# Patient Record
Sex: Female | Born: 1995
Health system: Southern US, Community
[De-identification: ages and names within clinical notes are randomized; demographics above are authoritative.]

## PROBLEM LIST (undated history)

## (undated) ENCOUNTER — Inpatient Hospital Stay (HOSPITAL_COMMUNITY): Payer: Self-pay

## (undated) DIAGNOSIS — F329 Major depressive disorder, single episode, unspecified: Secondary | ICD-10-CM

## (undated) DIAGNOSIS — IMO0002 Reserved for concepts with insufficient information to code with codable children: Secondary | ICD-10-CM

## (undated) DIAGNOSIS — F32A Depression, unspecified: Secondary | ICD-10-CM

## (undated) DIAGNOSIS — N730 Acute parametritis and pelvic cellulitis: Secondary | ICD-10-CM

## (undated) HISTORY — DX: Major depressive disorder, single episode, unspecified: F32.9

## (undated) HISTORY — DX: Acute parametritis and pelvic cellulitis: N73.0

## (undated) HISTORY — PX: NO PAST SURGERIES: SHX2092

## (undated) HISTORY — DX: Depression, unspecified: F32.A

## (undated) HISTORY — DX: Reserved for concepts with insufficient information to code with codable children: IMO0002

---
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
## 2014-02-09 ENCOUNTER — Inpatient Hospital Stay (HOSPITAL_COMMUNITY)
Admission: AD | Admit: 2014-02-09 | Discharge: 2014-02-10 | Disposition: A | Discharge status: Home / Self Care | Payer: Medicaid Other | Source: Ambulatory Visit | Attending: Obstetrics & Gynecology | Admitting: Obstetrics & Gynecology

## 2014-02-09 DIAGNOSIS — O479 False labor, unspecified: Secondary | ICD-10-CM | POA: Insufficient documentation

## 2014-02-10 ENCOUNTER — Encounter (HOSPITAL_COMMUNITY): Payer: Self-pay

## 2014-02-10 NOTE — MAU Note (Signed)
Prenatal care in [place], records sent to [facility] so she can delivery here. Contractions. Positive fetal movement. Denies vaginal bleeding or LOF. States GBS negative in office.

## 2014-02-10 NOTE — Discharge Instructions (Signed)
Braxton Hicks Contractions Pregnancy is commonly associated with contractions of the uterus throughout the pregnancy. Towards the end of pregnancy (32 to 34 weeks), these contractions (Braxton Hicks) can develop more often and may become more forceful. This is not true labor because these contractions do not result in opening (dilatation) and thinning of the cervix. They are sometimes difficult to tell apart from true labor because these contractions can be forceful and people have different pain tolerances. You should not feel embarrassed if you go to the hospital with false labor. Sometimes, the only way to tell if you are in true labor is for your caregiver to follow the changes in the cervix. How to tell the difference between true and false labor:  False labor.  The contractions of false labor are usually shorter, irregular and not as hard as those of true labor.  They are often felt in the front of the lower abdomen and in the groin.  They may leave with walking around or changing positions while lying down.  They get weaker and are shorter lasting as time goes on.  These contractions are usually irregular.  They do not usually become progressively stronger, regular and closer together as with true labor.  True labor.  Contractions in true labor last 30 to 70 seconds, become very regular, usually become more intense, and increase in frequency.  They do not go away with walking.  The discomfort is usually felt in the top of the uterus and spreads to the lower abdomen and low back.  True labor can be determined by your caregiver with an exam. This will show that the cervix is dilating and getting thinner. If there are no prenatal problems or other health problems associated with the pregnancy, it is completely safe to be sent home with false labor and await the onset of true labor. HOME CARE INSTRUCTIONS   Keep up with your usual exercises and instructions.  Take medications as  directed.  Keep your regular prenatal appointment.  Eat and drink lightly if you think you are going into labor.  If BH contractions are making you uncomfortable:  Change your activity position from lying down or resting to walking/walking to resting.  Sit and rest in a tub of warm water.  Drink 2 to 3 glasses of water. Dehydration may cause B-H contractions.  Do slow and deep breathing several times an hour. SEEK IMMEDIATE MEDICAL CARE IF:   Your contractions continue to become stronger, more regular, and closer together.  You have a gushing, burst or leaking of fluid from the vagina.  An oral temperature above 102 F (38.9 C) develops.  You have passage of blood-tinged mucus.  You develop vaginal bleeding.  You develop continuous belly (abdominal) pain.  You have low back pain that you never had before.  You feel the baby's head pushing down causing pelvic pressure.  The baby is not moving as much as it used to. Document Released: 09/11/2005 Document Revised: 12/04/2011 Document Reviewed: 06/23/2013 ExitCare Patient Information 2014 [employer].  Fetal Movement Counts Patient Name: __________________________________________________ Patient Due Date: ____________________ Performing a fetal movement count is highly recommended in high-risk pregnancies, but it is good for every pregnant woman to do. Your caregiver may ask you to start counting fetal movements at 28 weeks of the pregnancy. Fetal movements often increase:  After eating a full meal.  After physical activity.  After eating or drinking something sweet or cold.  At rest. Pay attention to when you feel   the baby is most active. This will help you notice a pattern of your baby's sleep and wake cycles and what factors contribute to an increase in fetal movement. It is important to perform a fetal movement count at the same time each day when your baby is normally most active.  HOW TO COUNT FETAL  MOVEMENTS 1. Find a quiet and comfortable area to sit or lie down on your left side. Lying on your left side provides the best blood and oxygen circulation to your baby. 2. Write down the day and time on a sheet of paper or in a journal. 3. Start counting kicks, flutters, swishes, rolls, or jabs in a 2 hour period. You should feel at least 10 movements within 2 hours. 4. If you do not feel 10 movements in 2 hours, wait 2 3 hours and count again. Look for a change in the pattern or not enough counts in 2 hours. SEEK MEDICAL CARE IF:  You feel less than 10 counts in 2 hours, tried twice.  There is no movement in over an hour.  The pattern is changing or taking longer each day to reach 10 counts in 2 hours.  You feel the baby is not moving as he or she usually does. Date: ____________ Movements: ____________ Start time: ____________ Finish time: ____________  Date: ____________ Movements: ____________ Start time: ____________ Finish time: ____________ Date: ____________ Movements: ____________ Start time: ____________ Finish time: ____________ Date: ____________ Movements: ____________ Start time: ____________ Finish time: ____________ Date: ____________ Movements: ____________ Start time: ____________ Finish time: ____________ Date: ____________ Movements: ____________ Start time: ____________ Finish time: ____________ Date: ____________ Movements: ____________ Start time: ____________ Finish time: ____________ Date: ____________ Movements: ____________ Start time: ____________ Finish time: ____________  Date: ____________ Movements: ____________ Start time: ____________ Finish time: ____________ Date: ____________ Movements: ____________ Start time: ____________ Finish time: ____________ Date: ____________ Movements: ____________ Start time: ____________ Finish time: ____________ Date: ____________ Movements: ____________ Start time: ____________ Finish time: ____________ Date: ____________  Movements: ____________ Start time: ____________ Finish time: ____________ Date: ____________ Movements: ____________ Start time: ____________ Finish time: ____________ Date: ____________ Movements: ____________ Start time: ____________ Finish time: ____________  Date: ____________ Movements: ____________ Start time: ____________ Finish time: ____________ Date: ____________ Movements: ____________ Start time: ____________ Finish time: ____________ Date: ____________ Movements: ____________ Start time: ____________ Finish time: ____________ Date: ____________ Movements: ____________ Start time: ____________ Finish time: ____________ Date: ____________ Movements: ____________ Start time: ____________ Finish time: ____________ Date: ____________ Movements: ____________ Start time: ____________ Finish time: ____________ Date: ____________ Movements: ____________ Start time: ____________ Finish time: ____________  Date: ____________ Movements: ____________ Start time: ____________ Finish time: ____________ Date: ____________ Movements: ____________ Start time: ____________ Finish time: ____________ Date: ____________ Movements: ____________ Start time: ____________ Finish time: ____________ Date: ____________ Movements: ____________ Start time: ____________ Finish time: ____________ Date: ____________ Movements: ____________ Start time: ____________ Finish time: ____________ Date: ____________ Movements: ____________ Start time: ____________ Finish time: ____________ Date: ____________ Movements: ____________ Start time: ____________ Finish time: ____________  Date: ____________ Movements: ____________ Start time: ____________ Finish time: ____________ Date: ____________ Movements: ____________ Start time: ____________ Finish time: ____________ Date: ____________ Movements: ____________ Start time: ____________ Finish time: ____________ Date: ____________ Movements: ____________ Start time:  ____________ Finish time: ____________ Date: ____________ Movements: ____________ Start time: ____________ Finish time: ____________ Date: ____________ Movements: ____________ Start time: ____________ Finish time: ____________ Date: ____________ Movements: ____________ Start time: ____________ Finish time: ____________  Date: ____________ Movements: ____________ Start time: ____________ Finish time: ____________ Date: ____________ Movements: ____________ Start   time: ____________ Finish time: ____________ Date: ____________ Movements: ____________ Start time: ____________ Finish time: ____________ Date: ____________ Movements: ____________ Start time: ____________ Finish time: ____________ Date: ____________ Movements: ____________ Start time: ____________ Finish time: ____________ Date: ____________ Movements: ____________ Start time: ____________ Finish time: ____________ Date: ____________ Movements: ____________ Start time: ____________ Finish time: ____________  Date: ____________ Movements: ____________ Start time: ____________ Finish time: ____________ Date: ____________ Movements: ____________ Start time: ____________ Finish time: ____________ Date: ____________ Movements: ____________ Start time: ____________ Finish time: ____________ Date: ____________ Movements: ____________ Start time: ____________ Finish time: ____________ Date: ____________ Movements: ____________ Start time: ____________ Finish time: ____________ Date: ____________ Movements: ____________ Start time: ____________ Finish time: ____________ Date: ____________ Movements: ____________ Start time: ____________ Finish time: ____________  Date: ____________ Movements: ____________ Start time: ____________ Finish time: ____________ Date: ____________ Movements: ____________ Start time: ____________ Finish time: ____________ Date: ____________ Movements: ____________ Start time: ____________ Finish time: ____________ Date:  ____________ Movements: ____________ Start time: ____________ Finish time: ____________ Date: ____________ Movements: ____________ Start time: ____________ Finish time: ____________ Date: ____________ Movements: ____________ Start time: ____________ Finish time: ____________ Document Released: 10/11/2006 Document Revised: 08/28/2012 Document Reviewed: 07/08/2012 Exit[employer] Patient Information 2014 [employer].  

## 2014-02-14 ENCOUNTER — Encounter (HOSPITAL_COMMUNITY): Payer: Self-pay | Admitting: *Deleted

## 2014-02-14 ENCOUNTER — Inpatient Hospital Stay (HOSPITAL_COMMUNITY)
Admission: AD | Admit: 2014-02-14 | Discharge: 2014-02-14 | Disposition: A | Discharge status: Home / Self Care | Payer: Medicaid Other | Source: Ambulatory Visit | Attending: Obstetrics & Gynecology | Admitting: Obstetrics & Gynecology

## 2014-02-14 DIAGNOSIS — O479 False labor, unspecified: Secondary | ICD-10-CM | POA: Insufficient documentation

## 2014-02-14 NOTE — MAU Note (Signed)
Thressa Sheller CNM notified of pt's repeat SVE with no change. Pt stable for d/c home.

## 2014-02-14 NOTE — Discharge Instructions (Signed)
Natural Childbirth °Natural childbirth is going through labor and delivery without any drugs to relieve pain. You also do not use fetal monitors, have a cesarean delivery, or get a sugical cut to enlarge the vaginal opening (episiotomy). With the help of a birthing professional (midwife), you will direct your own labor and delivery as you choose. °Many women chose natural childbirth because they feel more in control and in touch with their labor and delivery. They are also concerned about the medications affecting themselves and the baby. °Pregnant women with a high risk pregnancy should not attempt natural childbirth. It is better to deliver the infant in a hospital if an emergency situation arises. Sometimes, the caregiver has to intervene for the health and safety of the mother and infant. °TWO TECHNIQUES FOR NATURAL CHILDBIRTH:  °· The Lamaze method. This method teaches women that having a baby is normal, healthy, and natural. It also teaches the mother to take a neutral position regarding pain medication and anesthesia and to make an informed decision if and when it is right for them. °· The Bradley method (also called husband coached birth). This method teaches the father to be the birth coach and stresses a natural approach. It also encourages exercise and a balanced diet with good nutrition. The exercises teach relaxation and deep breathing techniques. However, there are also classes to prepare the parents for an emergency situation that may occur. °METHODS OF DEALING WITH LABOR PAIN AND DELIVERY: °· Meditation. °· Yoga. °· Hypnosis. °· Acupuncture. °· Massage. °· Changing positions (walking, rocking, showering, leaning on birth balls). °· Lying in warm water or a jacuzzi. °· Find an activity that keeps your mind off of the labor pain. °· Listen to soft music. °· Visual imagery (focus on a particular object). °BEFORE GOING INTO LABOR °· Be sure you and your spouse/partner are in agreement to have natural  childbirth. °· Decide if your caregiver or a midwife will deliver your baby. °· Decide if you will have your baby in the hospital, birthing center, or at home. °· If you have children, make plans to have someone to take care of them when you go to the hospital. °· Know the distance and the time it takes to go to the delivery center. Make a dry run to be sure. °· Have a bag packed with a night gown, bathrobe, and toiletries ready to take when you go into labor. °· Keep phone numbers of your family and friends handy if you need to call someone when you go into labor. °· Your spouse or partner should go to all the teaching classes. °· Talk with your caregiver about the possibility of a medical emergency and what will happen if that occurs. °ADVANTAGES OF NATURAL CHILDBIRTH °· You are in control of your labor and delivery. °· It is safe. °· There are no medications or anesthetics that may affect you and the fetus. °· There are no invasive procedures such as an episiotomy. °· You and your partner will work together, which can increase your bond. °· Meditation, yoga, massage, and breathing exercises can be learned while pregnant and help you when you are in labor and at delivery. °· In most delivery centers, the family and friends can be involved in the labor and delivery process. °DISADVANTAGES OF NATURAL CHILDBIRTH °· You will experience pain during your labor and delivery. °· The methods of helping relieve your labor pains may not work for you. °· You may feel embarrassed, disappointed, and like a failure   if you decide to change your mind during labor and not have natural childbirth. °AFTER THE DELIVERY °· You will be very tired. °· You will be uncomfortable because of your uterus contracting. You will feel soreness around the vagina. °· You may feel cold and shaky.This is a natural reaction. °· You will be excited, overwhelmed, accomplished, and proud to be a mother. °HOME CARE INSTRUCTIONS  °· Follow the advice and  instructions of your caregiver. °· Follow the instructions of your natural childbirth instructor (Lamaze or Bradley Method). °Document Released: 08/24/2008 Document Revised: 12/04/2011 Document Reviewed: 05/19/2013 °ExitCare® Patient Information ©2014 [employer]. ° °

## 2014-02-14 NOTE — MAU Note (Signed)
Contractions since 2100. Denies bleeding or leaking fld.

## 2014-02-14 NOTE — Progress Notes (Signed)
Dr Birdie Sons notified of pt's admission and status. Will see pt

## 2014-02-14 NOTE — Progress Notes (Signed)
Written and verbal d/c instructions given by Joana Reamer RN and understanding voiced.

## 2014-02-19 ENCOUNTER — Other Ambulatory Visit (HOSPITAL_COMMUNITY): Payer: Self-pay | Admitting: Primary Care

## 2014-02-19 DIAGNOSIS — O288 Other abnormal findings on antenatal screening of mother: Secondary | ICD-10-CM

## 2014-02-20 ENCOUNTER — Telehealth (HOSPITAL_COMMUNITY): Payer: Self-pay | Admitting: *Deleted

## 2014-02-20 LAB — OB RESULTS CONSOLE GBS: GBS: NEGATIVE

## 2014-02-20 NOTE — Telephone Encounter (Signed)
,  p

## 2014-02-25 ENCOUNTER — Ambulatory Visit (HOSPITAL_COMMUNITY): Admission: RE | Admit: 2014-02-25 | Payer: Medicaid Other | Source: Ambulatory Visit

## 2014-02-28 ENCOUNTER — Inpatient Hospital Stay (HOSPITAL_COMMUNITY)
Admission: RE | Admit: 2014-02-28 | Discharge: 2014-03-02 | DRG: 775 | Disposition: A | Discharge status: Home / Self Care | Payer: Medicaid Other | Source: Ambulatory Visit | Attending: Obstetrics & Gynecology | Admitting: Obstetrics & Gynecology

## 2014-02-28 ENCOUNTER — Encounter (HOSPITAL_COMMUNITY): Payer: Self-pay

## 2014-02-28 VITALS — BP 112/76 | HR 72 | Temp 98.2°F | Resp 16 | Ht 62.0 in | Wt 148.0 lb

## 2014-02-28 DIAGNOSIS — O328XX0 Maternal care for other malpresentation of fetus, not applicable or unspecified: Secondary | ICD-10-CM | POA: Diagnosis present

## 2014-02-28 DIAGNOSIS — Z3490 Encounter for supervision of normal pregnancy, unspecified, unspecified trimester: Secondary | ICD-10-CM

## 2014-02-28 DIAGNOSIS — O480 Post-term pregnancy: Secondary | ICD-10-CM | POA: Diagnosis present

## 2014-02-28 LAB — CBC
HEMATOCRIT: 36 % (ref 36.0–46.0)
Hemoglobin: 12.4 g/dL (ref 12.0–15.0)
MCH: 31.4 pg (ref 26.0–34.0)
MCHC: 34.4 g/dL (ref 30.0–36.0)
MCV: 91.1 fL (ref 78.0–100.0)
Platelets: 138 10*3/uL — ABNORMAL LOW (ref 150–400)
RBC: 3.95 MIL/uL (ref 3.87–5.11)
RDW: 13.2 % (ref 11.5–15.5)
WBC: 11.7 10*3/uL — AB (ref 4.0–10.5)

## 2014-02-28 LAB — TYPE AND SCREEN
ABO/RH(D): O POS
Antibody Screen: NEGATIVE

## 2014-02-28 LAB — RPR

## 2014-02-28 MED ORDER — ONDANSETRON HCL 4 MG/2ML IJ SOLN: 4.0000 mg | Freq: Four times a day (QID) | INTRAMUSCULAR | Status: DC | PRN

## 2014-02-28 MED ORDER — LACTATED RINGERS IV SOLN: 500.0000 mL | INTRAVENOUS | Status: DC | PRN

## 2014-02-28 MED ORDER — FLEET ENEMA 7-19 GM/118ML RE ENEM: 1.0000 | ENEMA | RECTAL | Status: DC | PRN

## 2014-02-28 MED ORDER — ONDANSETRON HCL 4 MG/2ML IJ SOLN: 4.0000 mg | INTRAMUSCULAR | Status: DC | PRN

## 2014-02-28 MED ORDER — LANOLIN HYDROUS EX OINT
TOPICAL_OINTMENT | CUTANEOUS | Status: DC | PRN
Start: 2014-02-28 — End: 2014-03-02

## 2014-02-28 MED ORDER — LACTATED RINGERS IV SOLN
INTRAVENOUS | Status: DC
  Administered 2014-02-28: 125 mL/h via INTRAVENOUS

## 2014-02-28 MED ORDER — SENNOSIDES-DOCUSATE SODIUM 8.6-50 MG PO TABS
2.0000 | ORAL_TABLET | ORAL | Status: DC
  Administered 2014-02-28 – 2014-03-01 (×2): 2 via ORAL
  Filled 2014-02-28 (×2): qty 2

## 2014-02-28 MED ORDER — CITRIC ACID-SODIUM CITRATE 334-500 MG/5ML PO SOLN: 30.0000 mL | ORAL | Status: DC | PRN

## 2014-02-28 MED ORDER — ACETAMINOPHEN 325 MG PO TABS: 650.0000 mg | ORAL_TABLET | ORAL | Status: DC | PRN

## 2014-02-28 MED ORDER — DIPHENHYDRAMINE HCL 25 MG PO CAPS: 25.0000 mg | ORAL_CAPSULE | Freq: Four times a day (QID) | ORAL | Status: DC | PRN

## 2014-02-28 MED ORDER — PRENATAL MULTIVITAMIN CH
1.0000 | ORAL_TABLET | Freq: Every day | ORAL | Status: DC
  Administered 2014-03-01 – 2014-03-02 (×2): 1 via ORAL
  Filled 2014-02-28 (×2): qty 1

## 2014-02-28 MED ORDER — LACTATED RINGERS IV BOLUS (SEPSIS): 300.0000 mL | Freq: Once | INTRAVENOUS | Status: DC

## 2014-02-28 MED ORDER — TERBUTALINE SULFATE 1 MG/ML IJ SOLN: 0.2500 mg | Freq: Once | INTRAMUSCULAR | Status: DC | PRN

## 2014-02-28 MED ORDER — LACTATED RINGERS IV SOLN
INTRAVENOUS | Status: DC
  Administered 2014-02-28: 150 mL/h via INTRAUTERINE

## 2014-02-28 MED ORDER — OXYTOCIN 40 UNITS IN LACTATED RINGERS INFUSION - SIMPLE MED
1.0000 m[IU]/min | INTRAVENOUS | Status: DC
  Administered 2014-02-28: 2 m[IU]/min via INTRAVENOUS
  Filled 2014-02-28: qty 1000

## 2014-02-28 MED ORDER — SIMETHICONE 80 MG PO CHEW: 80.0000 mg | CHEWABLE_TABLET | ORAL | Status: DC | PRN

## 2014-02-28 MED ORDER — ZOLPIDEM TARTRATE 5 MG PO TABS: 5.0000 mg | ORAL_TABLET | Freq: Every evening | ORAL | Status: DC | PRN

## 2014-02-28 MED ORDER — OXYCODONE-ACETAMINOPHEN 5-325 MG PO TABS
1.0000 | ORAL_TABLET | ORAL | Status: DC | PRN
  Administered 2014-03-01 (×4): 1 via ORAL
  Filled 2014-02-28 (×4): qty 1

## 2014-02-28 MED ORDER — OXYTOCIN BOLUS FROM INFUSION
500.0000 mL | INTRAVENOUS | Status: DC
  Administered 2014-02-28: 500 mL via INTRAVENOUS

## 2014-02-28 MED ORDER — IBUPROFEN 600 MG PO TABS: 600.0000 mg | ORAL_TABLET | Freq: Four times a day (QID) | ORAL | Status: DC | PRN

## 2014-02-28 MED ORDER — BENZOCAINE-MENTHOL 20-0.5 % EX AERO: 1.0000 "application " | INHALATION_SPRAY | CUTANEOUS | Status: DC | PRN

## 2014-02-28 MED ORDER — ONDANSETRON HCL 4 MG PO TABS: 4.0000 mg | ORAL_TABLET | ORAL | Status: DC | PRN

## 2014-02-28 MED ORDER — TETANUS-DIPHTH-ACELL PERTUSSIS 5-2.5-18.5 LF-MCG/0.5 IM SUSP: 0.5000 mL | Freq: Once | INTRAMUSCULAR | Status: DC

## 2014-02-28 MED ORDER — OXYCODONE-ACETAMINOPHEN 5-325 MG PO TABS: 1.0000 | ORAL_TABLET | ORAL | Status: DC | PRN

## 2014-02-28 MED ORDER — DIBUCAINE 1 % RE OINT: 1.0000 "application " | TOPICAL_OINTMENT | RECTAL | Status: DC | PRN

## 2014-02-28 MED ORDER — DEXTROSE 5 % IN LACTATED RINGERS IV BOLUS: 300.0000 mL | Freq: Once | INTRAVENOUS | Status: DC

## 2014-02-28 MED ORDER — WITCH HAZEL-GLYCERIN EX PADS
1.0000 | MEDICATED_PAD | CUTANEOUS | Status: DC | PRN
Start: 2014-02-28 — End: 2014-03-02

## 2014-02-28 MED ORDER — MEASLES, MUMPS & RUBELLA VAC ~~LOC~~ INJ
0.5000 mL | INJECTION | Freq: Once | SUBCUTANEOUS | Status: DC
  Filled 2014-02-28: qty 0.5

## 2014-02-28 MED ORDER — LIDOCAINE HCL (PF) 1 % IJ SOLN
30.0000 mL | INTRAMUSCULAR | Status: DC | PRN
Start: 2014-02-28 — End: 2014-02-28
  Filled 2014-02-28: qty 30

## 2014-02-28 MED ORDER — IBUPROFEN 600 MG PO TABS
600.0000 mg | ORAL_TABLET | Freq: Four times a day (QID) | ORAL | Status: DC
  Administered 2014-02-28 – 2014-03-02 (×8): 600 mg via ORAL
  Filled 2014-02-28 (×8): qty 1

## 2014-02-28 MED ORDER — OXYTOCIN 40 UNITS IN LACTATED RINGERS INFUSION - SIMPLE MED: 62.5000 mL/h | INTRAVENOUS | Status: DC

## 2014-02-28 NOTE — Progress Notes (Signed)
Pt insists on lying on her back.  RN explained to pt how lying on back decreases oxygen supply to baby (using pictures and verbally).  Pt still wants to lie on back.  Pt told if fhr looks concerning she will need to return to one side or the other.  Pt alos told that raising HOB would help or that she could set in chair

## 2014-02-28 NOTE — Progress Notes (Signed)
Alexis Gordon is a 18 y.o. G2P1001 at [redacted]w[redacted]d admitted for induction of labor due to postdates.  Subjective:  Feeling more pain with ctx. +FM  Objective: BP 129/86  Pulse 83  Temp(Src) 97.8 F (36.6 C) (Oral)  Resp 16  Ht 5\' 2"  (1.575 m)  Wt 67.132 kg (148 lb)  BMI 27.06 kg/m2  LMP 05/17/2013      FHT:  FHR: 130 bpm, variability: moderate,  accelerations:  Present,  decelerations:  Absent UC:   regular, every 1-3 minutes SVE:   Dilation: 4 Effacement (%): 50 Station: -2 Exam by:: Dr Reola Calkins  Labs: Lab Results  Component Value Date   WBC 11.7* 02/28/2014   HGB 12.4 02/28/2014   HCT 36.0 02/28/2014   MCV 91.1 02/28/2014   PLT 138* 02/28/2014    Assessment / Plan: Induction of labor due to postterm,  progressing well on pitocin  Labor: AROM performed with return of clear fluid.  cont increasing pitocin as tolerated Fetal Wellbeing:  Category I Pain Control:  Labor support without medications I/D:  n/a Anticipated MOD:  NSVD  Orma Cheetham L Keilon Ressel 02/28/2014, 3:01 PM
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
## 2014-02-28 NOTE — Progress Notes (Signed)
Patient ID: Alexis Gordon, female   DOB: 07/02/1996, 18 y.o.   MRN: [id number]  Pt having variables after ROM Cervix 5/90/-2 No cord felt. IUPC with amnioinfusion + scalp stim with exam.  Lesly Dukes

## 2014-02-28 NOTE — Progress Notes (Signed)
Alexis Gordon is a 18 y.o. G2P1001 at [redacted]w[redacted]d admitted for postdates IOL  Subjective:  Starting to hurt over the last hour.  +FM.    Objective: BP 127/88  Pulse 81  Temp(Src) 97.8 F (36.6 C) (Oral)  Resp 16  Ht 5\' 2"  (1.575 m)  Wt 67.132 kg (148 lb)  BMI 27.06 kg/m2  LMP 05/17/2013      FHT:  FHR: 125 bpm, variability: moderate,  accelerations:  Present,  decelerations:  Absent UC:   irregular, every 1-4 minutes SVE:   Dilation: 3 Effacement (%): 50 Station: -3 Exam by:: Dr Reola Calkins  Labs: Lab Results  Component Value Date   WBC 11.7* 02/28/2014   HGB 12.4 02/28/2014   HCT 36.0 02/28/2014   MCV 91.1 02/28/2014   PLT 138* 02/28/2014    Assessment / Plan: IOL for postdates. cont increasing pitocin  Labor: cont increasing pitocin and then plan to AROM when able Fetal Wellbeing:  Category I Pain Control:  Labor support without medications I/D:  n/a Anticipated MOD:  NSVD  Alexis Gordon 02/28/2014, 1:36 PM

## 2014-02-28 NOTE — H&P (Signed)
Attestation of Attending Supervision of Fellow: Evaluation and management procedures were performed by the Fellow under my supervision and collaboration.  I have reviewed the Fellow's note and chart, and I agree with the management and plan.    

## 2014-03-01 NOTE — Lactation Note (Signed)
This note was copied from the chart of Alexis Gordon. Lactation Consultation Note Initial visit at 24 hours of age. Mom reports giving a bottle of formula because she had visitors and wanted to.  Discussed benefits of exclusively breast feeding during the 1st several weeks. Mom did not successfully breastfeed her older child.  Baby has had 11 feeding with 3 voids and 5 stools and latch scores of 7.  Nipples are puffy and mom complains right is more sore. No skin breakdown noted, encouraged EBM to be applied to nipple for comfort.  Demonstrated hand expression with large amounts of flowing colostrum.  Encouraged mom to call for assist with latch to help assess where pain is coming from. Mom has DEBP and reports using it some as a preference.  Mom reports using a nipple shield yesterday, but lost it.  Nipples are erect and encouraged mom to call for assist if she feels she wants to try nipple shield again.  [facility] LC resources given and discussed.  Encouraged to feed with early cues on demand.  Early newborn behavior discussed. Mom to call for assist as needed.    Patient Name: Alexis Gordon [id number] Date: 03/01/2014 Reason for consult: Initial assessment   Maternal Data Has patient been taught Hand Expression?: Yes Does the patient have breastfeeding experience prior to this delivery?: Yes  Feeding Feeding Type: Formula Nipple Type: Slow - flow Length of feed: 10 min  LATCH Score/Interventions                Intervention(s): Breastfeeding basics reviewed     Lactation Tools Discussed/Used Tools: Nipple Dorris Carnes;Pump   Consult Status Consult Status: Follow-up Date: 03/02/14 Follow-up type: In-patient    Alexis Gordon 03/01/2014, 5:49 PM

## 2014-03-01 NOTE — Progress Notes (Signed)

## 2014-03-01 NOTE — Progress Notes (Signed)
Post Partum Day #1 Subjective: no complaints, up ad lib and tolerating PO ; breastfeeding going well; desires Mirena for contraception  Objective: Blood pressure 111/74, pulse 87, temperature 97.9 F (36.6 C), temperature source Oral, resp. rate 19, height 5\' 2"  (1.575 m), weight 67.132 kg (148 lb), last menstrual period 05/17/2013, SpO2 100.00%, unknown if currently breastfeeding.  Physical Exam:  General: alert, cooperative and no distress Heart: RRR Lungs: nl effort Lochia: appropriate Uterine Fundus: firm DVT Evaluation: No evidence of DVT seen on physical exam.   Recent Labs  02/28/14 0835  HGB 12.4  HCT 36.0    Assessment/Plan: Plan for discharge tomorrow AM 03/02/14   LOS: 1 day   Alexis Gordon 03/01/2014, 7:40 AM

## 2014-03-01 NOTE — Lactation Note (Signed)
This note was copied from the chart of Alexis Gordon. Lactation Consultation Note Follow up care at 28 hours, per [facility] RN, mom wants a nipple shield.  MOm reports just finishing a feeding and has visitors coming soon and plans to bottle feed when they are there.  Mom's preference is to bottle feed formula some regardless of recommendations.   Mom complains of nipple soreness with hand pump on right nipple, bruised and pink.  Colostrum easily hand pumped and mom feels encouraged she is pumping milk, encouraged her to use prior to formula if she continues to bottle feed some.  Fitted for a #20 nipple shield and seemingly good fit, mom still complains of nipple pain with latch.  Fitted for a #24 with good fit and mom reports less pain with latch on.  Mom is assisted in football hold, baby is able to latch well with wide flanged lips and mom can feel when baby has poor latch.  Suggested positional changes and holding baby on upper shoulders to bring to breast.  Mom needs reinforcement on learning this visit.  Small amount of colostrum in nipple shield with audible swallows heard.  Encouraged mom to continue to pump if she is using nipple shield and if she offers formula in bottles. Encouraged mom to burp baby with feedings also.  Mom still seems unsure of feeding plans.  Mom to call for assist as needed.      Patient Name: Alexis Gordon [id number] Date: 03/01/2014 Reason for consult: Follow-up assessment;Breast/nipple pain;Difficult latch   Maternal Data    Feeding Feeding Type: Breast Fed Length of feed:  (10 minutes observed)  LATCH Score/Interventions Latch: Grasps breast easily, tongue down, lips flanged, rhythmical sucking.  Audible Swallowing: A few with stimulation Intervention(s): Hand expression;Skin to skin  Type of Nipple: Flat  Comfort (Breast/Nipple): Filling, red/small blisters or bruises, mild/mod discomfort  Problem noted: Mild/Moderate discomfort Interventions (Mild/moderate  discomfort): Post-pump;Hand expression Interventions (Severe discomfort): Double electric pum  Hold (Positioning): Assistance needed to correctly position infant at breast and maintain latch. Intervention(s): Skin to skin;Position options;Support Pillows;Breastfeeding basics reviewed  LATCH Score: 6  Lactation Tools Discussed/Used Tools: Nipple Shields Nipple shield size: 24;20   Consult Status Consult Status: Follow-up Date: 03/02/14 Follow-up type: In-patient    Alexis Gordon 03/01/2014, 8:31 PM

## 2014-03-02 DIAGNOSIS — O480 Post-term pregnancy: Secondary | ICD-10-CM

## 2014-03-02 DIAGNOSIS — O328XX0 Maternal care for other malpresentation of fetus, not applicable or unspecified: Secondary | ICD-10-CM

## 2014-03-02 MED ORDER — IBUPROFEN 600 MG PO TABS: 600.0000 mg | ORAL_TABLET | Freq: Four times a day (QID) | ORAL | Status: DC

## 2014-03-02 NOTE — Progress Notes (Cosign Needed)
Post Partum Day 2 Subjective: up ad lib, voiding, tolerating PO and minimal pain. Pt has not been passing gas and reports lack of bowel movement since Saturday. Pt also endorses mild cramping in the lower abdomen when walking around and a slight bloody discharge with strong movements. Pt has been bottle and breast feeding but is having a difficult time with breast feeding as there is moderate pain. Nurses were concerned about patient slightly engorged.   Objective: Blood pressure 112/76, pulse 72, temperature 98.2 F (36.8 C), temperature source Oral, resp. rate 16, height 5\' 2"  (1.575 m), weight 67.132 kg (148 lb), last menstrual period 05/17/2013, SpO2 100.00%, unknown if currently breastfeeding.  Physical Exam:  General: fatigued and no distress Lochia: appropriate Uterine Fundus: firm Abdomen: Bowel sounds present in all 4 quadrants.  Incision: N/A DVT Evaluation: No evidence of DVT seen on physical exam. Negative Homan's sign. No cords or calf tenderness. No significant calf/ankle edema.   Recent Labs  02/28/14 0835  HGB 12.4  HCT 36.0    Assessment/Plan: Discharge home Breast and bottle feeding Discussed IUD vs Implanon with patient.   LOS: 2 days   Bing Plume 03/02/2014, 7:42 AM

## 2014-03-02 NOTE — Discharge Summary (Signed)
Obstetric Discharge Summary Reason for Admission: induction of labor for postdates Prenatal Procedures: NST Intrapartum Procedures: spontaneous vaginal delivery Postpartum Procedures: none Complications-Operative and Postpartum: none Hemoglobin  Date Value Ref Range Status  02/28/2014 12.4  12.0 - 15.0 g/dL Final     HCT  Date Value Ref Range Status  02/28/2014 36.0  36.0 - 46.0 % Final   Delivery Note At 3:58 PM a viable female was delivered via Vaginal, Spontaneous Delivery (Presentation: Right Occiput Anterior).  APGAR: 9, 9; weight TBD.   Placenta status: Intact, Spontaneous.  Cord: 3 vessels with the following complications: None.    Anesthesia: None  Episiotomy: None Lacerations: None Suture Repair: na Est. Blood Loss (mL): 250  Mom to postpartum.  Baby to Couplet care / Skin to Skin.  Pt pushed with good maternal effort to deliver a liveborn female via NSVD with spontaneous cry.  Compound presentation of posterior arm. Delivered posterior shoulder first. Baby placed on maternal abdomen.  Delayed cord clamping performed.  Cord cut by FOB.  Placenta delivered intact with 3V cord via traction and pitocin.  no tears. No complications.  Mom and baby to postpartum.    Physical Exam:  General: alert, cooperative and no distress Lochia: appropriate Uterine Fundus: firm Incision: na DVT Evaluation: No evidence of DVT seen on physical exam. No significant calf/ankle edema.  Discharge Diagnoses: Term Pregnancy-delivered  Discharge Information: Date: 03/02/2014 Activity: pelvic rest Diet: routine Medications: None and Ibuprofen Condition: stable Instructions: refer to practice specific booklet Discharge to: home Follow-up Information   Follow up with [facility] Department. Schedule an appointment as soon as possible for a visit in 6 weeks. (Postpartum check up)       Newborn Data: Live born female  Birth Weight: 8 lb 5 oz (3770 g) APGAR: 9, 9  Home with  mother.  Pt presented for IOL for postdates. She progressed well and pushed to deliver a liveborn female via NSVD. Postpartum care was uncomplicated. She is breast feeding and desires a Mirena.  She will f/u at the [facility] for that.    Alexis Gordon L Denim Start 03/02/2014, 10:33 AM

## 2014-03-02 NOTE — Discharge Summary (Signed)
Attestation of Attending Supervision of Advanced Practitioner (CNM/NP): Evaluation and management procedures were performed by the Advanced Practitioner under my supervision and collaboration.  I have reviewed the Advanced Practitioner's note and chart, and I agree with the management and plan.  Ceola Para 03/02/2014 2:05 PM   

## 2014-03-02 NOTE — Discharge Instructions (Signed)

## 2014-03-02 NOTE — Progress Notes (Signed)
Ur chart review completed.  

## 2014-07-27 ENCOUNTER — Encounter (HOSPITAL_COMMUNITY): Payer: Self-pay

## 2014-09-11 ENCOUNTER — Encounter: Payer: Self-pay | Admitting: Licensed Clinical Social Worker

## 2014-09-15 ENCOUNTER — Institutional Professional Consult (permissible substitution): Payer: Self-pay | Admitting: Pediatrics

## 2014-09-23 ENCOUNTER — Institutional Professional Consult (permissible substitution): Payer: Self-pay | Admitting: Pediatrics

## 2014-09-23 ENCOUNTER — Encounter: Payer: Self-pay | Admitting: Pediatrics

## 2014-09-23 NOTE — Progress Notes (Signed)
Pre-Visit Planning  Last STI screen: Due for GC/CT screening Pertinent Labs:  Component     Latest Ref Rng 02/28/2014  WBC     4.0 - 10.5 K/uL 11.7 (H)  RBC     3.87 - 5.11 MIL/uL 3.95  Hemoglobin     12.0 - 15.0 g/dL 16.112.4  HCT     09.636.0 - 04.546.0 % 36.0  MCV     78.0 - 100.0 fL 91.1  MCH     26.0 - 34.0 pg 31.4  MCHC     30.0 - 36.0 g/dL 40.934.4  RDW     81.111.5 - 91.415.5 % 13.2  Platelets     150 - 400 K/uL 138 (L)  RPR     NON REAC NON REAC   Chart Review:   18 yo female referred by Dr. Lubertha SouthProse s/p NSVD 03/02/14, with IUD in place but interested in placement of impant.  Previous Psych Screenings:  None Psych Screenings Due: None  Last CPE: Unknown Immunizations Due: Per PCP  To Do at visit:   - Verify PCP - Urine or Genital GC/CT - UHCG - Assess contraceptive needs and concerns

## 2015-08-09 ENCOUNTER — Emergency Department (HOSPITAL_COMMUNITY)
Admission: EM | Admit: 2015-08-09 | Discharge: 2015-08-10 | Disposition: A | Discharge status: Home / Self Care | Payer: 59 | Attending: Emergency Medicine | Admitting: Emergency Medicine

## 2015-08-09 ENCOUNTER — Encounter (HOSPITAL_COMMUNITY): Payer: Self-pay | Admitting: Emergency Medicine

## 2015-08-09 DIAGNOSIS — R1084 Generalized abdominal pain: Secondary | ICD-10-CM | POA: Diagnosis not present

## 2015-08-09 DIAGNOSIS — R109 Unspecified abdominal pain: Secondary | ICD-10-CM

## 2015-08-09 DIAGNOSIS — R079 Chest pain, unspecified: Secondary | ICD-10-CM | POA: Insufficient documentation

## 2015-08-09 DIAGNOSIS — R011 Cardiac murmur, unspecified: Secondary | ICD-10-CM | POA: Diagnosis not present

## 2015-08-09 DIAGNOSIS — Z8659 Personal history of other mental and behavioral disorders: Secondary | ICD-10-CM | POA: Insufficient documentation

## 2015-08-09 DIAGNOSIS — Z8742 Personal history of other diseases of the female genital tract: Secondary | ICD-10-CM | POA: Insufficient documentation

## 2015-08-09 DIAGNOSIS — R300 Dysuria: Secondary | ICD-10-CM | POA: Diagnosis not present

## 2015-08-09 DIAGNOSIS — Z3202 Encounter for pregnancy test, result negative: Secondary | ICD-10-CM | POA: Insufficient documentation

## 2015-08-09 DIAGNOSIS — R112 Nausea with vomiting, unspecified: Secondary | ICD-10-CM | POA: Insufficient documentation

## 2015-08-09 DIAGNOSIS — M545 Low back pain: Secondary | ICD-10-CM | POA: Insufficient documentation

## 2015-08-09 DIAGNOSIS — Z62810 Personal history of physical and sexual abuse in childhood: Secondary | ICD-10-CM | POA: Insufficient documentation

## 2015-08-09 LAB — URINALYSIS, ROUTINE W REFLEX MICROSCOPIC
Bilirubin Urine: NEGATIVE
GLUCOSE, UA: NEGATIVE mg/dL
Hgb urine dipstick: NEGATIVE
Ketones, ur: NEGATIVE mg/dL
Nitrite: NEGATIVE
PROTEIN: NEGATIVE mg/dL
Specific Gravity, Urine: 1.023 (ref 1.005–1.030)
Urobilinogen, UA: 1 mg/dL (ref 0.0–1.0)
pH: 6.5 (ref 5.0–8.0)

## 2015-08-09 LAB — CBC
HCT: 38.2 % (ref 36.0–46.0)
Hemoglobin: 12.9 g/dL (ref 12.0–15.0)
MCH: 31.3 pg (ref 26.0–34.0)
MCHC: 33.8 g/dL (ref 30.0–36.0)
MCV: 92.7 fL (ref 78.0–100.0)
Platelets: 182 10*3/uL (ref 150–400)
RBC: 4.12 MIL/uL (ref 3.87–5.11)
RDW: 12 % (ref 11.5–15.5)
WBC: 11.6 10*3/uL — ABNORMAL HIGH (ref 4.0–10.5)

## 2015-08-09 LAB — URINE MICROSCOPIC-ADD ON

## 2015-08-09 LAB — COMPREHENSIVE METABOLIC PANEL
ALT: 12 U/L — ABNORMAL LOW (ref 14–54)
ANION GAP: 7 (ref 5–15)
AST: 17 U/L (ref 15–41)
Albumin: 4.1 g/dL (ref 3.5–5.0)
Alkaline Phosphatase: 46 U/L (ref 38–126)
BUN: 11 mg/dL (ref 6–20)
CALCIUM: 9.3 mg/dL (ref 8.9–10.3)
CHLORIDE: 106 mmol/L (ref 101–111)
CO2: 24 mmol/L (ref 22–32)
Creatinine, Ser: 0.65 mg/dL (ref 0.44–1.00)
GFR calc Af Amer: >60 mL/min (ref >60)
GFR calc non Af Amer: >60 mL/min (ref >60)
Glucose, Bld: 93 mg/dL (ref 65–99)
Potassium: 3.6 mmol/L (ref 3.5–5.1)
SODIUM: 137 mmol/L (ref 135–145)
Total Bilirubin: 1.3 mg/dL — ABNORMAL HIGH (ref 0.3–1.2)
Total Protein: 7.4 g/dL (ref 6.5–8.1)

## 2015-08-09 LAB — POC URINE PREG, ED: Preg Test, Ur: NEGATIVE

## 2015-08-09 LAB — LIPASE, BLOOD: Lipase: 27 U/L (ref 11–51)

## 2015-08-09 NOTE — ED Notes (Signed)
Pt. reports generalized abdominal pain/body aches  with nausea and vomitting onset last week , denies diarrhea or fever .

## 2015-08-10 ENCOUNTER — Emergency Department (HOSPITAL_COMMUNITY): Payer: 59

## 2015-08-10 ENCOUNTER — Encounter (HOSPITAL_COMMUNITY): Payer: Self-pay | Admitting: Radiology

## 2015-08-10 LAB — DIFFERENTIAL
BASOS ABS: 0 10*3/uL (ref 0.0–0.1)
BASOS PCT: 0 %
Eosinophils Absolute: 1.5 10*3/uL — ABNORMAL HIGH (ref 0.0–0.7)
Eosinophils Relative: 12 %
LYMPHS ABS: 2.3 10*3/uL (ref 0.7–4.0)
LYMPHS PCT: 19 %
MONOS PCT: 3 %
Monocytes Absolute: 0.4 10*3/uL (ref 0.1–1.0)
NEUTROS ABS: 7.8 10*3/uL — AB (ref 1.7–7.7)
Neutrophils Relative %: 66 %

## 2015-08-10 MED ORDER — SODIUM CHLORIDE 0.9 % IV SOLN
1000.0000 mL | Freq: Once | INTRAVENOUS | Status: AC
  Administered 2015-08-10: 1000 mL via INTRAVENOUS

## 2015-08-10 MED ORDER — KETOROLAC TROMETHAMINE 30 MG/ML IJ SOLN
30.0000 mg | Freq: Once | INTRAMUSCULAR | Status: AC
  Administered 2015-08-10: 30 mg via INTRAVENOUS

## 2015-08-10 MED ORDER — KETOROLAC TROMETHAMINE 30 MG/ML IJ SOLN
INTRAMUSCULAR | Status: AC
  Filled 2015-08-10: qty 1

## 2015-08-10 MED ORDER — ONDANSETRON HCL 4 MG PO TABS: 4.0000 mg | ORAL_TABLET | Freq: Four times a day (QID) | ORAL | Status: DC | PRN

## 2015-08-10 MED ORDER — SODIUM CHLORIDE 0.9 % IV SOLN: 1000.0000 mL | INTRAVENOUS | Status: DC

## 2015-08-10 MED ORDER — ONDANSETRON HCL 4 MG/2ML IJ SOLN
4.0000 mg | Freq: Once | INTRAMUSCULAR | Status: AC
  Administered 2015-08-10: 4 mg via INTRAVENOUS
  Filled 2015-08-10: qty 2

## 2015-08-10 MED ORDER — IOHEXOL 300 MG/ML  SOLN
80.0000 mL | Freq: Once | INTRAMUSCULAR | Status: AC | PRN
  Administered 2015-08-10: 100 mL via INTRAVENOUS

## 2015-08-10 NOTE — ED Notes (Signed)
Patient transported to CT 
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
## 2015-08-10 NOTE — Discharge Instructions (Signed)
Nausea and Vomiting °Nausea is a sick feeling that often comes before throwing up (vomiting). Vomiting is a reflex where stomach contents come out of your mouth. Vomiting can cause severe loss of body fluids (dehydration). Children and elderly adults can become dehydrated quickly, especially if they also have diarrhea. Nausea and vomiting are symptoms of a condition or disease. It is important to find the cause of your symptoms. °CAUSES  °· Direct irritation of the stomach lining. This irritation can result from increased acid production (gastroesophageal reflux disease), infection, food poisoning, taking certain medicines (such as nonsteroidal anti-inflammatory drugs), alcohol use, or tobacco use. °· Signals from the brain. These signals could be caused by a headache, heat exposure, an inner ear disturbance, increased pressure in the brain from injury, infection, a tumor, or a concussion, pain, emotional stimulus, or metabolic problems. °· An obstruction in the gastrointestinal tract (bowel obstruction). °· Illnesses such as diabetes, hepatitis, gallbladder problems, appendicitis, kidney problems, cancer, sepsis, atypical symptoms of a heart attack, or eating disorders. °· Medical treatments such as chemotherapy and radiation. °· Receiving medicine that makes you sleep (general anesthetic) during surgery. °DIAGNOSIS °Your caregiver may ask for tests to be done if the problems do not improve after a few days. Tests may also be done if symptoms are severe or if the reason for the nausea and vomiting is not clear. Tests may include: °· Urine tests. °· Blood tests. °· Stool tests. °· Cultures (to look for evidence of infection). °· X-rays or other imaging studies. °Test results can help your caregiver make decisions about treatment or the need for additional tests. °TREATMENT °You need to stay well hydrated. Drink frequently but in small amounts. You may wish to drink water, sports drinks, clear broth, or eat frozen  ice pops or gelatin dessert to help stay hydrated. When you eat, eating slowly may help prevent nausea. There are also some antinausea medicines that may help prevent nausea. °HOME CARE INSTRUCTIONS  °· Take all medicine as directed by your caregiver. °· If you do not have an appetite, do not force yourself to eat. However, you must continue to drink fluids. °· If you have an appetite, eat a normal diet unless your caregiver tells you differently. °· Eat a variety of complex carbohydrates (rice, wheat, potatoes, bread), lean meats, yogurt, fruits, and vegetables. °· Avoid high-fat foods because they are more difficult to digest. °· Drink enough water and fluids to keep your urine clear or pale yellow. °· If you are dehydrated, ask your caregiver for specific rehydration instructions. Signs of dehydration may include: °· Severe thirst. °· Dry lips and mouth. °· Dizziness. °· Dark urine. °· Decreasing urine frequency and amount. °· Confusion. °· Rapid breathing or pulse. °SEEK IMMEDIATE MEDICAL CARE IF:  °· You have blood or brown flecks (like coffee grounds) in your vomit. °· You have black or bloody stools. °· You have a severe headache or stiff neck. °· You are confused. °· You have severe abdominal pain. °· You have chest pain or trouble breathing. °· You do not urinate at least once every 8 hours. °· You develop cold or clammy skin. °· You continue to vomit for longer than 24 to 48 hours. °· You have a fever. °MAKE SURE YOU:  °· Understand these instructions. °· Will watch your condition. °· Will get help right away if you are not doing well or get worse. °  °This information is not intended to replace advice given to you by your health care provider. Make sure   you discuss any questions you have with your health care provider.   Document Released: 09/11/2005 Document Revised: 12/04/2011 Document Reviewed: 02/08/2011 Elsevier Interactive Patient Education 2016 Elsevier Inc.  Abdominal Pain, Adult Many  things can cause abdominal pain. Usually, abdominal pain is not caused by a disease and will improve without treatment. It can often be observed and treated at home. Your health care provider will do a physical exam and possibly order blood tests and X-rays to help determine the seriousness of your pain. However, in many cases, more time must pass before a clear cause of the pain can be found. Before that point, your health care provider may not know if you need more testing or further treatment. HOME CARE INSTRUCTIONS Monitor your abdominal pain for any changes. The following actions may help to alleviate any discomfort you are experiencing:  Only take over-the-counter or prescription medicines as directed by your health care provider.  Do not take laxatives unless directed to do so by your health care provider.  Try a clear liquid diet (broth, tea, or water) as directed by your health care provider. Slowly move to a bland diet as tolerated. SEEK MEDICAL CARE IF:  You have unexplained abdominal pain.  You have abdominal pain associated with nausea or diarrhea.  You have pain when you urinate or have a bowel movement.  You experience abdominal pain that wakes you in the night.  You have abdominal pain that is worsened or improved by eating food.  You have abdominal pain that is worsened with eating fatty foods.  You have a fever. SEEK IMMEDIATE MEDICAL CARE IF:  Your pain does not go away within 2 hours.  You keep throwing up (vomiting).  Your pain is felt only in portions of the abdomen, such as the right side or the left lower portion of the abdomen.  You pass bloody or black tarry stools. MAKE SURE YOU:  Understand these instructions.  Will watch your condition.  Will get help right away if you are not doing well or get worse.   This information is not intended to replace advice given to you by your health care provider. Make sure you discuss any questions you have with  your health care provider.   Document Released: 06/21/2005 Document Revised: 06/02/2015 Document Reviewed: 05/21/2013 Elsevier Interactive Patient Education 2016 Elsevier Inc.  Ondansetron tablets What is this medicine? ONDANSETRON (on DAN se tron) is used to treat nausea and vomiting caused by chemotherapy. It is also used to prevent or treat nausea and vomiting after surgery. This medicine may be used for other purposes; ask your health care provider or pharmacist if you have questions. What should I tell my health care provider before I take this medicine? They need to know if you have any of these conditions: -heart disease -history of irregular heartbeat -liver disease -low levels of magnesium or potassium in the blood -an unusual or allergic reaction to ondansetron, granisetron, other medicines, foods, dyes, or preservatives -pregnant or trying to get pregnant -breast-feeding How should I use this medicine? Take this medicine by mouth with a glass of water. Follow the directions on your prescription label. Take your doses at regular intervals. Do not take your medicine more often than directed. Talk to your pediatrician regarding the use of this medicine in children. Special care may be needed. Overdosage: If you think you have taken too much of this medicine contact a poison control center or emergency room at once. NOTE: This medicine is only  for you. Do not share this medicine with others. What if I miss a dose? If you miss a dose, take it as soon as you can. If it is almost time for your next dose, take only that dose. Do not take double or extra doses. What may interact with this medicine? Do not take this medicine with any of the following medications: -apomorphine -certain medicines for fungal infections like fluconazole, itraconazole, ketoconazole, posaconazole, voriconazole -cisapride -dofetilide -dronedarone -pimozide -thioridazine -ziprasidone This medicine may  also interact with the following medications: -carbamazepine -certain medicines for depression, anxiety, or psychotic disturbances -fentanyl -linezolid -MAOIs like Carbex, Eldepryl, Marplan, Nardil, and Parnate -methylene blue (injected into a vein) -other medicines that prolong the QT interval (cause an abnormal heart rhythm) -phenytoin -rifampicin -tramadol This list may not describe all possible interactions. Give your health care provider a list of all the medicines, herbs, non-prescription drugs, or dietary supplements you use. Also tell them if you smoke, drink alcohol, or use illegal drugs. Some items may interact with your medicine. What should I watch for while using this medicine? Check with your doctor or health care professional right away if you have any sign of an allergic reaction. What side effects may I notice from receiving this medicine? Side effects that you should report to your doctor or health care professional as soon as possible: -allergic reactions like skin rash, itching or hives, swelling of the face, lips or tongue -breathing problems -confusion -dizziness -fast or irregular heartbeat -feeling faint or lightheaded, falls -fever and chills -loss of balance or coordination -seizures -sweating -swelling of the hands or feet -tightness in the chest -tremors -unusually weak or tired Side effects that usually do not require medical attention (report to your doctor or health care professional if they continue or are bothersome): -constipation or diarrhea -headache This list may not describe all possible side effects. Call your doctor for medical advice about side effects. You may report side effects to FDA at 1-800-FDA-1088. Where should I keep my medicine? Keep out of the reach of children. Store between 2 and 30 degrees C (36 and 86 degrees F). Throw away any unused medicine after the expiration date. NOTE: This sheet is a summary. It may not cover all  possible information. If you have questions about this medicine, talk to your doctor, pharmacist, or health care provider.    2016, Elsevier/Gold Standard. (2013-06-18 16:27:45)

## 2015-10-25 ENCOUNTER — Ambulatory Visit (INDEPENDENT_AMBULATORY_CARE_PROVIDER_SITE_OTHER): Payer: 59 | Admitting: Urgent Care

## 2015-10-25 VITALS — BP 110/70 | HR 64 | Temp 98.5°F | Resp 16 | Ht 63.0 in | Wt 125.4 lb

## 2015-10-25 DIAGNOSIS — R52 Pain, unspecified: Secondary | ICD-10-CM | POA: Diagnosis not present

## 2015-10-25 DIAGNOSIS — J069 Acute upper respiratory infection, unspecified: Secondary | ICD-10-CM | POA: Diagnosis not present

## 2015-10-25 DIAGNOSIS — J029 Acute pharyngitis, unspecified: Secondary | ICD-10-CM | POA: Diagnosis not present

## 2015-10-25 LAB — POCT RAPID STREP A (OFFICE): RAPID STREP A SCREEN: NEGATIVE

## 2015-10-25 MED ORDER — HYDROCODONE-HOMATROPINE 5-1.5 MG/5ML PO SYRP: 5.0000 mL | ORAL_SOLUTION | Freq: Every evening | ORAL | Status: DC | PRN

## 2015-10-25 MED ORDER — BENZONATATE 100 MG PO CAPS: 100.0000 mg | ORAL_CAPSULE | Freq: Three times a day (TID) | ORAL | Status: DC | PRN

## 2015-10-25 NOTE — Patient Instructions (Signed)
Upper Respiratory Infection, Adult Most upper respiratory infections (URIs) are a viral infection of the air passages leading to the lungs. A URI affects the nose, throat, and upper air passages. The most common type of URI is nasopharyngitis and is typically referred to as "the common cold." URIs run their course and usually go away on their own. Most of the time, a URI does not require medical attention, but sometimes a bacterial infection in the upper airways can follow a viral infection. This is called a secondary infection. Sinus and middle ear infections are common types of secondary upper respiratory infections. Bacterial pneumonia can also complicate a URI. A URI can worsen asthma and chronic obstructive pulmonary disease (COPD). Sometimes, these complications can require emergency medical care and may be life threatening.  CAUSES Almost all URIs are caused by viruses. A virus is a type of germ and can spread from one person to another.  RISKS FACTORS You may be at risk for a URI if:   You smoke.   You have chronic heart or lung disease.  You have a weakened defense (immune) system.   You are very young or very old.   You have nasal allergies or asthma.  You work in crowded or poorly ventilated areas.  You work in health care facilities or schools. SIGNS AND SYMPTOMS  Symptoms typically develop 2-3 days after you come in contact with a cold virus. Most viral URIs last 7-10 days. However, viral URIs from the influenza virus (flu virus) can last 14-18 days and are typically more severe. Symptoms may include:   Runny or stuffy (congested) nose.   Sneezing.   Cough.   Sore throat.   Headache.   Fatigue.   Fever.   Loss of appetite.   Pain in your forehead, behind your eyes, and over your cheekbones (sinus pain).  Muscle aches.  DIAGNOSIS  Your health care provider may diagnose a URI by:  Physical exam.  Tests to check that your symptoms are not due to  another condition such as:  Strep throat.  Sinusitis.  Pneumonia.  Asthma. TREATMENT  A URI goes away on its own with time. It cannot be cured with medicines, but medicines may be prescribed or recommended to relieve symptoms. Medicines may help:  Reduce your fever.  Reduce your cough.  Relieve nasal congestion. HOME CARE INSTRUCTIONS   Take medicines only as directed by your health care provider.   Gargle warm saltwater or take cough drops to comfort your throat as directed by your health care provider.  Use a warm mist humidifier or inhale steam from a shower to increase air moisture. This may make it easier to breathe.  Drink enough fluid to keep your urine clear or pale yellow.   Eat soups and other clear broths and maintain good nutrition.   Rest as needed.   Return to work when your temperature has returned to normal or as your health care provider advises. You may need to stay home longer to avoid infecting others. You can also use a face mask and careful hand washing to prevent spread of the virus.  Increase the usage of your inhaler if you have asthma.   Do not use any tobacco products, including cigarettes, chewing tobacco, or electronic cigarettes. If you need help quitting, ask your health care provider. PREVENTION  The best way to protect yourself from getting a cold is to practice good hygiene.   Avoid oral or hand contact with people with cold   symptoms.   Wash your hands often if contact occurs.  There is no clear evidence that vitamin C, vitamin E, echinacea, or exercise reduces the chance of developing a cold. However, it is always recommended to get plenty of rest, exercise, and practice good nutrition.  SEEK MEDICAL CARE IF:   You are getting worse rather than better.   Your symptoms are not controlled by medicine.   You have chills.  You have worsening shortness of breath.  You have brown or red mucus.  You have yellow or brown nasal  discharge.  You have pain in your face, especially when you bend forward.  You have a fever.  You have swollen neck glands.  You have pain while swallowing.  You have white areas in the back of your throat. SEEK IMMEDIATE MEDICAL CARE IF:   You have severe or persistent:  Headache.  Ear pain.  Sinus pain.  Chest pain.  You have chronic lung disease and any of the following:  Wheezing.  Prolonged cough.  Coughing up blood.  A change in your usual mucus.  You have a stiff neck.  You have changes in your:  Vision.  Hearing.  Thinking.  Mood. MAKE SURE YOU:   Understand these instructions.  Will watch your condition.  Will get help right away if you are not doing well or get worse.   This information is not intended to replace advice given to you by your health care provider. Make sure you discuss any questions you have with your health care provider.   Document Released: 03/07/2001 Document Revised: 01/26/2015 Document Reviewed: 12/17/2013 Elsevier Interactive Patient Education 2016 [employer]vier Inc.  
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
## 2015-10-27 LAB — CULTURE, GROUP A STREP: ORGANISM ID, BACTERIA: NORMAL

## 2016-07-15 ENCOUNTER — Encounter (HOSPITAL_COMMUNITY): Payer: Self-pay

## 2016-07-15 ENCOUNTER — Emergency Department (HOSPITAL_COMMUNITY)
Admission: EM | Admit: 2016-07-15 | Discharge: 2016-07-16 | Disposition: A | Discharge status: Home / Self Care | Payer: 59 | Attending: Emergency Medicine | Admitting: Emergency Medicine

## 2016-07-15 DIAGNOSIS — N898 Other specified noninflammatory disorders of vagina: Secondary | ICD-10-CM

## 2016-07-15 DIAGNOSIS — R300 Dysuria: Secondary | ICD-10-CM | POA: Insufficient documentation

## 2016-07-15 DIAGNOSIS — Z202 Contact with and (suspected) exposure to infections with a predominantly sexual mode of transmission: Secondary | ICD-10-CM | POA: Insufficient documentation

## 2016-07-15 DIAGNOSIS — Z711 Person with feared health complaint in whom no diagnosis is made: Secondary | ICD-10-CM

## 2016-07-15 DIAGNOSIS — B3731 Acute candidiasis of vulva and vagina: Secondary | ICD-10-CM

## 2016-07-15 DIAGNOSIS — B373 Candidiasis of vulva and vagina: Secondary | ICD-10-CM | POA: Insufficient documentation

## 2016-07-15 LAB — BASIC METABOLIC PANEL
Anion gap: 6 (ref 5–15)
BUN: 11 mg/dL (ref 6–20)
CALCIUM: 9.6 mg/dL (ref 8.9–10.3)
CO2: 27 mmol/L (ref 22–32)
CREATININE: 0.59 mg/dL (ref 0.44–1.00)
Chloride: 103 mmol/L (ref 101–111)
Glucose, Bld: 99 mg/dL (ref 65–99)
Potassium: 4.2 mmol/L (ref 3.5–5.1)
Sodium: 136 mmol/L (ref 135–145)

## 2016-07-15 LAB — CBC
HCT: 39.7 % (ref 36.0–46.0)
HEMOGLOBIN: 13.9 g/dL (ref 12.0–15.0)
MCH: 31.9 pg (ref 26.0–34.0)
MCHC: 35 g/dL (ref 30.0–36.0)
MCV: 91.1 fL (ref 78.0–100.0)
PLATELETS: 192 10*3/uL (ref 150–400)
RBC: 4.36 MIL/uL (ref 3.87–5.11)
RDW: 12.2 % (ref 11.5–15.5)
WBC: 10.2 10*3/uL (ref 4.0–10.5)

## 2016-07-15 LAB — URINALYSIS, ROUTINE W REFLEX MICROSCOPIC
BILIRUBIN URINE: NEGATIVE
Glucose, UA: NEGATIVE mg/dL
Hgb urine dipstick: NEGATIVE
KETONES UR: NEGATIVE mg/dL
LEUKOCYTES UA: NEGATIVE
NITRITE: NEGATIVE
PROTEIN: NEGATIVE mg/dL
Specific Gravity, Urine: 1.021 (ref 1.005–1.030)
pH: 8.5 — ABNORMAL HIGH (ref 5.0–8.0)

## 2016-07-15 LAB — POC URINE PREG, ED: PREG TEST UR: NEGATIVE

## 2016-07-15 NOTE — ED Triage Notes (Signed)
Pt states she started having vaginal itching with some burning and irritation; Pt denies pain; Pt also denies foul odor or discharge; Pt states she has sensation to pee back can not

## 2016-07-16 LAB — WET PREP, GENITAL
Clue Cells Wet Prep HPF POC: NONE SEEN
SPERM: NONE SEEN
Trich, Wet Prep: NONE SEEN

## 2016-07-16 LAB — HIV ANTIBODY (ROUTINE TESTING W REFLEX): HIV SCREEN 4TH GENERATION: NONREACTIVE

## 2016-07-16 LAB — RPR: RPR: NONREACTIVE

## 2016-07-16 MED ORDER — CEFTRIAXONE SODIUM 250 MG IJ SOLR
250.0000 mg | Freq: Once | INTRAMUSCULAR | Status: AC
  Administered 2016-07-16: 250 mg via INTRAMUSCULAR
  Filled 2016-07-16: qty 250

## 2016-07-16 MED ORDER — FLUCONAZOLE 100 MG PO TABS
150.0000 mg | ORAL_TABLET | Freq: Once | ORAL | Status: AC
  Administered 2016-07-16: 150 mg via ORAL
  Filled 2016-07-16: qty 2

## 2016-07-16 MED ORDER — LIDOCAINE HCL (PF) 1 % IJ SOLN
5.0000 mL | Freq: Once | INTRAMUSCULAR | Status: AC
  Administered 2016-07-16: 5 mL
  Filled 2016-07-16: qty 5

## 2016-07-16 MED ORDER — AZITHROMYCIN 250 MG PO TABS
1000.0000 mg | ORAL_TABLET | Freq: Once | ORAL | Status: AC
  Administered 2016-07-16: 1000 mg via ORAL
  Filled 2016-07-16: qty 4
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
## 2016-07-16 NOTE — ED Notes (Signed)
Pt sts she has recently had unprotected intercourse and has been having s/s x2 days

## 2016-07-16 NOTE — ED Notes (Signed)
Pt showing NAD. RR even and unlabored. Voices no questions/concerns at this time. 

## 2016-07-16 NOTE — Discharge Instructions (Signed)
1. Medications: usual home medications °2. Treatment: rest, drink plenty of fluids, use a condom with every sexual encounter °3. Follow Up: Please followup with your primary doctor in 3 days for discussion of your diagnoses and further evaluation after today's visit; if you do not have a primary care doctor use the resource guide provided to find one; Please return to the ER for worsening symptoms, high fevers or persistent vomiting. ° °You have been tested for HIV, syphilis, chlamydia and gonorrhea.  These results will be available in approximately 3 days.  Please inform all sexual partners if you test positive for any of these diseases. ° °

## 2016-07-17 LAB — URINE CULTURE: Culture: NO GROWTH

## 2016-07-17 LAB — HSV 1 ANTIBODY, IGG: HSV 1 GLYCOPROTEIN G AB, IGG: 23.7 {index} — AB (ref 0.00–0.90)

## 2016-07-17 LAB — GC/CHLAMYDIA PROBE AMP (~~LOC~~) NOT AT ARMC
CHLAMYDIA, DNA PROBE: NEGATIVE
NEISSERIA GONORRHEA: NEGATIVE

## 2016-07-17 LAB — HSV 2 ANTIBODY, IGG

## 2016-09-06 ENCOUNTER — Emergency Department (HOSPITAL_COMMUNITY)
Admission: EM | Admit: 2016-09-06 | Discharge: 2016-09-07 | Disposition: A | Discharge status: Home / Self Care | Payer: 59 | Attending: Emergency Medicine | Admitting: Emergency Medicine

## 2016-09-06 ENCOUNTER — Encounter (HOSPITAL_COMMUNITY): Payer: Self-pay | Admitting: Emergency Medicine

## 2016-09-06 DIAGNOSIS — N3090 Cystitis, unspecified without hematuria: Secondary | ICD-10-CM | POA: Insufficient documentation

## 2016-09-06 DIAGNOSIS — Z113 Encounter for screening for infections with a predominantly sexual mode of transmission: Secondary | ICD-10-CM | POA: Insufficient documentation

## 2016-09-06 DIAGNOSIS — N760 Acute vaginitis: Secondary | ICD-10-CM | POA: Insufficient documentation

## 2016-09-06 DIAGNOSIS — B9689 Other specified bacterial agents as the cause of diseases classified elsewhere: Secondary | ICD-10-CM

## 2016-09-06 DIAGNOSIS — Z711 Person with feared health complaint in whom no diagnosis is made: Secondary | ICD-10-CM

## 2016-09-06 LAB — URINALYSIS, ROUTINE W REFLEX MICROSCOPIC
Bilirubin Urine: NEGATIVE
Glucose, UA: NEGATIVE mg/dL
HGB URINE DIPSTICK: NEGATIVE
Ketones, ur: NEGATIVE mg/dL
Nitrite: NEGATIVE
PROTEIN: NEGATIVE mg/dL
Specific Gravity, Urine: 1.006 (ref 1.005–1.030)
pH: 7 (ref 5.0–8.0)

## 2016-09-06 LAB — POC URINE PREG, ED: PREG TEST UR: NEGATIVE

## 2016-09-06 NOTE — ED Triage Notes (Signed)
Pt. reports urinary frequency/dysuria with mild vaginal discharge /irritation onset this week . Denies fever or chills.
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
## 2016-09-07 LAB — WET PREP, GENITAL
Sperm: NONE SEEN
TRICH WET PREP: NONE SEEN
YEAST WET PREP: NONE SEEN

## 2016-09-07 LAB — GC/CHLAMYDIA PROBE AMP (~~LOC~~) NOT AT ARMC
CHLAMYDIA, DNA PROBE: NEGATIVE
NEISSERIA GONORRHEA: NEGATIVE

## 2016-09-07 MED ORDER — CEPHALEXIN 500 MG PO CAPS: 500.0000 mg | ORAL_CAPSULE | Freq: Three times a day (TID) | ORAL | 0 refills | Status: DC

## 2016-09-07 MED ORDER — FLUCONAZOLE 150 MG PO TABS: 150.0000 mg | ORAL_TABLET | Freq: Once | ORAL | 0 refills | Status: AC

## 2016-09-07 MED ORDER — METRONIDAZOLE 500 MG PO TABS: 500.0000 mg | ORAL_TABLET | Freq: Two times a day (BID) | ORAL | 0 refills | Status: DC

## 2016-09-07 MED ORDER — CEFTRIAXONE SODIUM 250 MG IJ SOLR
250.0000 mg | Freq: Once | INTRAMUSCULAR | Status: AC
  Administered 2016-09-07: 250 mg via INTRAMUSCULAR
  Filled 2016-09-07: qty 250

## 2016-09-07 MED ORDER — LIDOCAINE HCL (PF) 1 % IJ SOLN
2.0000 mL | Freq: Once | INTRAMUSCULAR | Status: AC
  Administered 2016-09-07: 2 mL
  Filled 2016-09-07: qty 5

## 2016-09-07 MED ORDER — AZITHROMYCIN 250 MG PO TABS
1000.0000 mg | ORAL_TABLET | Freq: Once | ORAL | Status: AC
  Administered 2016-09-07: 1000 mg via ORAL
  Filled 2016-09-07: qty 4

## 2016-09-08 LAB — URINE CULTURE

## 2017-04-07 ENCOUNTER — Encounter (HOSPITAL_COMMUNITY): Payer: Self-pay

## 2017-04-07 ENCOUNTER — Emergency Department (HOSPITAL_COMMUNITY)
Admission: EM | Admit: 2017-04-07 | Discharge: 2017-04-07 | Disposition: A | Discharge status: Home / Self Care | Payer: 59 | Attending: Emergency Medicine | Admitting: Emergency Medicine

## 2017-04-07 DIAGNOSIS — X102XXA Contact with fats and cooking oils, initial encounter: Secondary | ICD-10-CM | POA: Insufficient documentation

## 2017-04-07 DIAGNOSIS — Y93G3 Activity, cooking and baking: Secondary | ICD-10-CM | POA: Insufficient documentation

## 2017-04-07 DIAGNOSIS — T2020XA Burn of second degree of head, face, and neck, unspecified site, initial encounter: Secondary | ICD-10-CM | POA: Insufficient documentation

## 2017-04-07 DIAGNOSIS — Y998 Other external cause status: Secondary | ICD-10-CM | POA: Insufficient documentation

## 2017-04-07 DIAGNOSIS — T22211A Burn of second degree of right forearm, initial encounter: Secondary | ICD-10-CM | POA: Insufficient documentation

## 2017-04-07 DIAGNOSIS — T2000XA Burn of unspecified degree of head, face, and neck, unspecified site, initial encounter: Secondary | ICD-10-CM | POA: Insufficient documentation

## 2017-04-07 DIAGNOSIS — Y929 Unspecified place or not applicable: Secondary | ICD-10-CM | POA: Insufficient documentation

## 2017-04-07 MED ORDER — HYDROCODONE-ACETAMINOPHEN 5-325 MG PO TABS: 1.0000 | ORAL_TABLET | Freq: Four times a day (QID) | ORAL | 0 refills | Status: DC | PRN

## 2017-04-07 MED ORDER — HYDROCODONE-ACETAMINOPHEN 5-325 MG PO TABS
2.0000 | ORAL_TABLET | Freq: Once | ORAL | Status: AC
  Administered 2017-04-07: 2 via ORAL
  Filled 2017-04-07: qty 2

## 2017-04-07 MED ORDER — BACITRACIN ZINC 500 UNIT/GM EX OINT
TOPICAL_OINTMENT | Freq: Two times a day (BID) | CUTANEOUS | Status: DC
  Administered 2017-04-07: 21:00:00 via TOPICAL
  Filled 2017-04-07: qty 0.9

## 2017-04-07 MED ORDER — TETANUS-DIPHTH-ACELL PERTUSSIS 5-2.5-18.5 LF-MCG/0.5 IM SUSP
0.5000 mL | Freq: Once | INTRAMUSCULAR | Status: AC
  Administered 2017-04-07: 0.5 mL via INTRAMUSCULAR
  Filled 2017-04-07: qty 0.5

## 2017-04-07 MED ORDER — IBUPROFEN 400 MG PO TABS
400.0000 mg | ORAL_TABLET | Freq: Once | ORAL | Status: AC
  Administered 2017-04-07: 400 mg via ORAL
  Filled 2017-04-07: qty 1

## 2017-04-07 MED ORDER — SILVER SULFADIAZINE 1 % EX CREA
TOPICAL_CREAM | Freq: Once | CUTANEOUS | Status: AC
  Administered 2017-04-07: 21:00:00 via TOPICAL
  Filled 2017-04-07: qty 85
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
## 2017-04-07 NOTE — Discharge Instructions (Signed)
It was our pleasure to provide your ER care today - we hope that you feel better.  For face, apply a thin coat of bacitracin ointment to area 1-2x/day for the next few days.   For forearm, change silvadene burn dressing daily.  Completely remove the old dressing and cream, wash area gently but thoroughly with water and soap, and then apply new silvadene cream/dressing.    Follow up for wound check in 2 days time.  See referral - call office Monday to arrange appointment.   Keep burn areas out of sun, in order to avoid permanent discoloration of skin.  Take motrin or aleve as need for pain. You may also take hydrocodone as need for pain. No driving for the next 6 hours or when taking hydrocodone. Also, do not take tylenol or acetaminophen containing medication when taking hydrocodone.
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
## 2017-04-09 DIAGNOSIS — T2020XA Burn of second degree of head, face, and neck, unspecified site, initial encounter: Secondary | ICD-10-CM | POA: Insufficient documentation

## 2017-04-09 DIAGNOSIS — T2220XA Burn of second degree of shoulder and upper limb, except wrist and hand, unspecified site, initial encounter: Secondary | ICD-10-CM

## 2017-04-09 HISTORY — DX: Burn of second degree of head, face, and neck, unspecified site, initial encounter: T20.20XA

## 2017-04-09 HISTORY — DX: Burn of second degree of shoulder and upper limb, except wrist and hand, unspecified site, initial encounter: T22.20XA

## 2019-05-20 ENCOUNTER — Encounter (HOSPITAL_COMMUNITY): Payer: Self-pay

## 2019-05-20 ENCOUNTER — Other Ambulatory Visit: Payer: Self-pay

## 2019-05-20 ENCOUNTER — Ambulatory Visit (HOSPITAL_COMMUNITY)
Admission: EM | Admit: 2019-05-20 | Discharge: 2019-05-20 | Disposition: A | Discharge status: Home / Self Care | Payer: BC Managed Care – PPO | Attending: Family Medicine | Admitting: Family Medicine

## 2019-05-20 DIAGNOSIS — U071 COVID-19: Secondary | ICD-10-CM | POA: Diagnosis not present

## 2019-05-20 DIAGNOSIS — J029 Acute pharyngitis, unspecified: Secondary | ICD-10-CM | POA: Diagnosis present

## 2019-05-20 DIAGNOSIS — R6889 Other general symptoms and signs: Secondary | ICD-10-CM | POA: Diagnosis present

## 2019-05-20 DIAGNOSIS — Z20822 Contact with and (suspected) exposure to covid-19: Secondary | ICD-10-CM

## 2019-05-20 DIAGNOSIS — R059 Cough, unspecified: Secondary | ICD-10-CM

## 2019-05-20 DIAGNOSIS — R05 Cough: Secondary | ICD-10-CM | POA: Insufficient documentation

## 2019-05-20 DIAGNOSIS — R438 Other disturbances of smell and taste: Secondary | ICD-10-CM

## 2019-05-20 DIAGNOSIS — Z20828 Contact with and (suspected) exposure to other viral communicable diseases: Secondary | ICD-10-CM | POA: Diagnosis not present

## 2019-05-20 LAB — POCT RAPID STREP A: Streptococcus, Group A Screen (Direct): NEGATIVE

## 2019-05-20 NOTE — Discharge Instructions (Addendum)
Your rapid strep test was negative; a culture is pending and we will call you if it is positive requiring treatment.  Your COVID test is pending.  You should self quarantine until your test result is back and is negative.    Go to the emergency department if you develop difficulty swallowing, shortness of breath, high fever, severe diarrhea, or other concerning symptoms.

## 2019-05-20 NOTE — ED Triage Notes (Signed)
Pt presents to UC with c/o "something in back of throat" that she cannot cough up since few days. Denies congestion. States cough starts when takes a deep breath.
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
## 2019-05-22 LAB — CULTURE, GROUP A STREP (THRC)

## 2019-05-23 ENCOUNTER — Telehealth (HOSPITAL_COMMUNITY): Payer: Self-pay | Admitting: Emergency Medicine

## 2019-05-23 LAB — NOVEL CORONAVIRUS, NAA (HOSP ORDER, SEND-OUT TO REF LAB; TAT 18-24 HRS): SARS-CoV-2, NAA: DETECTED — AB

## 2019-05-23 NOTE — Telephone Encounter (Signed)
Your test for COVID-19 was positive, meaning that you were infected with the novel coronavirus and could give the germ to others.  Please continue isolation at home, for at least 10 days since the start of your fever/cough/breathlessness and until you have had 3 consecutive days without fever (without taking a fever reducer) and with cough/breathlessness improving. Please continue good preventive care measures, including:  frequent hand-washing, avoid touching your face, cover coughs/sneezes, stay out of crowds and keep a 6 foot distance from others.  Recheck or go to the nearest hospital ED tent for re-assessment if fever/cough/breathlessness return.  Patient contacted and made aware of    results, all questions answered   

## 2019-06-06 ENCOUNTER — Other Ambulatory Visit: Payer: Self-pay

## 2019-06-06 DIAGNOSIS — Z20822 Contact with and (suspected) exposure to covid-19: Secondary | ICD-10-CM

## 2019-06-08 LAB — NOVEL CORONAVIRUS, NAA: SARS-CoV-2, NAA: NOT DETECTED

## 2020-01-28 ENCOUNTER — Ambulatory Visit: Payer: 59 | Attending: Internal Medicine

## 2020-01-28 DIAGNOSIS — Z20822 Contact with and (suspected) exposure to covid-19: Secondary | ICD-10-CM

## 2020-01-29 ENCOUNTER — Telehealth: Payer: Self-pay | Admitting: *Deleted

## 2020-01-29 LAB — NOVEL CORONAVIRUS, NAA: SARS-CoV-2, NAA: NOT DETECTED

## 2020-01-29 LAB — SARS-COV-2, NAA 2 DAY TAT

## 2020-01-29 NOTE — Telephone Encounter (Signed)
Slight headache   Advised pt to follow 14 day asymptomatic with exposure to known Covid positive person quarantine per CDC recommendations. Advised pt that Day 1 of quarantine is 01/28/20. Plan for testing on Day 8, date 02/04/20. Pt prefers to self schedule at [facility] as she also needs to schedule testing for her children at the same time. Advised pt clinic staff will f/u next week with anticipated results. If no sx develop and no fever in previous >24hrs without antipyretics, pt to complete quarantine 02/10/20 and return to work 02/11/20.    Reviewed possible Covid sx including cough, ShOB, sinusitis sx, sore throat, fever/chills, body aches, fatigue, loss of taste/smell, GI sx n/v/d. Also reviewed same day/emergent eval/ER precautions of dizziness/syncope, confusion, blue tint to lips/face, severe ShOB/difficulty breathing.    Pt verbalizes understanding and agreement with plan of care. No further questions/concerns at this time. Pt reminded to contact clinic with any changes in sx or questions/concerns.

## 2020-01-29 NOTE — Telephone Encounter (Signed)
Noted agreed with plan of care per CDC guidelines; late entry telephone message left for patient yesterday to contact clinic today as notified by HR Tim yesterday of known family member covid exposure.  Detailed message with cleaning/sanitizing home e.g. door/cabinet handles/fridge/microwave/faucet handles.  Covid positive family member to be in separate room and own bathroom if possible and wearing mask/frequent handwashing if in shared rooms.  Use dishwasher if possible as high temperature sanitizes utensils/dishes.  Do not share unwashed dishes/utensils.  Discussed RN Rolly Salter and I in clinic Thursday to help with scheduling testing and answer further questions during normal clinic hours 0830-1430.  I can be reached via email [email or web address] if questions when clinic closed.  No email or voicemail received in clinic and I asked RN Rolly Salter to contact patient today to follow up.

## 2020-02-04 ENCOUNTER — Ambulatory Visit: Payer: 59 | Attending: Internal Medicine

## 2020-02-04 DIAGNOSIS — Z20822 Contact with and (suspected) exposure to covid-19: Secondary | ICD-10-CM

## 2020-02-05 LAB — SARS-COV-2, NAA 2 DAY TAT

## 2020-02-05 LAB — NOVEL CORONAVIRUS, NAA: SARS-CoV-2, NAA: NOT DETECTED

## 2020-02-06 NOTE — Telephone Encounter (Signed)
Pt returned phone call. She has seen negative covid test result in MyChart. Sts no one else in household has tested positive. No symptoms for herself or household members.  Plan to RTW 5/19 after completing quarantine 5/18. Advised pt to contact clinic with any changes in status or questions/concerns. She verbalizes understanding and agreement with plan of care. No further questions/concerns at this time.

## 2020-02-06 NOTE — Telephone Encounter (Signed)
Attempted to contact pt by phone. Unidentified female answered and stated Alexis Gordon was not available. RN name and phone number given requesting return call. He reports he will give to her.

## 2020-02-13 NOTE — Telephone Encounter (Signed)
Please verify patient returned to work as planned 

## 2020-02-16 ENCOUNTER — Encounter: Payer: Self-pay | Admitting: *Deleted

## 2020-02-16 NOTE — Telephone Encounter (Signed)
Noted RTW as planned 02/11/2020

## 2020-02-16 NOTE — Telephone Encounter (Signed)
Pt RTW as planned on 5/19.

## 2020-04-13 ENCOUNTER — Ambulatory Visit: Payer: Self-pay | Admitting: Registered Nurse

## 2020-04-13 ENCOUNTER — Encounter: Payer: Self-pay | Admitting: Registered Nurse

## 2020-04-13 ENCOUNTER — Other Ambulatory Visit: Payer: Self-pay

## 2020-04-13 VITALS — BP 110/68 | HR 67 | Temp 98.3°F

## 2020-04-13 DIAGNOSIS — S60511A Abrasion of right hand, initial encounter: Secondary | ICD-10-CM

## 2020-04-13 DIAGNOSIS — S60221A Contusion of right hand, initial encounter: Secondary | ICD-10-CM

## 2020-04-13 DIAGNOSIS — H00011 Hordeolum externum right upper eyelid: Secondary | ICD-10-CM

## 2020-04-13 MED ORDER — IBUPROFEN 800 MG PO TABS: 800.0000 mg | ORAL_TABLET | Freq: Three times a day (TID) | ORAL | 0 refills | Status: DC

## 2020-04-13 MED ORDER — REFRESH PLUS 0.5 % OP SOLN: 1.0000 [drp] | Freq: Three times a day (TID) | OPHTHALMIC | 0 refills | Status: AC | PRN

## 2020-04-13 MED ORDER — ACETAMINOPHEN 500 MG PO TABS: 1000.0000 mg | ORAL_TABLET | Freq: Four times a day (QID) | ORAL | 0 refills | Status: AC | PRN

## 2020-04-13 MED ORDER — ERYTHROMYCIN 5 MG/GM OP OINT: 1.0000 "application " | TOPICAL_OINTMENT | Freq: Four times a day (QID) | OPHTHALMIC | 0 refills | Status: AC

## 2020-04-13 NOTE — Patient Instructions (Signed)
Stye  A stye, also known as a hordeolum, is a bump that forms on an eyelid. It may look like a pimple next to the eyelash. A stye can form inside the eyelid (internal stye) or outside the eyelid (external stye). A stye can cause redness, swelling, and pain on the eyelid. Styes are very common. Anyone can get them at any age. They usually occur in just one eye, but you may have more than one in either eye. What are the causes? A stye is caused by an infection. The infection is almost always caused by bacteria called Staphylococcus aureus. This is a common type of bacteria that lives on the skin. An internal stye may result from an infected oil-producing gland inside the eyelid. An external stye may be caused by an infection at the base of the eyelash (hair follicle). What increases the risk? You are more likely to develop a stye if:  You have had a stye before.  You have any of these conditions: ? Diabetes. ? Red, itchy, inflamed eyelids (blepharitis). ? A skin condition such as seborrheic dermatitis or rosacea. ? High fat levels in your blood (lipids). What are the signs or symptoms? The most common symptom of a stye is eyelid pain. Internal styes are more painful than external styes. Other symptoms may include:  Painful swelling of your eyelid.  A scratchy feeling in your eye.  Tearing and redness of your eye.  Pus draining from the stye. How is this diagnosed? Your health care provider may be able to diagnose a stye just by examining your eye. The health care provider may also check to make sure:  You do not have a fever or other signs of a more serious infection.  The infection has not spread to other parts of your eye or areas around your eye. How is this treated? Most styes will clear up in a few days without treatment or with warm compresses applied to the area. You may need to use antibiotic drops or ointment to treat an infection. In some cases, if your stye does not heal  with routine treatment, your health care provider may drain pus from the stye using a thin blade or needle. This may be done if the stye is large, causing a lot of pain, or affecting your vision. Follow these instructions at home:  Take over-the-counter and prescription medicines only as told by your health care provider. This includes eye drops or ointments.  If you were prescribed an antibiotic medicine, apply or use it as told by your health care provider. Do not stop using the antibiotic even if your condition improves.  Apply a warm, wet cloth (warm compress) to your eye for 5-10 minutes, 4 times a day.  Clean the affected eyelid as directed by your health care provider.  Do not wear contact lenses or eye makeup until your stye has healed.  Do not try to pop or drain the stye.  Do not rub your eye. Contact a health care provider if:  You have chills or a fever.  Your stye does not go away after several days.  Your stye affects your vision.  Your eyeball becomes swollen, red, or painful. Get help right away if:  You have pain when moving your eye around. Summary  A stye is a bump that forms on an eyelid. It may look like a pimple next to the eyelash.  A stye can form inside the eyelid (internal stye) or outside the eyelid (external   stye). A stye can cause redness, swelling, and pain on the eyelid.  Your health care provider may be able to diagnose a stye just by examining your eye.  Apply a warm, wet cloth (warm compress) to your eye for 5-10 minutes, 4 times a day. This information is not intended to replace advice given to you by your health care provider. Make sure you discuss any questions you have with your health care provider. Document Revised: 08/24/2017 Document Reviewed: 05/24/2017 Elsevier Patient Education  2020 [employer] Contusion A contusion is a deep bruise. Contusions are the result of a blunt injury to tissues and muscle fibers under the skin. The  injury causes bleeding under the skin. The skin overlying the contusion may turn blue, purple, or yellow. Minor injuries will give you a painless contusion, but more severe injuries cause contusions that may stay painful and swollen for a few weeks. Follow these instructions at home: Pay attention to any changes in your symptoms. Let your health care provider know about them. Take these actions to relieve your pain. Managing pain, stiffness, and swelling   Use resting, icing, applying pressure (compression), and raising (elevating) the injured area. This is often called the RICE strategy. ? Rest the injured area. Return to your normal activities as told by your health care provider. Ask your health care provider what activities are safe for you. ? If directed, put ice on the injured area:  Put ice in a plastic bag.  Place a towel between your skin and the bag.  Leave the ice on for 20 minutes, 2-3 times per day. ? If directed, apply light compression to the injured area using an elastic bandage. Make sure the bandage is not wrapped too tightly. Remove and reapply the bandage as directed by your health care provider. ? If possible, raise (elevate) the injured area above the level of your heart while you are sitting or lying down. General instructions  Take over-the-counter and prescription medicines only as told by your health care provider.  Keep all follow-up visits as told by your health care provider. This is important. Contact a health care provider if:  Your symptoms do not improve after several days of treatment.  Your symptoms get worse.  You have difficulty moving the injured area. Get help right away if:  You have severe pain.  You have numbness in a hand or foot.  Your hand or foot turns pale or cold. Summary  A contusion is a deep bruise.  Contusions are the result of a blunt injury to tissues and muscle fibers under the skin.  It is treated with rest, ice,  compression, and elevation. You may be given over-the-counter medicines for pain.  Contact a health care provider if your symptoms do not improve, or get worse.  Get help right away if you have severe pain, have numbness, or the area turns pale or cold. This information is not intended to replace advice given to you by your health care provider. Make sure you discuss any questions you have with your health care provider. Document Revised: 05/02/2018 Document Reviewed: 05/02/2018 Elsevier Patient Education  2020 [employer] Inc. Abrasion  An abrasion is a cut or a scrape on the outer surface of the skin. An abrasion does not go through all the layers of the skin. It is important to care for your abrasion properly to prevent infection. What are the causes? This condition is caused by falling on or gliding across the ground or another surface.  When your skin rubs on something, the outer and inner layers of skin may rub off. What are the signs or symptoms? The main symptom of this condition is a cut or a scrape. The scrape may be bleeding, or it may appear red or pink. If the abrasion was caused by a fall, there may be a bruise under the cut or scrape. How is this diagnosed? An abrasion is diagnosed with a physical exam. How is this treated? Treatment for this condition depends on how large and deep the abrasion is. In most cases:  Your abrasion will be cleaned with water and mild soap. This is done to remove any dirt or debris (such as particles of glass or rock) that may be stuck in the wound.  An antibiotic ointment may be applied to the abrasion to help prevent infection.  A bandage (dressing) may be placed on the abrasion to keep it clean. You may also need a tetanus shot. Follow these instructions at home: Medicines  Take or apply over-the-counter and prescription medicines only as told by your health care provider.  If you were prescribed an antibiotic medicine, apply it as told by  your health care provider. Wound care  Clean the wound 2-3 times a day, or as directed by your health care provider. To do this, wash the wound with mild soap and water, rinse off the soap, and pat the wound dry with a clean towel. Do not rub the wound.  Keep the dressing clean and dry as told by your health care provider.  There are many different ways to close and cover a wound. Follow instructions from your health care provider about: ? Caring for your wound. ? Changing and removing your dressing. You may have to change your dressing one or more times a day, or as directed by your health care provider.  Check your wound every day for signs of infection. Check for: ? Redness, particularly a red streak that spreads out from the wound. ? Swelling or increased pain. ? Warmth. ? Fluid, pus, or a bad smell.  If directed, put ice on the injured area to reduce pain and swelling: ? Put ice in a plastic bag. ? Place a towel between your skin and the bag. ? Leave the ice on for 20 minutes, 2-3 times a day. General instructions  Do not take baths, swim, or use a hot tub until your health care provider says it is okay to do so.  If possible, raise (elevate) the injured area above the level of your heart while you are sitting or lying down. This will reduce pain and swelling.  Keep all follow-up visits as directed by your health care provider. This is important. Contact a health care provider if:  You received a tetanus shot, and you have swelling, severe pain, redness, or bleeding at the injection site.  Your pain is not controlled with medicine.  You have redness, swelling, or more pain at the site of your wound. Get help right away if:  You have a red streak spreading away from your wound.  You have a fever.  You have fluid, blood, or pus coming from your wound.  You notice a bad smell coming from your wound or your dressing. Summary  An abrasion is a cut or a scrape on the  outer surface of the skin. An abrasion does not go through all the layers of the skin.  Care for your abrasion properly to prevent infection.  Clean the  wound with mild soap and water 2-3 times a day. Follow instructions from your health care provider about taking medicines and changing your bandage (dressing).  Contact your health care provider if you have redness, swelling or more pain in the wound area.  Get help right away if you have a fever or if you have fluid, blood, pus, a bad smell, or a red streak coming from the wound. This information is not intended to replace advice given to you by your health care provider. Make sure you discuss any questions you have with your health care provider. Document Revised: 08/24/2017 Document Reviewed: 04/25/2017 Elsevier Patient Education  2020 [employer].
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
## 2020-05-10 ENCOUNTER — Ambulatory Visit: Payer: Self-pay | Attending: Internal Medicine

## 2020-05-10 DIAGNOSIS — Z23 Encounter for immunization: Secondary | ICD-10-CM

## 2020-05-10 NOTE — Progress Notes (Signed)
   Covid-19 Vaccination Clinic  Name:  Alexis Gordon    MRN: [id number] DOB: 1995/10/10  05/10/2020  Ms. Alexis Gordon was observed post Covid-19 immunization for 15 minutes without incident. She was provided with Vaccine Information Sheet and instruction to access the V-Safe system.   Ms. Alexis Gordon was instructed to call 911 with any severe reactions post vaccine: Marland Kitchen Difficulty breathing  . Swelling of face and throat  . A fast heartbeat  . A bad rash all over body  . Dizziness and weakness   Immunizations Administered    Name Date Dose VIS Date Route   Pfizer COVID-19 Vaccine 05/10/2020  9:18 AM 0.3 mL 11/19/2018 Intramuscular   Manufacturer: [employer], [employer]   Lot: [id number]   NDC: [address]-9

## 2020-06-07 ENCOUNTER — Ambulatory Visit: Payer: Self-pay

## 2020-06-07 ENCOUNTER — Ambulatory Visit: Payer: BC Managed Care – PPO | Attending: Internal Medicine

## 2020-06-07 DIAGNOSIS — Z23 Encounter for immunization: Secondary | ICD-10-CM

## 2020-06-07 NOTE — Progress Notes (Signed)
   Covid-19 Vaccination Clinic  Name:  Alexis Gordon    MRN: [id number] DOB: 1996/01/03  06/07/2020  Ms. Alexis Gordon was observed post Covid-19 immunization for 15 minutes without incident. She was provided with Vaccine Information Sheet and instruction to access the V-Safe system.   Ms. Alexis Gordon was instructed to call 911 with any severe reactions post vaccine: Marland Kitchen Difficulty breathing  . Swelling of face and throat  . A fast heartbeat  . A bad rash all over body  . Dizziness and weakness   Immunizations Administered    Name Date Dose VIS Date Route   Pfizer COVID-19 Vaccine 06/07/2020  9:39 AM 0.3 mL 11/19/2018 Intramuscular   Manufacturer: [employer], [employer]   Lot: [id number]   NDC: [address]-9

## 2020-06-10 ENCOUNTER — Telehealth: Payer: Self-pay | Admitting: *Deleted

## 2020-06-10 ENCOUNTER — Encounter: Payer: Self-pay | Admitting: *Deleted

## 2020-06-10 NOTE — Telephone Encounter (Signed)
Noted patient not feeling well enough to work post covid vaccination. Symptoms not worsening and patient feels are improving and plans to return to work tomorrow.

## 2020-06-10 NOTE — Telephone Encounter (Signed)
RN notified by HR that pt is on day 3 of post vaccine sx. RN was asked to check on pt. Spoke with pt by phone. She reports starting to feel "bad" within about 12 hours of 2nd Covid vaccine on Monday 9/13. This next morning she awoke with body aches, fever, and chills. She has been treating at home with fluids and Tylenol. She denies any other sx such as congestion, cough, sore throat, etc.  She is feeling better today and plans to RTW tomorrow 9/17.  Husband has similar symptoms and received vaccine at the same time she did. Both were feeling well prior to vaccination. Two children in the household are well and asymptomatic. Believe sx's are r/t an extended vaccine reaction. Do not believe quarantine and testing are indicated at this time. Advised pt if sx worsen or fail to improve by tomorrow, we will re-evaluate. RN out of office tomorrow and pt made aware to contact her supervisor and Tim in HR if she is unable to report to work tomorrow. She verbalizes understanding and agreement and has no further questions/concerns.

## 2020-06-11 NOTE — Telephone Encounter (Signed)
Attempted to reach patient via telephone message stated wireless customer unavailable please try again later and unable to leave voicemail message.  HR Tim left message to verify if patient returned to work as expected today also.

## 2020-06-12 NOTE — Telephone Encounter (Signed)
Confirmed with HR patient returned to work as expected Friday.

## 2020-06-22 ENCOUNTER — Encounter: Payer: Self-pay | Admitting: Registered Nurse

## 2020-06-22 ENCOUNTER — Telehealth: Payer: Self-pay | Admitting: Registered Nurse

## 2020-06-22 DIAGNOSIS — J00 Acute nasopharyngitis [common cold]: Secondary | ICD-10-CM

## 2020-06-22 NOTE — Telephone Encounter (Signed)
Notified by HR Tim patient reporting family member test results negative.  Asking if patient can return to work onsite tomorrow.  Discussed with HR patient cleared to return to work onsite as long as no new symptoms or fever/vomiting/diarrhea overnight.

## 2020-06-22 NOTE — Telephone Encounter (Signed)
Patient at family reunion this weekend.  Was not wearing mask with parents and sister in law this weekend.  Patient found out family member (sister in law) went to get covid test yesterday due to symptoms.  She had headache between eyes and loose stools today and sent home from work on Monday as not feeling well started on nasal saline, flonase by [employer].  Headache resolved earlier today after taking medicine.  Denied fever/chills/vomiting/diarrhea/sore throat/loss of taste/smell.  She feels good and wants to return to work tomorrow.  Discussed with patient fully vaccinated covid symptoms are milder than unvaccinated patients and typically do not require hospitalization.  There has been increase in allergy/sinus only like symptoms and positive covid tests in community without fever/chills/body aches/loss of taste/smell.    Due to sister in law tested for covid and she was with her all weekend in close contact and patient not feeling well on Monday as covid pandemic precaution to continue quarantine at home until sister in law test results available and monitor her symptoms.  If new or worsening symptoms to notify clinic staff.  I will be calling her tomorrow or she can email me tomorrow once she is notified of sister in law test results and for me to check in on her symptoms tomorrow.  Discussed bland diet, ginger ale, powerade/gatorade, pasta, toast with patient for diarrhea and hydrating with soup/clear liquids noncaffeinated to avoid dehydration from diarrhea.  Discussed with patient covid and noncovid viral gastroenteritis has been circulating in community also.  Day 1 symptoms 06/21/2020.  Quarantine started same day  Patient saw RN Rolly Salter in clinic 9/27.  Patient asked if children can go to school.  Discussed to follow their school rules regarding covid exposure/symptoms.  Typically schools are not allowing children who are sick to return to school e.g. fever/vomiting/diarrhea.  Some schools not allowing  cough/runny nose without negative covid test and to follow her children's school policy.  Discussed with patient if family member test positive will schedule her for testing days 3-5 of her symptoms tomorrow - Friday window and continue quarantine.  If family member negative covid test and her symptoms improving she may return to work 9/30 with HR approval.  Patient verbalized understanding information/instructions, agreed with plan of care and had no further questions at this time.  HR Tim notified patient to remain on quarantine as awaiting family member covid test results.

## 2020-06-22 NOTE — Telephone Encounter (Signed)
Notified by HR Tim patient called out sick due to headache.  HR requested clinic staff call patient.  Patient had second covid vaccination 07 Jun 2020 and was out of work a couple days due to vaccine side effects but had returned to work prior to this episode of headache  Telephone message left for patient I was contacting her per HR request to follow up possible symptoms of covid infection.  Voicemail left stating I would try to reach her again at 2016 so she would have time to listen to my voicemail as calling from home and number private/blocked.  I can be reached at [email or web address] also.  Clinic closed tomorrow (normal closed day)  RN Rolly Salter and I return on Thursday 9/30 [id number]

## 2020-06-24 NOTE — Telephone Encounter (Signed)
noted 

## 2020-06-24 NOTE — Telephone Encounter (Signed)
Late entry: RN saw pt in clinic on Monday 9/27 after HR reported that pt was on-site with sinus drainage, raspy voice. Pt reported what felt like "normal allergy" sx that started the day before. Felt that the drainage was causing the raspy voice and she had mild nausea, likely related to the drainage  She was not taking any home meds for this. Started pt on a 2 day allergy trial with Claritin, Flonase, nasal saline spray with plan to f/u on Tuesday 9/28 afternoon with plan of care determination.

## 2020-06-29 NOTE — Telephone Encounter (Signed)
Patient contacted via telephone reported all symptoms resolved and feeling well no further questions or concerns.

## 2020-07-15 ENCOUNTER — Ambulatory Visit: Payer: Self-pay | Admitting: Registered Nurse

## 2020-07-15 ENCOUNTER — Other Ambulatory Visit: Payer: Self-pay

## 2020-07-15 VITALS — BP 122/88 | HR 78 | Temp 98.2°F | Resp 16

## 2020-07-15 DIAGNOSIS — N3001 Acute cystitis with hematuria: Secondary | ICD-10-CM

## 2020-07-15 LAB — POCT URINALYSIS DIPSTICK
Bilirubin, UA: NEGATIVE
Glucose, UA: NEGATIVE mg/dL
Ketones, POC UA: NEGATIVE mg/dL
Nitrite, UA: NEGATIVE
Specific Gravity, UA: 1.03 (ref 1.005–1.030)
Urobilinogen, UA: 0.2 E.U./dL
pH, UA: 6 (ref 5.0–8.0)

## 2020-07-15 MED ORDER — PHENAZOPYRIDINE HCL 200 MG PO TABS: 200.0000 mg | ORAL_TABLET | Freq: Three times a day (TID) | ORAL | 0 refills | Status: DC

## 2020-07-15 MED ORDER — SULFAMETHOXAZOLE-TRIMETHOPRIM 800-160 MG PO TABS: 1.0000 | ORAL_TABLET | Freq: Two times a day (BID) | ORAL | 0 refills | Status: AC

## 2020-07-15 NOTE — Patient Instructions (Signed)
No sex x 3 days Push water intake to urinate every 3-4 hours Urinary Tract Infection, Adult  A urinary tract infection (UTI) is an infection of any part of the urinary tract. The urinary tract includes the kidneys, ureters, bladder, and urethra. These organs make, store, and get rid of urine in the body. Your health care provider may use other names to describe the infection. An upper UTI affects the ureters and kidneys (pyelonephritis). A lower UTI affects the bladder (cystitis) and urethra (urethritis). What are the causes? Most urinary tract infections are caused by bacteria in your genital area, around the entrance to your urinary tract (urethra). These bacteria grow and cause inflammation of your urinary tract. What increases the risk? You are more likely to develop this condition if:  You have a urinary catheter that stays in place (indwelling).  You are not able to control when you urinate or have a bowel movement (you have incontinence).  You are female and you: ? Use a spermicide or diaphragm for birth control. ? Have low estrogen levels. ? Are pregnant.  You have certain genes that increase your risk (genetics).  You are sexually active.  You take antibiotic medicines.  You have a condition that causes your flow of urine to slow down, such as: ? An enlarged prostate, if you are female. ? Blockage in your urethra (stricture). ? A kidney stone. ? A nerve condition that affects your bladder control (neurogenic bladder). ? Not getting enough to drink, or not urinating often.  You have certain medical conditions, such as: ? Diabetes. ? A weak disease-fighting system (immunesystem). ? Sickle cell disease. ? Gout. ? Spinal cord injury. What are the signs or symptoms? Symptoms of this condition include:  Needing to urinate right away (urgently).  Frequent urination or passing small amounts of urine frequently.  Pain or burning with urination.  Blood in the  urine.  Urine that smells bad or unusual.  Trouble urinating.  Cloudy urine.  Vaginal discharge, if you are female.  Pain in the abdomen or the lower back. You may also have:  Vomiting or a decreased appetite.  Confusion.  Irritability or tiredness.  A fever.  Diarrhea. The first symptom in older adults may be confusion. In some cases, they may not have any symptoms until the infection has worsened. How is this diagnosed? This condition is diagnosed based on your medical history and a physical exam. You may also have other tests, including:  Urine tests.  Blood tests.  Tests for sexually transmitted infections (STIs). If you have had more than one UTI, a cystoscopy or imaging studies may be done to determine the cause of the infections. How is this treated? Treatment for this condition includes:  Antibiotic medicine.  Over-the-counter medicines to treat discomfort.  Drinking enough water to stay hydrated. If you have frequent infections or have other conditions such as a kidney stone, you may need to see a health care provider who specializes in the urinary tract (urologist). In rare cases, urinary tract infections can cause sepsis. Sepsis is a life-threatening condition that occurs when the body responds to an infection. Sepsis is treated in the hospital with IV antibiotics, fluids, and other medicines. Follow these instructions at home:  Medicines  Take over-the-counter and prescription medicines only as told by your health care provider.  If you were prescribed an antibiotic medicine, take it as told by your health care provider. Do not stop using the antibiotic even if you start to feel  better. General instructions  Make sure you: ? Empty your bladder often and completely. Do not hold urine for long periods of time. ? Empty your bladder after sex. ? Wipe from front to back after a bowel movement if you are female. Use each tissue one time when you  wipe.  Drink enough fluid to keep your urine pale yellow.  Keep all follow-up visits as told by your health care provider. This is important. Contact a health care provider if:  Your symptoms do not get better after 1-2 days.  Your symptoms go away and then return. Get help right away if you have:  Severe pain in your back or your lower abdomen.  A fever.  Nausea or vomiting. Summary  A urinary tract infection (UTI) is an infection of any part of the urinary tract, which includes the kidneys, ureters, bladder, and urethra.  Most urinary tract infections are caused by bacteria in your genital area, around the entrance to your urinary tract (urethra).  Treatment for this condition often includes antibiotic medicines.  If you were prescribed an antibiotic medicine, take it as told by your health care provider. Do not stop using the antibiotic even if you start to feel better.  Keep all follow-up visits as told by your health care provider. This is important. This information is not intended to replace advice given to you by your health care provider. Make sure you discuss any questions you have with your health care provider. Document Revised: 08/29/2018 Document Reviewed: 03/21/2018 Elsevier Patient Education  2020 [employer].

## 2020-07-15 NOTE — Progress Notes (Signed)
Subjective:    Patient ID: Alexis Gordon, female    DOB: Jun 18, 1996, 24 y.o.   MRN: [id number]  24y/o Hispanic established female pt c/o uti sx that started 2-3 days ago. Central low back pain and urinary urgency. Last UTI over one year ago, not common for pt. Per epic review last urinalysis urine culture 2017 (multiples species or no growth).  Patient reported started menses yesterday after IUD removed at [facility] office.  Denied hand/feet swelling, headache, abdomen pain, n/v/d.  Patient did not have sex yesterday after IUD removal.  She stated her IUD was supposed to be removed a year ago but she had delayed due to covid.  She reported the first couple of years after IUD placed no menses but has had spotting in the last year.     Review of Systems  Constitutional: Positive for activity change. Negative for appetite change, chills, diaphoresis, fatigue and fever.  HENT: Negative for trouble swallowing and voice change.   Eyes: Negative for photophobia and visual disturbance.  Respiratory: Negative for cough, shortness of breath, wheezing and stridor.   Cardiovascular: Negative for leg swelling.  Gastrointestinal: Negative for diarrhea, nausea and vomiting.  Endocrine: Negative for cold intolerance and heat intolerance.  Genitourinary: Positive for dysuria and vaginal bleeding. Negative for decreased urine volume, flank pain, genital sores, hematuria, menstrual problem, pelvic pain, urgency, vaginal discharge and vaginal pain.  Musculoskeletal: Positive for back pain. Negative for arthralgias, gait problem, joint swelling, myalgias, neck pain and neck stiffness.  Skin: Negative for color change and rash.  Allergic/Immunologic: Negative for environmental allergies and food allergies.  Neurological: Negative for dizziness, tremors, seizures, syncope, facial asymmetry, speech difficulty, weakness, light-headedness, numbness and headaches.  Hematological: Negative for adenopathy. Does not  bruise/bleed easily.  Psychiatric/Behavioral: Negative for agitation, confusion and sleep disturbance.       Objective:   Physical Exam Constitutional:      General: She is awake. She is not in acute distress.    Appearance: Normal appearance. She is well-developed, well-groomed and normal weight. She is not ill-appearing, toxic-appearing or diaphoretic.  HENT:     Head: Normocephalic and atraumatic.     Jaw: There is normal jaw occlusion.     Salivary Glands: Right salivary gland is not diffusely enlarged. Left salivary gland is not diffusely enlarged.     Right Ear: Hearing and external ear normal.     Left Ear: Hearing and external ear normal.     Nose: Nose normal.     Mouth/Throat:     Mouth: No angioedema.     Pharynx: Oropharynx is clear.  Eyes:     General: Lids are normal. Vision grossly intact. Gaze aligned appropriately. No visual field deficit or scleral icterus.       Right eye: No discharge.        Left eye: No discharge.     Extraocular Movements: Extraocular movements intact.     Conjunctiva/sclera: Conjunctivae normal.     Pupils: Pupils are equal, round, and reactive to light.  Cardiovascular:     Rate and Rhythm: Normal rate and regular rhythm.     Pulses: Normal pulses.          Radial pulses are 2+ on the right side and 2+ on the left side.  Pulmonary:     Effort: Pulmonary effort is normal. No respiratory distress.     Breath sounds: Normal breath sounds and air entry. No stridor or transmitted upper airway sounds. No  wheezing.     Comments: Spoke full sentences without difficulty; wearing cloth mask due to covid 19 pandemic; no cough observed in clinic Abdominal:     General: Abdomen is flat. Bowel sounds are decreased. There is no distension.     Palpations: Abdomen is soft. There is no shifting dullness, fluid wave, hepatomegaly, splenomegaly, mass or pulsatile mass.     Tenderness: There is no abdominal tenderness. There is no right CVA tenderness, left  CVA tenderness, guarding or rebound. Negative signs include Murphy's sign.     Hernia: No hernia is present. There is no hernia in the umbilical area or ventral area.  Musculoskeletal:        General: No swelling, tenderness, deformity or signs of injury. Normal range of motion.     Cervical back: Normal range of motion and neck supple. No swelling, edema, deformity, erythema, signs of trauma, lacerations or rigidity.     Thoracic back: No swelling, edema, deformity, signs of trauma or lacerations.     Lumbar back: No swelling, edema, deformity, signs of trauma, lacerations, spasms or tenderness. Normal range of motion.       Back:     Right lower leg: No edema.     Left lower leg: No edema.     Comments: Not TTP lumbar/thoracic; standing/sitting to supine and reverse quickly without assist  Lymphadenopathy:     Cervical: No cervical adenopathy.     Right cervical: No superficial cervical adenopathy.    Left cervical: No superficial cervical adenopathy.  Skin:    General: Skin is warm and dry.     Capillary Refill: Capillary refill takes less than 2 seconds.     Coloration: Skin is not ashen, cyanotic, jaundiced, mottled, pale or sallow.     Findings: No abrasion, abscess, acne, bruising, burn, ecchymosis, erythema, signs of injury, laceration, lesion, petechiae, rash or wound.     Nails: There is no clubbing.  Neurological:     General: No focal deficit present.     Mental Status: She is alert and oriented to person, place, and time. Mental status is at baseline.     GCS: GCS eye subscore is 4. GCS verbal subscore is 5. GCS motor subscore is 6.     Cranial Nerves: Cranial nerves are intact. No cranial nerve deficit, dysarthria or facial asymmetry.     Sensory: Sensation is intact. No sensory deficit.     Motor: Motor function is intact. No weakness, tremor, atrophy, abnormal muscle tone or seizure activity.     Coordination: Coordination is intact. Coordination normal.     Gait: Gait  is intact. Gait normal.     Comments: On/off exam table and in/out of chair without difficulty; gait sure and steady in hallway; bilateral hand grasp equal 5/5  Psychiatric:        Attention and Perception: Attention and perception normal.        Mood and Affect: Mood and affect normal.        Speech: Speech normal.        Behavior: Behavior normal. Behavior is cooperative.        Thought Content: Thought content normal.        Cognition and Memory: Cognition and memory normal.        Judgment: Judgment normal.      Small blood, protein, leukocytes typically seen if urinary tract infection but blood could be related to menses you are having also. Drink water to pee every 3-4 hours  while awake. Do not hold urine longer than 4 hours when awake. If symptoms not improving after 48 hours on antibiotic please contact me at [email or web address]. Avoid sex until antibiotics completed. Same day re-evaluation with PCM/UC/ER if unable to pee every 8 hours, tea/cola colored urine or worsening back pain with or without vomiting not responding to tylenol 1000mg  by mouth every 6 hours prn pain.      Assessment & Plan:  A-UTI with hematuria  P-bactrim DS po BID x 3 days #40 RF0 dispensed from [facility] to patient today.  Start now.  Medications as directed. Patient is also to push fluids and may use Pyridium 200mg  po TID as needed #6 RF0 electronic Rx sent to patient pharmacy of choice.   Discussed urine will be gold/orange in color no matter how much water she drinks while taking pyridium and urine can stain clothes.   Tylenol 1000mg  po q6h prn pain given 4 UD from clinic stock. Hydrate, avoid dehydration. Avoid holding urine void on frequent basis every 2 to 4 hours when awake. If unable to void every 8 hours, tea/cola colored urine or worsening back pain with or without vomiting follow up for re-evaluation with PCM, urgent care or ER same day.  If symptoms not improving with 48 hours (4 doses) antibiotic to  notify clinic staff (closed Sat/Sun/Wed) can email [email or web address].   Call or return to clinic as needed if these symptoms worsen or fail to improve as anticipated. Exitcare handout on UTI printed and given to patient  Patient verbalized agreement and understanding of treatment plan and had no further questions at this time.  P2: Hydrate and cranberry juice

## 2020-07-29 ENCOUNTER — Encounter: Payer: Self-pay | Admitting: Registered Nurse

## 2020-07-29 ENCOUNTER — Ambulatory Visit: Payer: Self-pay | Admitting: Registered Nurse

## 2020-07-29 ENCOUNTER — Other Ambulatory Visit: Payer: Self-pay

## 2020-07-29 VITALS — BP 104/76 | HR 81 | Temp 98.1°F

## 2020-07-29 DIAGNOSIS — L299 Pruritus, unspecified: Secondary | ICD-10-CM

## 2020-07-29 MED ORDER — DIPHENHYDRAMINE HCL 2 % EX GEL: 1.0000 "application " | Freq: Two times a day (BID) | CUTANEOUS | Status: AC | PRN

## 2020-07-29 MED ORDER — PREDNISONE 10 MG PO TABS: ORAL_TABLET | ORAL | 0 refills | Status: AC

## 2020-07-29 MED ORDER — CETIRIZINE HCL 10 MG PO TABS: 10.0000 mg | ORAL_TABLET | Freq: Two times a day (BID) | ORAL | 0 refills | Status: DC | PRN

## 2020-07-29 NOTE — Patient Instructions (Addendum)
Contact Dermatitis Dermatitis is redness, soreness, and swelling (inflammation) of the skin. Contact dermatitis is a reaction to certain substances that touch the skin. Many different substances can cause contact dermatitis. There are two types of contact dermatitis:  Irritant contact dermatitis. This type is caused by something that irritates your skin, such as having dry hands from washing them too often with soap. This type does not require previous exposure to the substance for a reaction to occur. This is the most common type.  Allergic contact dermatitis. This type is caused by a substance that you are allergic to, such as poison ivy. This type occurs when you have been exposed to the substance (allergen) and develop a sensitivity to it. Dermatitis may develop soon after your first exposure to the allergen, or it may not develop until the next time you are exposed and every time thereafter. What are the causes? Irritant contact dermatitis is most commonly caused by exposure to:  Makeup.  Soaps.  Detergents.  Bleaches.  Acids.  Metal salts, such as nickel. Allergic contact dermatitis is most commonly caused by exposure to:  Poisonous plants.  Chemicals.  Jewelry.  Latex.  Medicines.  Preservatives in products, such as clothing. What increases the risk? You are more likely to develop this condition if you have:  A job that exposes you to irritants or allergens.  Certain medical conditions, such as asthma or eczema. What are the signs or symptoms? Symptoms of this condition may occur on your body anywhere the irritant has touched you or is touched by you.  Symptoms include: ? Dryness or flaking. ? Redness. ? Cracks. ? Itching. ? Pain or a burning feeling. ? Blisters. ? Drainage of small amounts of blood or clear fluid from skin cracks. With allergic contact dermatitis, there may also be swelling in areas such as the eyelids, mouth, or genitals. How is this  diagnosed? This condition is diagnosed with a medical history and physical exam.  A patch skin test may be performed to help determine the cause.  If the condition is related to your job, you may need to see an occupational medicine specialist. How is this treated? This condition is treated by checking for the cause of the reaction and protecting your skin from further contact. Treatment may also include:  Steroid creams or ointments. Oral steroid medicines may be needed in more severe cases.  Antibiotic medicines or antibacterial ointments, if a skin infection is present.  Antihistamine lotion or an antihistamine taken by mouth to ease itching.  A bandage (dressing). Follow these instructions at home: Skin care  Moisturize your skin as needed.  Apply cool compresses to the affected areas.  Try applying baking soda paste to your skin. Stir water into baking soda until it reaches a paste-like consistency.  Do not scratch your skin, and avoid friction to the affected area.  Avoid the use of soaps, perfumes, and dyes. Medicines  Take or apply over-the-counter and prescription medicines only as told by your health care provider.  If you were prescribed an antibiotic medicine, take or apply the antibiotic as told by your health care provider. Do not stop using the antibiotic even if your condition improves. Bathing  Try taking a bath with: ? Epsom salts. Follow the instructions on the packaging. You can get these at your local pharmacy or grocery store. ? Baking soda. Pour a small amount into the bath as directed by your health care provider. ? Colloidal oatmeal. Follow the instructions on the   packaging. You can get this at your local pharmacy or grocery store.  Bathe less frequently, such as every other day.  Bathe in lukewarm water. Avoid using hot water. Bandage care  If you were given a bandage (dressing), change it as told by your health care provider.  Wash your hands  with soap and water before and after you change your dressing. If soap and water are not available, use hand sanitizer. General instructions  Avoid the substance that caused your reaction. If you do not know what caused it, keep a journal to try to track what caused it. Write down: ? What you eat. ? What cosmetic products you use. ? What you drink. ? What you wear in the affected area. This includes jewelry.  Check the affected areas every day for signs of infection. Check for: ? More redness, swelling, or pain. ? More fluid or blood. ? Warmth. ? Pus or a bad smell.  Keep all follow-up visits as told by your health care provider. This is important. Contact a health care provider if:  Your condition does not improve with treatment.  Your condition gets worse.  You have signs of infection such as swelling, tenderness, redness, soreness, or warmth in the affected area.  You have a fever.  You have new symptoms. Get help right away if:  You have a severe headache, neck pain, or neck stiffness.  You vomit.  You feel very sleepy.  You notice red streaks coming from the affected area.  Your bone or joint underneath the affected area becomes painful after the skin has healed.  The affected area turns darker.  You have difficulty breathing. Summary  Dermatitis is redness, soreness, and swelling (inflammation) of the skin. Contact dermatitis is a reaction to certain substances that touch the skin.  Symptoms of this condition may occur on your body anywhere the irritant has touched you or is touched by you.  This condition is treated by figuring out what caused the reaction and protecting your skin from further contact. Treatment may also include medicines and skin care.  Avoid the substance that caused your reaction. If you do not know what caused it, keep a journal to try to track what caused it.  Contact a health care provider if your condition gets worse or you have signs  of infection such as swelling, tenderness, redness, soreness, or warmth in the affected area. This information is not intended to replace advice given to you by your health care provider. Make sure you discuss any questions you have with your health care provider. Document Revised: 01/01/2019 Document Reviewed: 03/27/2018 Elsevier Patient Education  2020 [employer] Pruritus Pruritus is an itchy feeling on the skin. One of the most common causes is dry skin, but many different things can cause itching. Most cases of itching do not require medical attention. Sometimes itchy skin can turn into a rash. Follow these instructions at home: Skin care   Apply moisturizing lotion to your skin as needed. Lotion that contains petroleum jelly is best.  Take medicines or apply medicated creams only as told by your health care provider. This may include: ? Corticosteroid cream. ? Anti-itch lotions. ? Oral antihistamines.  Apply a cool, wet cloth (cool compress) to the affected areas.  Take baths with one of the following: ? Epsom salts. You can get these at your local pharmacy or grocery store. Follow the instructions on the packaging. ? Baking soda. Pour a small amount into the bath as told  by your health care provider. ? Colloidal oatmeal. You can get this at your local pharmacy or grocery store. Follow the instructions on the packaging.  Apply baking soda paste to your skin. To make the paste, stir water into a small amount of baking soda until it reaches a paste-like consistency.  Do not scratch your skin.  Do not take hot showers or baths, which can make itching worse. A cool shower may help with itching as long as you apply moisturizing lotion after the shower.  Do not use scented soaps, detergents, perfumes, and cosmetic products. Instead, use gentle, unscented versions of these items. General instructions  Avoid wearing tight clothes.  Keep a journal to help find out what is causing  your itching. Write down: ? What you eat and drink. ? What cosmetic products you use. ? What soaps or detergents you use. ? What you wear, including jewelry.  Use a humidifier. This keeps the air moist, which helps to prevent dry skin.  Be aware of any changes in your itchiness. Contact a health care provider if:  The itching does not go away after several days.  You are unusually thirsty or urinating more than normal.  Your skin tingles or feels numb.  Your skin or the white parts of your eyes turn yellow (jaundice).  You feel weak.  You have any of the following: ? Night sweats. ? Tiredness (fatigue). ? Weight loss. ? Abdominal pain. Summary  Pruritus is an itchy feeling on the skin. One of the most common causes is dry skin, but many different conditions and factors can cause itching.  Apply moisturizing lotion to your skin as needed. Lotion that contains petroleum jelly is best.  Take medicines or apply medicated creams only as told by your health care provider.  Do not take hot showers or baths. Do not use scented soaps, detergents, perfumes, or cosmetic products. This information is not intended to replace advice given to you by your health care provider. Make sure you discuss any questions you have with your health care provider. Document Revised: 09/25/2017 Document Reviewed: 09/25/2017 Else[employer] Patient Education  2020 [employer] IT sales professional, Adult An insect bite can make your skin red, itchy, and swollen. An insect bite is different from an insect sting, which happens when an insect injects poison (venom) into the skin. Some insects can spread disease to people through a bite. However, most insect bites do not lead to disease and are not serious. What are the causes? Insects may bite for a variety of reasons, including:  Hunger.  To defend themselves. Insects that bite include:  Spiders.  Mosquitoes.  Ticks.  Fleas.  Ants.  Flies.  Kissing  bugs.  Chiggers. What are the signs or symptoms? Symptoms of this condition include:  Itching or pain in the bite area.  Redness and swelling in the bite area.  An open wound (skin ulcer). In many cases, symptoms last for 2-4 days. In rare cases, a person may have a severe allergic reaction (anaphylactic reaction) to a bite. Symptoms of an anaphylactic reaction may include:  Feeling warm in the face (flushed). This may include redness.  Itchy, red, swollen areas of skin (hives).  Swelling of the eyes, lips, face, mouth, tongue, or throat.  Difficulty breathing, speaking, or swallowing.  Noisy breathing (wheezing).  Dizziness or light-headedness.  Fainting.  Pain or cramping in the abdomen.  Vomiting.  Diarrhea. How is this diagnosed? This condition is usually diagnosed based on symptoms and a  physical exam. How is this treated? Treatment is usually not needed. Symptoms often go away on their own. When treatment is recommended, it may involve:  Applying a cream or lotion to the bite area. This treatment helps with itching.  Taking an antibiotic medicine. This treatment is needed if the bite area gets infected.  Getting a tetanus shot, if you are not up to date on this vaccine.  Applying ice to the affected area.  Allergy medicines called antihistamines. This treatment may be needed if you develop itching or an allergic reaction to the insect bite.  Giving yourself an epinephrine injection if you have an anaphylactic reaction to a bite. To give the injection, you will use what is commonly called an auto-injector "pen" (pre-filled automatic epinephrine injection device). Your health care provider will teach you how to use an auto-injector pen. Follow these instructions at home: Bite area care   Do not scratch the bite area.  Keep the bite area clean and dry. Wash it every day with soap and water as told by your health care provider.  Check the bite area every day  for signs of infection. Check for: ? Redness, swelling, or pain. ? Fluid or blood. ? Warmth. ? Pus or a bad smell. Managing pain, itching, and swelling   You may apply cortisone cream, calamine lotion, or a paste made of baking soda and water to the bite area as told by your health care provider.  If directed, put ice on the bite area. ? Put ice in a plastic bag. ? Place a towel between your skin and the bag. ? Leave the ice on for 20 minutes, 2-3 times a day. General instructions  Apply or take over-the-counter and prescription medicines only as told by your health care provider.  If you were prescribed an antibiotic medicine, take or apply it as told by your health care provider. Do not stop using the antibiotic even if your condition improves.  Keep all follow-up visits as told by your health care provider. This is important. How is this prevented? To help reduce your risk of insect bites:  When you are outdoors, wear clothing that covers your arms and legs. This is especially important in the early morning and evening.  Use insect repellent. The best insect repellents contain DEET, picaridin, oil of lemon eucalyptus (OLE), or IR3535.  Consider spraying your clothing with a pesticide called permethrin. Permethrin helps prevent insect bites. It works for several weeks and for up to 5-6 clothing washes. Do not apply permethrin directly to the skin.  If your home windows do not have screens, consider installing them.  If you will be sleeping in an area where there are mosquitoes, consider covering your sleeping area with a mosquito net. Contact a health care provider if:  You have redness, swelling, or pain in the bite area.  You have fluid or blood coming from the bite area.  The bite area feels warm to the touch.  You have pus or a bad smell coming from the bite area.  You have a fever. Get help right away if:  You have joint pain.  You have a rash.  You feel  unusually tired or sleepy.  You have neck pain.  You have a headache.  You have unusual weakness.  You develop symptoms of an anaphylactic reaction. These may include: ? Flushed skin. ? Hives. ? Swelling of the eyes, lips, face, mouth, tongue, or throat. ? Difficulty breathing, speaking, or swallowing. ?  Wheezing. ? Dizziness or light-headedness. ? Fainting. ? Pain or cramping in the abdomen. ? Vomiting. ? Diarrhea. These symptoms may represent a serious problem that is an emergency. Do not wait to see if the symptoms will go away. Do the following right away:  Use the auto-injector pen as you have been instructed.  Get medical help. Call your local emergency services (911 in the [place] Do not drive yourself to the hospital. Summary  An insect bite can make your skin red, itchy, and swollen.  Treatment is usually not needed. Symptoms often go away on their own. When treatment is recommended, it may involve taking medicine, applying medicine to the area, or applying ice.  Apply or take over-the-counter and prescription medicines only as told by your health care provider.  Use insect repellent to help prevent insect bites.  Contact a health care provider if you have any signs of infection in the bite area. This information is not intended to replace advice given to you by your health care provider. Make sure you discuss any questions you have with your health care provider. Document Revised: 03/22/2018 Document Reviewed: 03/22/2018 [employer] Patient Education  2020 [employer]

## 2020-07-29 NOTE — Progress Notes (Signed)
Subjective:    Patient ID: Alexis Gordon, female    DOB: 08/12/1996, 24 y.o.   MRN: [id number]  24y/o Hispanic female established pt c/o rash/insect bites that appeared yesterday. Recent work in the yard. Has tried Hydrocortisone and aquaphor with no relief. Calagel lotion helped while in clinic.   Has been itching affected area today and it seems to be spreading on arm right only.  Denied fever/chills/n/v/d/arm weakness/trouble breathing.     Review of Systems  Constitutional: Negative for activity change, appetite change, chills, diaphoresis, fatigue and fever.  HENT: Negative for congestion, drooling, facial swelling, sneezing, trouble swallowing and voice change.   Eyes: Negative for photophobia, pain, discharge, redness, itching and visual disturbance.  Respiratory: Negative for cough, choking, chest tightness, shortness of breath, wheezing and stridor.   Cardiovascular: Negative for chest pain and leg swelling.  Gastrointestinal: Negative for diarrhea, nausea and vomiting.  Endocrine: Negative for cold intolerance and heat intolerance.  Genitourinary: Negative for difficulty urinating.  Musculoskeletal: Negative for arthralgias, back pain, gait problem, myalgias, neck pain and neck stiffness.  Skin: Positive for color change and rash. Negative for pallor and wound.  Allergic/Immunologic: Positive for environmental allergies. Negative for food allergies.  Neurological: Negative for dizziness, tremors, syncope, speech difficulty, weakness, light-headedness and headaches.  Hematological: Negative for adenopathy. Does not bruise/bleed easily.  Psychiatric/Behavioral: Negative for agitation, confusion and sleep disturbance.       Objective:   Physical Exam Vitals and nursing note reviewed.  Constitutional:      General: She is awake. She is not in acute distress.    Appearance: Normal appearance. She is well-developed, well-groomed and normal weight. She is not ill-appearing,  toxic-appearing or diaphoretic.  HENT:     Head: Normocephalic and atraumatic.     Jaw: There is normal jaw occlusion.     Salivary Glands: Right salivary gland is not diffusely enlarged. Left salivary gland is not diffusely enlarged.     Right Ear: Hearing and external ear normal.     Left Ear: Hearing and external ear normal.     Nose: Nose normal. No congestion or rhinorrhea.     Mouth/Throat:     Mouth: No angioedema.     Pharynx: Oropharynx is clear.  Eyes:     General: Lids are normal. Vision grossly intact. Gaze aligned appropriately. No allergic shiner, visual field deficit or scleral icterus.       Right eye: No discharge.        Left eye: No discharge.     Extraocular Movements: Extraocular movements intact.     Conjunctiva/sclera: Conjunctivae normal.     Pupils: Pupils are equal, round, and reactive to light.  Neck:     Trachea: Trachea and phonation normal. No abnormal tracheal secretions or tracheal deviation.  Cardiovascular:     Rate and Rhythm: Normal rate and regular rhythm.     Pulses: Normal pulses.          Radial pulses are 2+ on the right side and 2+ on the left side.  Pulmonary:     Effort: Pulmonary effort is normal. No respiratory distress.     Breath sounds: Normal breath sounds and air entry. No stridor or transmitted upper airway sounds. No wheezing or rhonchi.     Comments: Wearing mask due to covid 19 pandemic; spoke full sentences without difficulty; no cough noted in exam room Abdominal:     General: Abdomen is flat.     Palpations: Abdomen is  soft.  Musculoskeletal:        General: Swelling present. No tenderness, deformity or signs of injury. Normal range of motion.     Right shoulder: Normal. No swelling, deformity, effusion or laceration. Normal range of motion.     Left shoulder: Normal. No swelling, deformity, effusion or laceration. Normal range of motion.     Right elbow: Normal. No swelling, deformity, effusion or lacerations. Normal range  of motion. No tenderness.     Left elbow: Normal. No swelling, deformity, effusion or lacerations. Normal range of motion. No tenderness.     Right forearm: Swelling and edema present. No deformity, lacerations or bony tenderness.     Left forearm: Normal. No swelling, edema, deformity or lacerations.     Right hand: Normal. No swelling, deformity or lacerations. Normal range of motion.     Left hand: Normal. No swelling, deformity or lacerations. Normal range of motion.       Arms:     Cervical back: Normal, normal range of motion and neck supple. No swelling, edema, deformity, erythema, signs of trauma, lacerations, rigidity, torticollis, tenderness or crepitus. No pain with movement. Normal range of motion.     Thoracic back: Normal. No swelling, edema, deformity, signs of trauma or lacerations. Normal range of motion.     Lumbar back: Normal. No swelling, edema, deformity, signs of trauma or lacerations. Normal range of motion.     Right hip: Normal. No deformity or lacerations. Normal range of motion.     Left hip: Normal. No deformity or lacerations. Normal range of motion.     Right knee: Normal. No swelling, deformity or effusion.     Left knee: Normal. No swelling, deformity or effusion.     Right lower leg: No edema.     Left lower leg: No edema.  Lymphadenopathy:     Head:     Right side of head: No submental, submandibular, tonsillar, preauricular, posterior auricular or occipital adenopathy.     Left side of head: No submental, submandibular, tonsillar, preauricular, posterior auricular or occipital adenopathy.     Cervical: No cervical adenopathy.     Right cervical: No superficial cervical adenopathy.    Left cervical: No superficial cervical adenopathy.  Skin:    General: Skin is warm and dry.     Capillary Refill: Capillary refill takes less than 2 seconds.     Coloration: Skin is not ashen, cyanotic, jaundiced, mottled, pale or sallow.     Findings: Abrasion, erythema  and rash present. No abscess, acne, bruising, burn, ecchymosis, laceration, lesion, petechiae or wound. Rash is macular and papular. Rash is not crusting, nodular, purpuric, pustular, scaling, urticarial or vesicular.     Nails: There is no clubbing.          Comments: Right anterior forearm with approximately 6 scattered papules and macular erythema nonpitting edema 1+/4 localized under erythema; noted patient continually rubbing/scratching affected area while talking to clinic staff  Neurological:     General: No focal deficit present.     Mental Status: She is alert and oriented to person, place, and time. Mental status is at baseline.     GCS: GCS eye subscore is 4. GCS verbal subscore is 5. GCS motor subscore is 6.     Cranial Nerves: Cranial nerves are intact. No cranial nerve deficit, dysarthria or facial asymmetry.     Sensory: Sensation is intact. No sensory deficit.     Motor: Motor function is intact. No weakness, tremor,  atrophy, abnormal muscle tone or seizure activity.     Coordination: Coordination is intact. Coordination normal.     Gait: Gait is intact. Gait normal.     Comments: Gait sure and steady in clinic; in/out of chair without difficulty; bilateral hand grasp equal 5/5  Psychiatric:        Attention and Perception: Attention and perception normal.        Mood and Affect: Mood and affect normal.        Speech: Speech normal.        Behavior: Behavior normal. Behavior is cooperative.        Thought Content: Thought content normal.        Cognition and Memory: Cognition and memory normal.        Judgment: Judgment normal.           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  Hydrocortisone didn't work with aquaphor earlier today to decrease itching/rash.  Trial of calagel after  check in for appt with RN Rolly Salter and patient reported relief within 5 minutes.  Area still macular erythema surrounding papules area 4x9cm affected.  Discussed avoid steam showers.  Avoid scratching.  Shower tepid after work to clear off any chemicals/dust from work.  Consider zyrtec 10mg  po BID prn itching.  calagel thin smear BID affected area topical prn itching given 4 UD from clinic stock; do not get in eyes; if worsening with calagel use stop  Wash hands before and after application.  Avoid hot steam showers.  Apply emollient twice a day e.g. Fragrance free vaseline, aquaphor.  May apply ice/cold compress 5 minutes QID prn itching/swelling.  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  Shower when she finishes work/prior to bedtime.  Avoid harsh/abrasive soaps use fragrance free/sensitive like dove/cetaphil.    Medication as directed.  If spreading/itching not controlled with above then start prednisone 10mg  taper po with breakfastTake 3 tablets (30 mg total) by mouth daily with breakfast for 3 days, THEN 2 tablets (20 mg total) daily with breakfast for 4 days, THEN 1 tablet (10 mg total) daily with breakfast for 2 days, THEN 0.5 tablets (5 mg total) daily with breakfast for 2 days #21 RF0 dispensed from [facility] to patient.    Call or return to clinic as needed if these symptoms worsen or fail to improve as anticipated. Exitcare handouts printed and given on insect bites, contact dermatitis and pruritis  Follow up for re-evaluation with RN [place] tomorrow if no improvement and/or worsening of rash with plan of care. Patient verbalized agreement and understanding of treatment plan and had no further questions at this time

## 2020-08-30 ENCOUNTER — Encounter: Payer: Self-pay | Admitting: Registered Nurse

## 2020-08-30 ENCOUNTER — Telehealth: Payer: Self-pay | Admitting: Registered Nurse

## 2020-08-30 NOTE — Telephone Encounter (Signed)
Notified by [employer] HR team patient left work after starting shift not feeling well dizzy and nauseated and to please contact her via telephone to follow up.  Telephone message left for patient she can reach me via my chart, email [email or web address] or call clinic tomorrow after [phone].  RN Rolly Salter not in clinic today but to return tomorrow also therefore clinic closed today.

## 2020-08-31 NOTE — Telephone Encounter (Signed)
Notified by RN Rolly Salter patient reported positive home pregnancy test today approximately 4 weeks since last menses.  Patient instructed to follow up with PCM by RN Rolly Salter and schedule OB appt.  Having some nausea still.  HR notified personal medical condition and no further covid quarantine/testing required.

## 2020-09-06 NOTE — Telephone Encounter (Signed)
Verified with pt's supervisor that she did RTW 09/01/20 as expected.

## 2020-09-06 NOTE — Telephone Encounter (Signed)
Noted patient RTW as expected. 

## 2020-09-23 ENCOUNTER — Telehealth: Payer: Self-pay | Admitting: *Deleted

## 2020-09-23 NOTE — Telephone Encounter (Addendum)
RN notified by HR that pt reported recent exposure to positive person. Last exposure 12/26. Person's sx started 12/28. Pt denies any current sx. She is vaccinated x2 in last 6 months. Following current CDC recommendations, pt can still work on-site but must wear a mask around others for 10 days and test on day 5 if possible. Looked for test appts with pt on phone. First available Wed 1/5. Pt to self schedule as she does not have insurance information with her. Advised her that she will need to eat in her vehicle or outside during self-monitoring period thru 09/28/20. She is agreeable with this.

## 2020-09-23 NOTE — Telephone Encounter (Signed)
Reviewed RN Haley note agreed with plan of care. 

## 2020-09-24 NOTE — Telephone Encounter (Signed)
RN notified by HR that pt left work for the day after having n/v this morning. She reported to supervisor she thought r/t pregnancy. Sx started this morning. She has not had morning sickness with previous pregnancies. She tried to eat this morning and feels it didn't sit well. She went home and took a nap. Feels much better after waking. Nausea milder, no additional emesis. Suggested she try toast, crackers, ginger ale, schedule with ob/gyn.  No concern for covid related sx at this time. Noted covid test scheduled through [facility] 09/27/20.

## 2020-09-27 ENCOUNTER — Other Ambulatory Visit: Payer: BC Managed Care – PPO

## 2020-09-27 DIAGNOSIS — Z20822 Contact with and (suspected) exposure to covid-19: Secondary | ICD-10-CM

## 2020-09-28 LAB — NOVEL CORONAVIRUS, NAA: SARS-CoV-2, NAA: NOT DETECTED

## 2020-09-28 LAB — SARS-COV-2, NAA 2 DAY TAT

## 2020-09-28 NOTE — Telephone Encounter (Signed)
Reviewed RN Rolly Salter note. Patient completed testing today

## 2020-09-30 NOTE — Telephone Encounter (Signed)
Noted results in [facility] of negative Covid test from 1/3. Spoke with pt by phone. She reports no additional sx, only continued morning sickness r/t pregnancy. Denies further needs/concerns. Closing encounter.

## 2020-09-30 NOTE — Telephone Encounter (Signed)
Reviewed RN Rolly Salter note negative covid test and patient returned to work still having morning sickness.

## 2020-10-07 ENCOUNTER — Telehealth: Payer: Self-pay | Admitting: *Deleted

## 2020-10-07 NOTE — Telephone Encounter (Signed)
RN notified by supervisor that pt did not feel well yesterday while at work. Spoke with pt today. She reports "did not feel like myself at all yesterday." Fatigue, HA,   UC today child with vomiting. Covid test pending for child. Mother in law that takes child(ren) to school has Covid.   Day 0 10/06/20 Day 5 10/11/20 RTW 1/18 with strict mask use thru 10/16/20  Testing tomorrow 10/08/20 at 0930 at [facility].

## 2020-10-08 ENCOUNTER — Telehealth: Payer: Self-pay | Admitting: *Deleted

## 2020-10-08 ENCOUNTER — Other Ambulatory Visit: Payer: BC Managed Care – PPO

## 2020-10-08 ENCOUNTER — Encounter: Payer: Self-pay | Admitting: *Deleted

## 2020-10-08 ENCOUNTER — Other Ambulatory Visit: Payer: Self-pay

## 2020-10-08 DIAGNOSIS — Z20822 Contact with and (suspected) exposure to covid-19: Secondary | ICD-10-CM

## 2020-10-08 DIAGNOSIS — O3680X0 Pregnancy with inconclusive fetal viability, not applicable or unspecified: Secondary | ICD-10-CM

## 2020-10-08 NOTE — Telephone Encounter (Signed)
Reviewed RN Haley note agreed with plan of care. 

## 2020-10-08 NOTE — Telephone Encounter (Signed)
Message received from [facility] @ [facility] re: referral. She reported that pt was seen @ their location on 1/5. She had +UPT and LMP reported of 08/03/20 which would have made her [redacted]w[redacted]d. Pt had [place] on same Christal Lagerstrom which was inconclusive - fetal pole was visualized measuring [redacted]w[redacted]d and cardiac activity was absent. [place] was repeated yesterday (abdominal and vaginal) which showed no fetal pole. Pt denies having bleeding or cramping @ any point in time. She also reported that she had IUD removed in October 2021. Pt status was reviewed with Dr. Macon Large who recommends pt needs non-stat BHCG next week.   1129  I called pt and discussed recommendation for follow up care. Initially, she was offered to have lab draw in office today if she could arrive within 30 minutes. It was then learned that pt has Sx of Covid and had test performed this morning @ [facility] - results are pending. Pt was advised that we will need to wait until our next business [facility] (1/18) for the lab test so that Covid results can be known. She will be notified pf appt time via Mychart. Pt was advised to go to [facility] for evaluation immediately if she develops heavy vaginal bleeding or abdominal/pelvic pain. Pt voiced understanding of all information and instructions given.

## 2020-10-09 NOTE — Telephone Encounter (Signed)
Patient contacted via telephone stated symptoms resolved but still waiting for her test results was told 3-5 days for results to return when she was tested 1/13.  Daughter test results came back negative from urgent care.  Patient feeling well today no concerns or questions.  Discussed RTW 1/18 pending test results.  Patient A&Ox3 respirations even and unlabored no throat clearing/nasal sniffing or cough during telephone call duration 3 minutes.  HR notified test still pending RTW 1/18 pending.  Patient verbalized understanding information/instructions, agreed with plan of care and had no further questions at this time.

## 2020-10-10 LAB — NOVEL CORONAVIRUS, NAA: SARS-CoV-2, NAA: NOT DETECTED

## 2020-10-10 LAB — SARS-COV-2, NAA 2 DAY TAT

## 2020-10-11 NOTE — Telephone Encounter (Signed)
No answer. LVMRCB. 

## 2020-10-11 NOTE — Telephone Encounter (Signed)
Pt returned call. She reports all sx have resolved. Negative test. Cleared to RTW tomorrow 1/18 as planned with strict mask use thru 1/22.

## 2020-10-12 ENCOUNTER — Other Ambulatory Visit: Payer: Self-pay

## 2020-10-12 ENCOUNTER — Other Ambulatory Visit: Payer: BC Managed Care – PPO

## 2020-10-12 DIAGNOSIS — O3680X0 Pregnancy with inconclusive fetal viability, not applicable or unspecified: Secondary | ICD-10-CM

## 2020-10-12 NOTE — Telephone Encounter (Signed)
Reviewed RN Rolly Salter note agree with plan of care and patient has follow up with OB/labs drawn today.

## 2020-10-12 NOTE — Telephone Encounter (Signed)
Noted negative test agree with plan of care and strict mask use/no use of employee lunch room during this time of strict mask use must eat in car or outsidte.

## 2020-10-12 NOTE — Telephone Encounter (Signed)
Pt reports by phone that she is out of work today due to icy roads. Plans to RTW tomorrow 1/19.

## 2020-10-13 ENCOUNTER — Telehealth: Payer: Self-pay | Admitting: *Deleted

## 2020-10-13 DIAGNOSIS — O3680X0 Pregnancy with inconclusive fetal viability, not applicable or unspecified: Secondary | ICD-10-CM

## 2020-10-13 LAB — BETA HCG QUANT (REF LAB): hCG Quant: 87925 m[IU]/mL

## 2020-10-13 NOTE — Telephone Encounter (Addendum)
-----   Message from Tereso Newcomer, MD sent at 10/13/2020 10:00 AM EST ----- Patient needs non-stat follow up ultrasound for viability.  Please call to inform patient of results and recommendations.  1/19 1016  [place] scheduled on 1/31 @ 0900. Pt was notified of lab results and [place] appt. She was reminded to go to [facility] if she develops vaginal bleeding or abdominal cramping/pelvic pain. Pt voiced understanding of all information given.

## 2020-10-14 NOTE — Telephone Encounter (Signed)
Confirmed with HR that pt did RTW 10/13/20. Closing encounter. 

## 2020-10-14 NOTE — Telephone Encounter (Signed)
Noted RTW 1/19 and strict mask use through 10/16/2020

## 2020-10-25 ENCOUNTER — Encounter: Payer: Self-pay | Admitting: Certified Nurse Midwife

## 2020-10-25 ENCOUNTER — Other Ambulatory Visit: Payer: Self-pay | Admitting: Family Medicine

## 2020-10-25 ENCOUNTER — Other Ambulatory Visit: Payer: Self-pay

## 2020-10-25 ENCOUNTER — Ambulatory Visit
Admission: RE | Admit: 2020-10-25 | Discharge: 2020-10-25 | Disposition: A | Discharge status: Home / Self Care | Payer: BC Managed Care – PPO | Source: Ambulatory Visit | Attending: Obstetrics & Gynecology | Admitting: Obstetrics & Gynecology

## 2020-10-25 ENCOUNTER — Ambulatory Visit (INDEPENDENT_AMBULATORY_CARE_PROVIDER_SITE_OTHER): Payer: BC Managed Care – PPO | Admitting: Certified Nurse Midwife

## 2020-10-25 ENCOUNTER — Other Ambulatory Visit: Payer: Self-pay | Admitting: Obstetrics & Gynecology

## 2020-10-25 VITALS — BP 131/82 | HR 84 | Wt 144.0 lb

## 2020-10-25 DIAGNOSIS — O034 Incomplete spontaneous abortion without complication: Secondary | ICD-10-CM

## 2020-10-25 DIAGNOSIS — O3680X0 Pregnancy with inconclusive fetal viability, not applicable or unspecified: Secondary | ICD-10-CM

## 2020-10-25 MED ORDER — MISOPROSTOL 200 MCG PO TABS: ORAL_TABLET | ORAL | 0 refills | Status: DC

## 2020-10-25 MED ORDER — ONDANSETRON 4 MG PO TBDP: 4.0000 mg | ORAL_TABLET | Freq: Three times a day (TID) | ORAL | 0 refills | Status: DC | PRN

## 2020-10-25 MED ORDER — HYDROCODONE-IBUPROFEN 5-200 MG PO TABS: 1.0000 | ORAL_TABLET | Freq: Three times a day (TID) | ORAL | 0 refills | Status: DC | PRN

## 2020-10-25 NOTE — Patient Instructions (Signed)
Incomplete Miscarriage An incomplete miscarriage happens when tissue from pregnancy or part of the placenta, known as products of conception, remain in the body after a miscarriage. A miscarriage is the loss of pregnancy before the 20th week. Most miscarriages happen in the first 3 months of pregnancy. Sometimes, a miscarriage happens before a woman knows she is pregnant. Having a miscarriage can be an emotional experience. If you have had a miscarriage, talk with your health care provider about any questions you may have about:  The loss of your baby.  The grieving process.  Your future pregnancy plans. What are the causes? Many times, the cause of an incomplete miscarriage is not known. What increases the risk? The following factors may make a pregnant woman more likely to have an incomplete miscarriage:  Using a watch-and-wait approach (expectant management) to treat a miscarriage.  Using medicine to treat a miscarriage. What are the signs or symptoms? Symptoms of this condition include:  Vaginal bleeding or spotting, with or without cramps or pain.  Pain or cramping in the abdomen or lower back.  Fluid or tissue coming out of the vagina. How is this diagnosed? This condition may be diagnosed based on:  A physical exam.  Ultrasound. How is this treated? An incomplete miscarriage may be treated with:  Dilation and curettage (D&C). In this procedure, the cervix is stretched open and any remaining pregnancy tissue is removed from the lining of the uterus (endometrium). The cervix is the lowest part of the uterus, which opens into the vagina.  Medicines. These may include: ? Antibiotic medicine, to treat infection. ? Medicine to help any remaining pregnancy tissue come out of the uterus. ? Medicine to reduce (contract) the size of the uterus. These medicines may be given if there is a lot of bleeding. If you have Rh-negative blood, you may be given an injection of Rho(D) immune  globulin to help prevent problems with future pregnancies. Follow these instructions at home: Medicines  Take over-the-counter and prescription medicines only as told by your health care provider.  If you were prescribed antibiotic medicine, take your antibiotic as told by your health care provider. Do not stop taking the antibiotic even if you start to feel better. Activity  Rest as told by your health care provider. Ask your health care provider what activities are safe for you.  Have someone help with home and family responsibilities during this time. General instructions  Monitor how much tissue or blood comes out of the vagina.  Do not have sex, douche, or put anything in your vagina, such as tampons, until your health care provider says it is okay.  To help you and your partner with the grieving process, talk with your health care provider or get counseling to help deal with the pregnancy loss.  When you are ready, meet with your health care provider to discuss any important steps you should take for your health. Also, discuss steps you should take to have a healthy pregnancy in the future.  Keep all follow-up visits. This is important.   Where to find more information  The American College of Obstetricians and Gynecologists: acog.org  [place] Department of Health and Human Services Office of Women's Health: hrsa.gov/office-womens-health Contact a health care provider if:  You have a fever or chills.  There is bad-smelling fluid coming from your vagina.  You have more bleeding instead of less.  Tissue or blood clots come out of your vagina. Get help right away if:  You   have severe cramps or pain in your back or abdomen.  Heavy bleeding soaks through 2 large sanitary pads an hour for more than 2 hours.  You become light-headed or weak.  You faint.  You feel sad, and your sadness takes over your thoughts.  You think about hurting yourself. If you ever feel like you  may hurt yourself or others, or have thoughts about taking your own life, get help right away. Go to your nearest emergency department or:  Call your local emergency services (911 in the [place]  Call a suicide crisis helpline, such as the National Suicide Prevention Lifeline at 1-800-273-8255. This is open 24 hours a day in the [place]  Text the Crisis Text Line at 741741 (in the [place] Summary  An incomplete miscarriage happens when tissue from pregnancy or part of the placenta, known as products of conception, remain in the body after a miscarriage.  Treatment may include a dilation and curettage (D&C) procedure or medicines. In a D&C procedure, tissue is removed from the uterus.  Rest as told by your health care provider. Ask your health care provider what activities are safe for you.  To help you and your partner with the grieving process, talk with your health care provider or get counseling to help deal with the pregnancy loss. This information is not intended to replace advice given to you by your health care provider. Make sure you discuss any questions you have with your health care provider. Document Revised: 03/12/2020 Document Reviewed: 03/12/2020 Elsevier Patient Education  2021 [employer].  

## 2020-10-25 NOTE — Progress Notes (Signed)
Contacted by after hours line Patient unable to obtain misoprostol at pharmacy, requested alternative location Re sent rx to pharmacy

## 2020-10-25 NOTE — Progress Notes (Signed)
History:  Ms. Alexis Gordon is a 25 y.o. [id number] who presents to clinic today from ultrasound appointment upstairs.    [facility] and reports having ultrasound and labs - unable to see anything on that ultrasound and PCN sent referral to [facility] for follow up. Patient reports that ultrasound 2-3 weeks ago.   Patient denies any vaginal bleeding or abdominal pain.   The following portions of the patient's history were reviewed and updated as appropriate: allergies, current medications, family history, past medical history, social history, past surgical history and problem list.  Review of Systems:  Review of Systems  Constitutional: Negative.   Respiratory: Negative.   Cardiovascular: Negative.   Gastrointestinal: Negative.   Genitourinary: Negative.   Neurological: Negative.      Objective:  Physical Exam BP 131/82   Pulse 84   Wt 144 lb (65.3 kg)   LMP 08/03/2020 (LMP Unknown)   BMI 26.34 kg/m  Physical Exam Vitals reviewed.  Constitutional:      Appearance: Normal appearance.  HENT:     Head: Normocephalic.  Cardiovascular:     Rate and Rhythm: Normal rate and regular rhythm.  Pulmonary:     Effort: Pulmonary effort is normal. No respiratory distress.     Breath sounds: Normal breath sounds. No wheezing.  Abdominal:     General: There is no distension.     Palpations: Abdomen is soft.     Tenderness: There is no abdominal tenderness. There is no guarding.  Musculoskeletal:     Right lower leg: No edema.     Left lower leg: No edema.  Skin:    General: Skin is warm and dry.  Neurological:     Mental Status: She is alert.  Psychiatric:        Mood and Affect: Mood normal.        Behavior: Behavior normal.        Thought Content: Thought content normal.    Labs and Imaging No results found for this or any previous visit (from the past 24 hour(s)).  US OB LESS THAN 14 WEEKS WITH OB TRANSVAGINAL  Result Date:  10/25/2020 CLINICAL DATA:  Assess viability. Clinical gestational age by last menstrual period is 11 weeks and 6 days. EXAM: OBSTETRIC <14 WK [place] AND TRANSVAGINAL OB US TECHNIQUE: Both transabdominal and transvaginal ultrasound examinations were performed for complete evaluation of the gestation as well as the maternal uterus, adnexal regions, and pelvic cul-de-sac. Transvaginal technique was performed to assess early pregnancy. COMPARISON:  None. FINDINGS: Intrauterine gestational sac: Single Yolk sac:  Not Visualized. Embryo:  Not visualized MSD: 35.3 mm   8 w   5 d Maternal uterus/adnexae: Subchorionic hemorrhage: None Right ovary: Normal Left ovary: Normal with corpus luteum Other :None Free fluid:  None IMPRESSION: 1. There is a single intrauterine gestational sac with a mean sac diameter of 35.3 mm. No yolk sac or embryo visualized. Findings meet definitive criteria for failed pregnancy. This follows SRU consensus guidelines: Diagnostic Criteria for Nonviable Pregnancy Early in the First Trimester. [employer] Mis J Med [phone]. These results will be called to the ordering clinician or representative by the Radiologist Assistant, and communication documented in the PACS or [employer]. Electronically Signed   By: Signa Kell M.D.   On: 10/25/2020 09:48   Assessment & Plan:  1. Incomplete miscarriage - Educated and discussed with patient ultrasound results with patient and partner  - Patient emotional about results and  given time to grieve with partner  - Educated and discussed expectant management vs medical management, patient agrees to medical management  - Discussed with patient warning signs and reasons to present to MAU for evaluation  - Patient is O Pos, no Rhogam indicated  - Reviewed with pt cytotec procedure.  Pt verbalizes that she lives close to the hospital and has transportation readily available.  Pt appears reliable and verbalizes understanding and agrees with plan of care, Rx  for medication sent to pharmacy of choice.  - Patient to have follow up next week in office  - Beta hCG quant (ref lab) - CBC - misoprostol (CYTOTEC) 200 MCG tablet; Place four tablets in between your gums and cheeks (two tablets on each side) as instructed  Dispense: 4 tablet; Refill: 0 - ondansetron (ZOFRAN ODT) 4 MG disintegrating tablet; Take 1 tablet (4 mg total) by mouth every 8 (eight) hours as needed for nausea or vomiting.  Dispense: 15 tablet; Refill: 0 - hydrocodone-ibuprofen (VICOPROFEN) 5-200 MG tablet; Take 1 tablet by mouth every 8 (eight) hours as needed for pain.  Dispense: 10 tablet; Refill: 0   Sharyon Cable, [place] 10/25/2020 11:26 AM

## 2020-10-26 LAB — CBC
Hematocrit: 38.6 % (ref 34.0–46.6)
Hemoglobin: 13.3 g/dL (ref 11.1–15.9)
MCH: 32.3 pg (ref 26.6–33.0)
MCHC: 34.5 g/dL (ref 31.5–35.7)
MCV: 94 fL (ref 79–97)
Platelets: 212 10*3/uL (ref 150–450)
RBC: 4.12 x10E6/uL (ref 3.77–5.28)
RDW: 11.8 % (ref 11.7–15.4)
WBC: 8.7 10*3/uL (ref 3.4–10.8)

## 2020-10-26 LAB — BETA HCG QUANT (REF LAB): hCG Quant: 72780 m[IU]/mL

## 2020-10-28 NOTE — Telephone Encounter (Signed)
US OB LESS THAN 14 WEEKS WITH OB TRANSVAGINAL   Result Date: 10/25/2020 CLINICAL DATA:  Assess viability. Clinical gestational age by last menstrual period is 11 weeks and 6 days. EXAM: OBSTETRIC <14 WK [place] AND TRANSVAGINAL OB US TECHNIQUE: Both transabdominal and transvaginal ultrasound examinations were performed for complete evaluation of the gestation as well as the maternal uterus, adnexal regions, and pelvic cul-de-sac. Transvaginal technique was performed to assess early pregnancy. COMPARISON:  None. FINDINGS: Intrauterine gestational sac: Single Yolk sac:  Not Visualized. Embryo:  Not visualized MSD: 35.3 mm   8 w   5 d Maternal uterus/adnexae: Subchorionic hemorrhage: None Right ovary: Normal Left ovary: Normal with corpus luteum Other :None Free fluid:  None IMPRESSION: 1. There is a single intrauterine gestational sac with a mean sac diameter of 35.3 mm. No yolk sac or embryo visualized. Findings meet definitive criteria for failed pregnancy. This follows SRU consensus guidelines: Diagnostic Criteria for Nonviable Pregnancy Early in the First Trimester. [employer] Mis J [facility] [phone]. These results will be called to the ordering clinician or representative by the Radiologist Assistant, and communication documented in the PACS or [employer]. Electronically Signed   By: Signa Kell M.D.   On: 10/25/2020 09:48    Assessment & Plan:  1. Incomplete miscarriage - Educated and discussed with patient ultrasound results with patient and partner  - Patient emotional about results and given time to grieve with partner  - Educated and discussed expectant management vs medical management, patient agrees to medical management  - Discussed with patient warning signs and reasons to present to MAU for evaluation  - Patient is O Pos, no Rhogam indicated  - Reviewed with pt cytotec procedure.  Pt verbalizes that she lives close to the hospital and has transportation readily available.  Pt appears reliable  and verbalizes understanding and agrees with plan of care, Rx for medication sent to pharmacy of choice.  - Patient to have follow up next week in office  - Beta hCG quant (ref lab) - CBC - misoprostol (CYTOTEC) 200 MCG tablet; Place four tablets in between your gums and cheeks (two tablets on each side) as instructed  Dispense: 4 tablet; Refill: 0 - ondansetron (ZOFRAN ODT) 4 MG disintegrating tablet; Take 1 tablet (4 mg total) by mouth every 8 (eight) hours as needed for nausea or vomiting.  Dispense: 15 tablet; Refill: 0 - hydrocodone-ibuprofen (VICOPROFEN) 5-200 MG tablet; Take 1 tablet by mouth every 8 (eight) hours as needed for pain.  Dispense: 10 tablet; Refill: 0     Sharyon Cable, [place] 10/25/2020 11:26 AM

## 2020-11-04 ENCOUNTER — Other Ambulatory Visit: Payer: Self-pay

## 2020-11-04 ENCOUNTER — Encounter: Payer: Self-pay | Admitting: Certified Nurse Midwife

## 2020-11-04 ENCOUNTER — Ambulatory Visit (INDEPENDENT_AMBULATORY_CARE_PROVIDER_SITE_OTHER): Payer: BC Managed Care – PPO | Admitting: Certified Nurse Midwife

## 2020-11-04 ENCOUNTER — Encounter: Payer: BC Managed Care – PPO | Admitting: Certified Nurse Midwife

## 2020-11-04 VITALS — BP 126/87 | HR 83 | Wt 144.4 lb

## 2020-11-04 DIAGNOSIS — O039 Complete or unspecified spontaneous abortion without complication: Secondary | ICD-10-CM

## 2020-11-04 DIAGNOSIS — Z8759 Personal history of other complications of pregnancy, childbirth and the puerperium: Secondary | ICD-10-CM | POA: Diagnosis not present

## 2020-11-04 NOTE — Progress Notes (Signed)
   Subjective:   Patient Name: Alexis Gordon, female   DOB: 18-Jul-1996, 25 y.o.  MRN: [id number]  Confirmed SAB on 10/25/20, prescribed cytotec which the pt had at home. She had heavy bleeding with clots for 36hrs then it decreased significantly. She is still having some light dark brown flow and only occasional cramping. Could not pick up her pain meds so is using Tylenol for the cramping. Menses has not returned yet. Plans on becoming pregnant soon.    Review of Systems Symptoms noted in HPI, a full review of systems is otherwise negative.    Objective:   Physical Exam  Constitutional: She is oriented to person, place, and time. She appears well-developed and well-nourished.  Cardiovascular: Normal rate.  Abdominal: Soft. She exhibits no distension.  Neurological: She is alert and oriented to person, place, and time.  Skin: Skin is warm and dry.  Psychiatric: She has a normal mood and affect. Her behavior is normal. Judgment and thought content normal.   Assessment & Plan:  1. SAB (spontaneous abortion) Patient counseled regarding future pregnancy plans and timing - recommended a 22mo interval between pregnancy attempts.   Gave reassurance regarding future fertility. Pt asked if we could do an early U/S to confirm viability, encouraged her to call us when she gets a positive pregnancy test and we will schedule a viability scan.   bHCG and CBC drawn, will manage accordingly.  Edd Arbour, CNM, MSN, IBCLC Certified Nurse Midwife, [facility]

## 2020-11-05 LAB — CBC
Hematocrit: 33.9 % — ABNORMAL LOW (ref 34.0–46.6)
Hemoglobin: 11.5 g/dL (ref 11.1–15.9)
MCH: 32.1 pg (ref 26.6–33.0)
MCHC: 33.9 g/dL (ref 31.5–35.7)
MCV: 95 fL (ref 79–97)
Platelets: 245 10*3/uL (ref 150–450)
RBC: 3.58 x10E6/uL — ABNORMAL LOW (ref 3.77–5.28)
RDW: 12.3 % (ref 11.7–15.4)
WBC: 7.2 10*3/uL (ref 3.4–10.8)

## 2020-11-05 LAB — BETA HCG QUANT (REF LAB): hCG Quant: 3754 m[IU]/mL

## 2020-11-08 ENCOUNTER — Telehealth: Payer: Self-pay

## 2020-11-08 NOTE — Telephone Encounter (Signed)
Bernerd Limbo, CNM  P [facility] Pool Good Morning - can you get this pt scheduled for a 1wk hcg retest? Thank you!   Call placed to pt. Spoke with pt. Pt given results and recommendations per Tyler Aas, CNM. Pt verbalized understanding and agreeable. Pt scheduled for nurse visit on 11/11/2020 at 130pm. Pt agreeable to date and time of appt. Front office sent message to place on schedule.   Judeth Cornfield, RN BSN  11/08/20

## 2020-11-11 ENCOUNTER — Ambulatory Visit: Payer: BC Managed Care – PPO

## 2020-11-11 ENCOUNTER — Other Ambulatory Visit: Payer: Self-pay

## 2020-11-11 ENCOUNTER — Ambulatory Visit (INDEPENDENT_AMBULATORY_CARE_PROVIDER_SITE_OTHER): Payer: BC Managed Care – PPO

## 2020-11-11 VITALS — BP 105/68 | HR 81 | Wt 142.5 lb

## 2020-11-11 DIAGNOSIS — O039 Complete or unspecified spontaneous abortion without complication: Secondary | ICD-10-CM

## 2020-11-11 NOTE — Progress Notes (Signed)
Pt here for repeat beta HCG per Edd Arbour, CNM. Pt reports continued vaginal spotting, denies any pain. Reiterated recommendations for pregnancy spacing and when to return to office. Explained pt will be contacted with results and any needed follow up.   Fleet Contras RN 11/11/20

## 2020-11-11 NOTE — Progress Notes (Signed)
Patient was assessed and managed by nursing staff during this encounter. I have reviewed the chart and agree with the documentation and plan.   Bernerd Limbo, CNM 11/11/2020 5:45 PM

## 2020-11-12 ENCOUNTER — Telehealth: Payer: Self-pay

## 2020-11-12 DIAGNOSIS — O039 Complete or unspecified spontaneous abortion without complication: Secondary | ICD-10-CM

## 2020-11-12 LAB — BETA HCG QUANT (REF LAB): hCG Quant: 1729 m[IU]/mL

## 2020-11-12 NOTE — Telephone Encounter (Addendum)
-----   Message from Bernerd Limbo, [place] sent at 11/12/2020 10:43 AM EST ----- Regarding: HCG F/U Can you please schedule this patient for another hcg in a week? Thank you! ----- Message ----- From: Interface, [facility] Lab Results In Sent: 11/12/2020   3:45 AM EST To: Bernerd Limbo, CNM   Called pt and notified her that we need to follow her levels to zero if she could come in on 11/18/20 for another pregnancy hormone level.  Pt states that she will be to come in at 1530 for a non stat beta.  Message sent to front office to schedule.    Addison Naegeli, RN  11/12/20

## 2020-11-18 ENCOUNTER — Other Ambulatory Visit: Payer: Self-pay

## 2020-11-18 ENCOUNTER — Other Ambulatory Visit: Payer: BC Managed Care – PPO

## 2020-11-18 DIAGNOSIS — O039 Complete or unspecified spontaneous abortion without complication: Secondary | ICD-10-CM

## 2020-11-19 LAB — BETA HCG QUANT (REF LAB): hCG Quant: 556 m[IU]/mL

## 2020-12-21 ENCOUNTER — Other Ambulatory Visit: Payer: BC Managed Care – PPO

## 2020-12-21 ENCOUNTER — Telehealth: Payer: Self-pay | Admitting: Lactation Services

## 2020-12-21 ENCOUNTER — Other Ambulatory Visit: Payer: Self-pay

## 2020-12-21 DIAGNOSIS — O034 Incomplete spontaneous abortion without complication: Secondary | ICD-10-CM

## 2020-12-21 NOTE — Telephone Encounter (Signed)
Patient called and LM on nurse voicemail that she is concerned she has not had a period since her recent miscarriage on 1/31.   Per chart reviewed patient last Hcg 556 on 2/24 and no further Hcg levels have been obtained.

## 2020-12-21 NOTE — Telephone Encounter (Signed)
Opened in error

## 2020-12-21 NOTE — Telephone Encounter (Signed)
Chart reviewed for nurse visit. Agree with plan of care.   Venora Maples, MD 12/21/2020 1:24 PM

## 2020-12-21 NOTE — Telephone Encounter (Signed)
Spoke with Dr. Crissie Reese. Reviewed chart and advised that patient needs to come in to have follow up non stat beta.   Patient reports she is having a lot of cramping off and on most every day. She is having unprotected sexual intercourse. Reviewed it is recommended she come in to have Beta Hcg redrawn today or tomorrow. Patient voiced understanding.   Patient to come in between 2:30-3 for follow up non stat Beta, will follow up based on findings.

## 2020-12-21 NOTE — Addendum Note (Signed)
Addended by: Ed Blalock on: 12/21/2020 01:12 PM   Modules accepted: Orders

## 2020-12-22 LAB — BETA HCG QUANT (REF LAB): hCG Quant: 37 m[IU]/mL

## 2020-12-23 ENCOUNTER — Telehealth: Payer: Self-pay | Admitting: Family Medicine

## 2020-12-23 DIAGNOSIS — Z8759 Personal history of other complications of pregnancy, childbirth and the puerperium: Secondary | ICD-10-CM

## 2020-12-23 NOTE — Telephone Encounter (Signed)
Called patient to let her know that her hcg has fallen from 556 to 37 on 12/21/20 since last check about a month ago. At the end of January she had a TVUS showing IUGS but no yolk sac or embryo.  Given that she has been having unprotected sex, unclear of the significance of this result. Discussed this could be a continued slow downtrend from her miscarriage or representative of a new pregnancy. She has continued to have unprotected sex as she hopes to get pregnant again soon. Recommend she present tomorrow for stat repeat beta though it will unfortunately be 72 hours from last draw. Reviewed MAU precautions.  FYI to clinical staff

## 2020-12-24 ENCOUNTER — Other Ambulatory Visit: Payer: BC Managed Care – PPO

## 2020-12-24 DIAGNOSIS — Z8759 Personal history of other complications of pregnancy, childbirth and the puerperium: Secondary | ICD-10-CM

## 2020-12-24 LAB — BETA HCG QUANT (REF LAB): hCG Quant: 38 m[IU]/mL

## 2020-12-25 NOTE — Progress Notes (Signed)
Abnormal hcg trend. Patient history notable for TVUS on 10/25/20 showing IUGS but no YS, hcg that day had fallen from 87k>72k and continued to fall over the next several weeks. Plan had been to trend to 0 but patient did not keep further appointments. Subsequently called clinic early this week to request hcg as she had not yet had a period since January. Beta 37 on 3/29 and now 38 on 4/1. Attempted to call patient to discuss this abnormal trend and direct her to go to the MAU tomorrow for a repeat beta, unable to reach patient and left a voicemail, also left a one way text via [employer]ximity and let her know I would contact her by mychart and try to call her again later. Unclear if this is ongoing lack of decline from pregnancy diagnosed in January, or if this is a new abnormal pregnancy. I have notified MAU team to put her in the beta book for tomorrow and made MAU providers on tomorrow aware of her.

## 2020-12-26 ENCOUNTER — Inpatient Hospital Stay (HOSPITAL_COMMUNITY): Payer: BC Managed Care – PPO

## 2020-12-26 ENCOUNTER — Inpatient Hospital Stay (HOSPITAL_COMMUNITY)
Admission: AD | Admit: 2020-12-26 | Discharge: 2020-12-26 | Disposition: A | Discharge status: Home / Self Care | Payer: BC Managed Care – PPO | Attending: Obstetrics and Gynecology | Admitting: Obstetrics and Gynecology

## 2020-12-26 ENCOUNTER — Other Ambulatory Visit: Payer: Self-pay

## 2020-12-26 DIAGNOSIS — Z3A00 Weeks of gestation of pregnancy not specified: Secondary | ICD-10-CM | POA: Insufficient documentation

## 2020-12-26 DIAGNOSIS — O09291 Supervision of pregnancy with other poor reproductive or obstetric history, first trimester: Secondary | ICD-10-CM | POA: Diagnosis not present

## 2020-12-26 DIAGNOSIS — O3680X0 Pregnancy with inconclusive fetal viability, not applicable or unspecified: Secondary | ICD-10-CM

## 2020-12-26 DIAGNOSIS — O26891 Other specified pregnancy related conditions, first trimester: Secondary | ICD-10-CM | POA: Diagnosis not present

## 2020-12-26 DIAGNOSIS — Z3A01 Less than 8 weeks gestation of pregnancy: Secondary | ICD-10-CM | POA: Insufficient documentation

## 2020-12-26 DIAGNOSIS — Z8759 Personal history of other complications of pregnancy, childbirth and the puerperium: Secondary | ICD-10-CM | POA: Diagnosis not present

## 2020-12-26 DIAGNOSIS — R109 Unspecified abdominal pain: Secondary | ICD-10-CM | POA: Diagnosis not present

## 2020-12-26 LAB — COMPREHENSIVE METABOLIC PANEL WITH GFR
ALT: 82 U/L — ABNORMAL HIGH (ref 0–44)
AST: 51 U/L — ABNORMAL HIGH (ref 15–41)
Albumin: 4.3 g/dL (ref 3.5–5.0)
Alkaline Phosphatase: 39 U/L (ref 38–126)
Anion gap: 6 (ref 5–15)
BUN: 10 mg/dL (ref 6–20)
CO2: 25 mmol/L (ref 22–32)
Calcium: 9.3 mg/dL (ref 8.9–10.3)
Chloride: 106 mmol/L (ref 98–111)
Creatinine, Ser: 0.63 mg/dL (ref 0.44–1.00)
GFR, Estimated: >60 mL/min (ref >60)
Glucose, Bld: 97 mg/dL (ref 70–99)
Potassium: 3.9 mmol/L (ref 3.5–5.1)
Sodium: 137 mmol/L (ref 135–145)
Total Bilirubin: 0.9 mg/dL (ref 0.3–1.2)
Total Protein: 7.8 g/dL (ref 6.5–8.1)

## 2020-12-26 LAB — CBC
HCT: 42.8 % (ref 36.0–46.0)
Hemoglobin: 14.6 g/dL (ref 12.0–15.0)
MCH: 31.3 pg (ref 26.0–34.0)
MCHC: 34.1 g/dL (ref 30.0–36.0)
MCV: 91.6 fL (ref 80.0–100.0)
Platelets: 199 K/uL (ref 150–400)
RBC: 4.67 MIL/uL (ref 3.87–5.11)
RDW: 11.7 % (ref 11.5–15.5)
WBC: 5.8 K/uL (ref 4.0–10.5)
nRBC: 0 % (ref 0.0–0.2)

## 2020-12-26 LAB — HCG, QUANTITATIVE, PREGNANCY: hCG, Beta Chain, Quant, S: 51 m[IU]/mL — ABNORMAL HIGH (ref <5)

## 2020-12-26 NOTE — Discharge Instructions (Signed)
Return to care  If you have heavier bleeding that soaks through more that 2 pads per hour for an hour or more If you bleed so much that you feel like you might pass out or you do pass out If you have significant abdominal pain that is not improved with Tylenol   

## 2020-12-26 NOTE — MAU Provider Note (Signed)
History   Chief Complaint:  Follow-up   Alexis Gordon is  25 y.o. [phone] No LMP recorded.. Patient is here for follow up of quantitative HCG and ongoing surveillance of pregnancy status. She is Unknown weeks gestation. She had a blighted ovum in January & stopped coming for [facility] the end of February when her HCG was 556. Since then she has been having unprotected intercourse.  Currently reports some mild intermittent abdominal cramping.   Since her last visit, the patient is without new complaint. The patient reports bleeding as  none now.  She denies any pain.  General ROS:  negative  Her previous Quantitative HCG values are: HCG 3/29=37 HCG 4/1= 38  Physical Exam   Blood pressure 124/89, pulse 64, temperature 98.1 F (36.7 C), temperature source Oral, resp. rate 16, SpO2 100 %.  Focused Gynecological Exam: examination not indicated  Physical Examination: General appearance - alert, well appearing, and in no distress Mental status - normal mood, behavior, speech, dress, motor activity, and thought processes Chest - Normal respirations, no difficulties noted   Labs: Results for orders placed or performed during the hospital encounter of 12/26/20 (from the past 24 hour(s))  CBC   Collection Time: 12/26/20 12:00 PM  Result Value Ref Range   WBC 5.8 4.0 - 10.5 K/uL   RBC 4.67 3.87 - 5.11 MIL/uL   Hemoglobin 14.6 12.0 - 15.0 g/dL   HCT 65.0 35.4 - 65.6 %   MCV 91.6 80.0 - 100.0 fL   MCH 31.3 26.0 - 34.0 pg   MCHC 34.1 30.0 - 36.0 g/dL   RDW 81.2 75.1 - 70.0 %   Platelets 199 150 - 400 K/uL   nRBC 0.0 0.0 - 0.2 %  Comprehensive metabolic panel   Collection Time: 12/26/20 12:00 PM  Result Value Ref Range   Sodium 137 135 - 145 mmol/L   Potassium 3.9 3.5 - 5.1 mmol/L   Chloride 106 98 - 111 mmol/L   CO2 25 22 - 32 mmol/L   Glucose, Bld 97 70 - 99 mg/dL   BUN 10 6 - 20 mg/dL   Creatinine, Ser 1.74 0.44 - 1.00 mg/dL   Calcium 9.3 8.9 - 94.4 mg/dL   Total Protein  7.8 6.5 - 8.1 g/dL   Albumin 4.3 3.5 - 5.0 g/dL   AST 51 (H) 15 - 41 U/L   ALT 82 (H) 0 - 44 U/L   Alkaline Phosphatase 39 38 - 126 U/L   Total Bilirubin 0.9 0.3 - 1.2 mg/dL   GFR, Estimated >96 >75 mL/min   Anion gap 6 5 - 15  hCG, quantitative, pregnancy   Collection Time: 12/26/20 12:00 PM  Result Value Ref Range   hCG, Beta Chain, Quant, S 51 (H) <5 mIU/mL    Ultrasound Studies:   US OB LESS THAN 14 WEEKS WITH OB TRANSVAGINAL  Result Date: 12/26/2020 CLINICAL DATA:  Abdominal pain. Miscarriage in January. Beta HCG equals 51 unknown menstrual period dates EXAM: OBSTETRIC <14 WK [place] AND TRANSVAGINAL OB US TECHNIQUE: Both transabdominal and transvaginal ultrasound examinations were performed for complete evaluation of the gestation as well as the maternal uterus, adnexal regions, and pelvic cul-de-sac. Transvaginal technique was performed to assess early pregnancy. COMPARISON:  Pelvic ultrasound 10/25/2020 FINDINGS: Intrauterine gestational sac: Single sac-like structure in the endometrial canal measures 4.2 mm. No decidual reaction. Yolk sac:  Not present Embryo:  not present MSD: 4.2 mm   5 w   1 d Subchorionic hemorrhage:  None visualized. Maternal uterus/adnexae: Normal RIGHT ovary with small follicles. Normal LEFT ovary with small follicles. No free fluid IMPRESSION: 1. Single sac-like structure within the endometrial canal without decidual reaction. No yolk sac, or fetal pole identified. Differential considerations include intrauterine pregnancy too early to be sonographically visualized, missed abortion, or ectopic pregnancy. Followup ultrasound is recommended in 10-14 days for further evaluation. 2. Normal ovaries. 3. No free fluid. Electronically Signed   By: Genevive Bi M.D.   On: 12/26/2020 13:56    Assessment:   1. Pregnancy of unknown anatomic location   2. Abdominal pain during pregnancy in first trimester    -HCG today is 51. Ultrasound shows empty fluid filled sac in  uterus measuring 4.2 mm. Pt is stable. Reviewed results with Dr. Alysia Penna. Will get ultrasound in 10 days for viability.    Plan: -Discharge home in stable condition -SAB vs ectopic precautions discussed -Patient advised to follow-up for outpatient viability ultrasound -Patient may return to MAU as needed or if her condition were to change or worsen  Judeth Horn, NP 12/26/2020, 2:46 PM

## 2020-12-26 NOTE — MAU Note (Signed)
Pt reports to mau for follow up lab work.  Denies vag bleeding. Reports continued cramping.

## 2020-12-27 NOTE — Telephone Encounter (Addendum)
Patient went to MAU 12/26/20 and had stat bhg= 51 and repeat US. Per plan needs another [place] 10 days. I called and scheduled for 01/05/21 at 0800.  I called patient and notified her of [place] appointment. We also discussed she c/o mild cramps but no bleeding. She denies severe pain. I advised if she has severe pain or bleeding to go to hospital mau for evaluation for possible ectopic. She voices understanding. Quintel Mccalla,RN

## 2021-01-03 ENCOUNTER — Other Ambulatory Visit: Payer: Self-pay

## 2021-01-03 ENCOUNTER — Inpatient Hospital Stay (HOSPITAL_COMMUNITY)
Admission: AD | Admit: 2021-01-03 | Discharge: 2021-01-03 | Disposition: A | Discharge status: Home / Self Care | Payer: BC Managed Care – PPO | Attending: Family Medicine | Admitting: Family Medicine

## 2021-01-03 ENCOUNTER — Encounter (HOSPITAL_COMMUNITY): Payer: Self-pay | Admitting: Family Medicine

## 2021-01-03 ENCOUNTER — Inpatient Hospital Stay (HOSPITAL_COMMUNITY): Payer: BC Managed Care – PPO

## 2021-01-03 DIAGNOSIS — Z3A00 Weeks of gestation of pregnancy not specified: Secondary | ICD-10-CM | POA: Insufficient documentation

## 2021-01-03 DIAGNOSIS — O26891 Other specified pregnancy related conditions, first trimester: Secondary | ICD-10-CM | POA: Insufficient documentation

## 2021-01-03 DIAGNOSIS — O039 Complete or unspecified spontaneous abortion without complication: Secondary | ICD-10-CM

## 2021-01-03 DIAGNOSIS — R109 Unspecified abdominal pain: Secondary | ICD-10-CM | POA: Insufficient documentation

## 2021-01-03 LAB — WET PREP, GENITAL
Clue Cells Wet Prep HPF POC: NONE SEEN
Sperm: NONE SEEN
Trich, Wet Prep: NONE SEEN
Yeast Wet Prep HPF POC: NONE SEEN

## 2021-01-03 LAB — URINALYSIS, ROUTINE W REFLEX MICROSCOPIC
Bilirubin Urine: NEGATIVE
Glucose, UA: NEGATIVE mg/dL
Hgb urine dipstick: NEGATIVE
Ketones, ur: NEGATIVE mg/dL
Leukocytes,Ua: NEGATIVE
Nitrite: NEGATIVE
Protein, ur: NEGATIVE mg/dL
Specific Gravity, Urine: 1.009 (ref 1.005–1.030)
pH: 7 (ref 5.0–8.0)

## 2021-01-03 LAB — HCG, QUANTITATIVE, PREGNANCY: hCG, Beta Chain, Quant, S: 41 m[IU]/mL — ABNORMAL HIGH (ref <5)

## 2021-01-03 LAB — COMPREHENSIVE METABOLIC PANEL
ALT: 67 U/L — ABNORMAL HIGH (ref 0–44)
AST: 34 U/L (ref 15–41)
Albumin: 4.1 g/dL (ref 3.5–5.0)
Alkaline Phosphatase: 41 U/L (ref 38–126)
Anion gap: 6 (ref 5–15)
BUN: 7 mg/dL (ref 6–20)
CO2: 27 mmol/L (ref 22–32)
Calcium: 9.5 mg/dL (ref 8.9–10.3)
Chloride: 102 mmol/L (ref 98–111)
Creatinine, Ser: 0.73 mg/dL (ref 0.44–1.00)
GFR, Estimated: >60 mL/min (ref >60)
Glucose, Bld: 110 mg/dL — ABNORMAL HIGH (ref 70–99)
Potassium: 3.8 mmol/L (ref 3.5–5.1)
Sodium: 135 mmol/L (ref 135–145)
Total Bilirubin: 0.6 mg/dL (ref 0.3–1.2)
Total Protein: 7.4 g/dL (ref 6.5–8.1)

## 2021-01-03 LAB — CBC
HCT: 39.9 % (ref 36.0–46.0)
Hemoglobin: 13.7 g/dL (ref 12.0–15.0)
MCH: 31.8 pg (ref 26.0–34.0)
MCHC: 34.3 g/dL (ref 30.0–36.0)
MCV: 92.6 fL (ref 80.0–100.0)
Platelets: 200 10*3/uL (ref 150–400)
RBC: 4.31 MIL/uL (ref 3.87–5.11)
RDW: 11.4 % — ABNORMAL LOW (ref 11.5–15.5)
WBC: 6.6 10*3/uL (ref 4.0–10.5)
nRBC: 0 % (ref 0.0–0.2)

## 2021-01-03 MED ORDER — OXYCODONE HCL 5 MG PO CAPS: 5.0000 mg | ORAL_CAPSULE | Freq: Four times a day (QID) | ORAL | 0 refills | Status: AC | PRN

## 2021-01-03 MED ORDER — MISOPROSTOL 200 MCG PO TABS: 800.0000 ug | ORAL_TABLET | Freq: Once | ORAL | 0 refills | Status: DC

## 2021-01-03 NOTE — Discharge Instructions (Signed)
FACTS YOU SHOULD KNOW  WHAT IS AN EARLY PREGNANCY FAILURE? Once the egg is fertilized with the sperm and begins to develop, it attaches to the lining of the uterus. This early pregnancy tissue may not develop into an embryo (the beginning stage of a baby). Sometimes an embryo does develop but does not continue to grow. These problems can be seen on ultrasound.   MANAGEMNT OF EARLY PREGNANCY FAILURE: About 4 out of 100 (0.25%) women will have a pregnancy loss in her lifetime.  One in five pregnancies is found to be an early pregnancy failure.  There are 3 ways to care for an early pregnancy failure:   (1) Surgery, (2) Medicine, (3) Waiting for you to pass the pregnancy on your own. The decision as to how to proceed after being diagnosed with and early pregnancy failure is an individual one.  The decision can be made only after appropriate counseling.  You need to weigh the pros and cons of the 3 choices. Then you can make the choice that works for you. SURGERY (D&E) . Procedure over in 1 day . Requires being put to sleep . Bleeding may be light . Possible problems during surgery, including injury to womb(uterus) . Care provider has more control Medicine (CYTOTEC) . The complete procedure may take days to weeks . No Surgery . Bleeding may be heavy at times . There may be drug side effects . Patient has more control Waiting . You may choose to wait, in which case your own body may complete the passing of the abnormal early pregnancy on its own in about 2-4 weeks . Your bleeding may be heavy at times . There is a small possibility that you may need surgery if the bleeding is too much or not all of the pregnancy has passed. CYTOTEC MANAGEMENT Prostaglandins (cytotec) are the most widely used drug for this purpose. They cause the uterus to cramp and contract. You will place the medicine yourself inside your vagina in the privacy of your home. Empting of the uterus should occur within 3 days but  the process may continue for several weeks. The bleeding may seem heavy at times. POSSIBLE SIDE EFFECTS FROM CYTOTEC . Nausea   Vomiting . Diarrhea Fever . Chills  Hot Flashes Side effects  from the process of the early pregnancy failure include: . Cramping  Bleeding . Headaches  Dizziness RISKS: This is a low risk procedure. Less than 1 in 100 women has a complication. An incomplete passage of the early pregnancy may occur. Also, Hemorrhage (heavy bleeding) could happen.  Rarely the pregnancy will not be passed completely. Excessively heavy bleeding may occur.  Your doctor may need to perform surgery to empty the uterus (D&E). Afterwards: Everybody will feel differently after the early pregnancy completion. You may have soreness or cramps for a day or two. You may have soreness or cramps for day or two.  You may have light bleeding for up to 2 weeks. You may be as active as you feel like being. If you have any of the following problems you may call [facility]missions [facility] at [phone]. . If you have pain that does not get better  with pain medication . Bleeding that soaks through 2 thick full-sized sanitary pads in an hour . Cramps that last longer than 2 days . Foul smelling discharge . Fever above 100.4 degrees F Even if you do not have any of these symptoms, you should have a follow-up exam to make sure you   are healing properly. This appointment will be made for you before you leave the hospital. Your next normal period will start again in 4-6 week after the loss. You can get pregnant soon after the loss, so use birth control right away. Finally: Make sure all your questions are answered before during and after any procedure. Follow up with medical care and family planning methods.      Anembryonic Pregnancy  An anembryonic pregnancy, also known as a blighted ovum, is a common kind of early pregnancy failure and a common cause of early pregnancy loss. It is often called an early  miscarriage. It happens when a fertilized egg attaches to the wall of the uterus but stops growing. Even though the egg never develops, the body acts as if it were pregnant. An early miscarriage can be difficult because you will have the physical symptoms of the loss. You may also have strong emotional symptoms that come with the loss of a pregnancy. What are the causes? This condition is usually caused by a problem with the genes (genetic defect) in the fertilized egg. What are the signs or symptoms? Early symptoms of this condition are the same as those of a normal early pregnancy. They include:  A missed menstrual period.  Tiredness (fatigue).  Nausea.  Sore breasts. Later symptoms are those of pregnancy loss. They include:  Cramps in your abdomen.  Vaginal bleeding or spotting.  A menstrual period that is heavier than usual. How is this diagnosed? This condition is usually diagnosed by a routine ultrasound. It can be confirmed with blood tests. How is this treated? This condition may be treated by:  Waiting until your body naturally gets rid of the empty egg sac and placenta (miscarriage).  Taking medicine to start a miscarriage. This medicine can be taken by mouth or placed into the vagina.  Having a surgical procedure to remove the tissue (dilation and curettage, D&C). The health care provider would open the lowest part of the uterus (cervix) and remove the tissue from the uterus. Follow these instructions at home:  Take over-the-counter and prescription medicines only as told by your health care provider.  Talk with your health care provider about future pregnancies and pregnancy planning. Having this condition does not mean you will lose future pregnancies.  Do not have sex, douche, or put anything, such as tampons, in your vagina until your health care provider says it is okay.  To help you and your partner with the grieving process, talk with your health care provider  or get counseling.  After your miscarriage: ? Rest at home for a few days. ? You may have menstrual-like bleeding for a week or more, and you may have light bleeding for a couple of weeks after that. Wear a pad until vaginal bleeding stops.  Return to your normal activities as soon as you feel well enough. Talk with your health care provider before resuming physical activities that require a lot of effort (are strenuous).  Keep all follow-up visits. This is important. Where to find more information  The [employer]necologists: acog.org  [place] Department of Health and Cytogeneticist of Women's Health: http://hoffman.com/ Contact a health care provider if:  You have a fever or chills.  Your pain medicine is not helping.  You have vaginal bleeding that goes on for longer than expected.  You continue to feel sad after the loss of your pregnancy. Get help right away if:  You have severe  pain in your abdomen.  You feel dizzy or faint.  You faint.  You have very heavy vaginal bleeding. Your vaginal bleeding is very heavy if blood soaks through 2 large sanitary pads an hour for more than 2 hours.  You feel sad, and your sadness takes over your thoughts.  You think about hurting yourself. If you ever feel like you may hurt yourself or others, or have thoughts about taking your own life, get help right away. Go to your nearest emergency department or:  Call your local emergency services (911 in the [place]  Call a suicide crisis helpline, such as the National Suicide Prevention Lifeline at (570)613-6413. This is open 24 hours a day in the [place]  Text the Crisis Text Line at (628)501-1576 (in the [place] Summary  An anembryonic pregnancy is a common kind of early pregnancy loss also known as a blighted ovum or a miscarriage.  Treatment may include waiting until your body naturally gets rid of the empty egg sac and placenta (miscarriage) or  taking medicine to start a miscarriage.  Talk with your health care provider about future pregnancies and pregnancy planning.  Return to your normal activities as soon as you feel well enough. This information is not intended to replace advice given to you by your health care provider. Make sure you discuss any questions you have with your health care provider. Document Revised: 03/12/2020 Document Reviewed: 03/12/2020 Elsevier Patient Education  2021 [employer].
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
## 2021-01-03 NOTE — MAU Note (Signed)
Since she woke up morning, she has been having a lot of cramping in her lower abd. Is nauseated and has been having chills at times. (did not check her temperature).  Is not having any bleeding.  Feels like she is going to get her period.

## 2021-01-04 LAB — GC/CHLAMYDIA PROBE AMP (~~LOC~~) NOT AT ARMC
Chlamydia: NEGATIVE
Comment: NEGATIVE
Comment: NORMAL
Neisseria Gonorrhea: NEGATIVE

## 2021-01-05 ENCOUNTER — Ambulatory Visit: Admission: RE | Admit: 2021-01-05 | Payer: BC Managed Care – PPO | Source: Ambulatory Visit

## 2021-01-10 ENCOUNTER — Other Ambulatory Visit: Payer: BC Managed Care – PPO

## 2021-01-10 ENCOUNTER — Other Ambulatory Visit: Payer: Self-pay

## 2021-01-10 ENCOUNTER — Other Ambulatory Visit: Payer: Self-pay | Admitting: *Deleted

## 2021-01-10 DIAGNOSIS — O039 Complete or unspecified spontaneous abortion without complication: Secondary | ICD-10-CM

## 2021-01-11 LAB — BETA HCG QUANT (REF LAB): hCG Quant: 32 m[IU]/mL

## 2021-01-17 ENCOUNTER — Telehealth: Payer: Self-pay | Admitting: General Practice

## 2021-01-17 NOTE — Telephone Encounter (Signed)
Patient called and left message on nurse voicemail line stating she is having a lot of cramping and nausea and would like a nurse to call her back as she has questions. Called patient, no answer- left message to call us back. Per chart review, patient has had recent abnormal bhcg levels.

## 2021-01-18 NOTE — Telephone Encounter (Signed)
Returned patients call. Patient reports she is feeling better today. The pain is a cramping period type pain and has been spotting, it is mostly with wiping. She reports the pain is better today but continues to return as moderate cramping. She has been taking 500 mgTylenol every 6 hours. The pain decreases but comes back. reviewed she can take 1000 mg every 6 hours as needed.   Patient reports she did not come for her last [place] as she did not need it as she was in the MAU 2 days prior. She has had Cytotec x 2, last on 4/11. She noted a small amount of spotting for 2 hours after Cytotec. She did not see any tissue but continued spotting.   She has a follow up in the office in 2 days. Patient is concerned and would like some resolution to the miscarriage.   Reviewed with Dr. Crissie Reese who advised patient should keep her upcoming appt and return to MAU for severe pain, heavy bleeding or fever. Reviewed that pain can be normal for miscarriage and/or uterus inverting. Patient voiced understanding to recommendations. She was advised it is very important she she follow up in the office this week for her appointment. Reviewed appointment date and time.

## 2021-01-20 ENCOUNTER — Encounter: Payer: Self-pay | Admitting: Obstetrics and Gynecology

## 2021-01-20 ENCOUNTER — Ambulatory Visit (INDEPENDENT_AMBULATORY_CARE_PROVIDER_SITE_OTHER): Payer: BC Managed Care – PPO | Admitting: Obstetrics and Gynecology

## 2021-01-20 DIAGNOSIS — O039 Complete or unspecified spontaneous abortion without complication: Secondary | ICD-10-CM

## 2021-01-20 NOTE — Progress Notes (Signed)
Ms Shawnie Dapper presents for f/u from first trimester SAB. She reports some spotting This is her second first trimester SAB this year. However BHCG never reached < 5 form last SAB.  PE AF VSS Lungs clear Heart RRR Abd soft + BS  A/P SAB  Discussed importance of following BHCG til < 5. Advised not to attempt pregnancy for 3-6 months. Continue with MVI or PNV qd. Reassured pt that I suspect that this most recent SAB was related to conceiving to closed from last SAB. Do not feel additional work for miscarriage is warranted at this time. Discussed contraception with pt. She is considering Depo Provera. Will order for pt once today's BHCG returns. F/U PRN

## 2021-01-20 NOTE — Patient Instructions (Signed)
Health Maintenance, Female Adopting a healthy lifestyle and getting preventive care are important in promoting health and wellness. Ask your health care provider about:  The right schedule for you to have regular tests and exams.  Things you can do on your own to prevent diseases and keep yourself healthy. What should I know about diet, weight, and exercise? Eat a healthy diet  Eat a diet that includes plenty of vegetables, fruits, low-fat dairy products, and lean protein.  Do not eat a lot of foods that are high in solid fats, added sugars, or sodium.   Maintain a healthy weight Body mass index (BMI) is used to identify weight problems. It estimates body fat based on height and weight. Your health care provider can help determine your BMI and help you achieve or maintain a healthy weight. Get regular exercise Get regular exercise. This is one of the most important things you can do for your health. Most adults should:  Exercise for at least 150 minutes each week. The exercise should increase your heart rate and make you sweat (moderate-intensity exercise).  Do strengthening exercises at least twice a week. This is in addition to the moderate-intensity exercise.  Spend less time sitting. Even light physical activity can be beneficial. Watch cholesterol and blood lipids Have your blood tested for lipids and cholesterol at 26 years of age, then have this test every 5 years. Have your cholesterol levels checked more often if:  Your lipid or cholesterol levels are high.  You are older than 26 years of age.  You are at high risk for heart disease. What should I know about cancer screening? Depending on your health history and family history, you may need to have cancer screening at various ages. This may include screening for:  Breast cancer.  Cervical cancer.  Colorectal cancer.  Skin cancer.  Lung cancer. What should I know about heart disease, diabetes, and high blood  pressure? Blood pressure and heart disease  High blood pressure causes heart disease and increases the risk of stroke. This is more likely to develop in people who have high blood pressure readings, are of African descent, or are overweight.  Have your blood pressure checked: ? Every 3-5 years if you are 18-39 years of age. ? Every year if you are 40 years old or older. Diabetes Have regular diabetes screenings. This checks your fasting blood sugar level. Have the screening done:  Once every three years after age 40 if you are at a normal weight and have a low risk for diabetes.  More often and at a younger age if you are overweight or have a high risk for diabetes. What should I know about preventing infection? Hepatitis B If you have a higher risk for hepatitis B, you should be screened for this virus. Talk with your health care provider to find out if you are at risk for hepatitis B infection. Hepatitis C Testing is recommended for:  Everyone born from 1945 through 1965.  Anyone with known risk factors for hepatitis C. Sexually transmitted infections (STIs)  Get screened for STIs, including gonorrhea and chlamydia, if: ? You are sexually active and are younger than 26 years of age. ? You are older than 26 years of age and your health care provider tells you that you are at risk for this type of infection. ? Your sexual activity has changed since you were last screened, and you are at increased risk for chlamydia or gonorrhea. Ask your health care provider   if you are at risk.  Ask your health care provider about whether you are at high risk for HIV. Your health care provider may recommend a prescription medicine to help prevent HIV infection. If you choose to take medicine to prevent HIV, you should first get tested for HIV. You should then be tested every 3 months for as long as you are taking the medicine. Pregnancy  If you are about to stop having your period (premenopausal) and  you may become pregnant, seek counseling before you get pregnant.  Take 400 to 800 micrograms (mcg) of folic acid every day if you become pregnant.  Ask for birth control (contraception) if you want to prevent pregnancy. Osteoporosis and menopause Osteoporosis is a disease in which the bones lose minerals and strength with aging. This can result in bone fractures. If you are 65 years old or older, or if you are at risk for osteoporosis and fractures, ask your health care provider if you should:  Be screened for bone loss.  Take a calcium or vitamin D supplement to lower your risk of fractures.  Be given hormone replacement therapy (HRT) to treat symptoms of menopause. Follow these instructions at home: Lifestyle  Do not use any products that contain nicotine or tobacco, such as cigarettes, e-cigarettes, and chewing tobacco. If you need help quitting, ask your health care provider.  Do not use street drugs.  Do not share needles.  Ask your health care provider for help if you need support or information about quitting drugs. Alcohol use  Do not drink alcohol if: ? Your health care provider tells you not to drink. ? You are pregnant, may be pregnant, or are planning to become pregnant.  If you drink alcohol: ? Limit how much you use to 0-1 drink a day. ? Limit intake if you are breastfeeding.  Be aware of how much alcohol is in your drink. In the [place] one drink equals one 12 oz bottle of beer (355 mL), one 5 oz glass of wine (148 mL), or one 1 oz glass of hard liquor (44 mL). General instructions  Schedule regular health, dental, and eye exams.  Stay current with your vaccines.  Tell your health care provider if: ? You often feel depressed. ? You have ever been abused or do not feel safe at home. Summary  Adopting a healthy lifestyle and getting preventive care are important in promoting health and wellness.  Follow your health care provider's instructions about healthy  diet, exercising, and getting tested or screened for diseases.  Follow your health care provider's instructions on monitoring your cholesterol and blood pressure. This information is not intended to replace advice given to you by your health care provider. Make sure you discuss any questions you have with your health care provider. Document Revised: 09/04/2018 Document Reviewed: 09/04/2018 Elsevier Patient Education  2021 [employer]  

## 2021-01-21 LAB — BETA HCG QUANT (REF LAB): hCG Quant: 28 m[IU]/mL

## 2021-02-03 ENCOUNTER — Other Ambulatory Visit: Payer: BC Managed Care – PPO

## 2021-02-03 ENCOUNTER — Other Ambulatory Visit: Payer: Self-pay

## 2021-02-03 ENCOUNTER — Other Ambulatory Visit: Payer: Self-pay | Admitting: *Deleted

## 2021-02-03 DIAGNOSIS — O039 Complete or unspecified spontaneous abortion without complication: Secondary | ICD-10-CM

## 2021-02-04 ENCOUNTER — Other Ambulatory Visit: Payer: BC Managed Care – PPO

## 2021-02-04 LAB — BETA HCG QUANT (REF LAB): hCG Quant: 23 m[IU]/mL

## 2021-02-07 ENCOUNTER — Telehealth: Payer: Self-pay | Admitting: *Deleted

## 2021-02-07 NOTE — Telephone Encounter (Addendum)
-----   Message from Hermina Staggers, MD sent at 02/04/2021 12:05 PM EDT ----- Please schedule pt for non stat BHCG in 2 weeks. Pt is aware. Thanks Casimiro Needle   5/16  0857  Called pt and she did not answer. Message left on voicemail stating that I am calling with test results. I requested that she please call us back.

## 2021-02-08 NOTE — Telephone Encounter (Signed)
I called Alexis Gordon and informed her of last non stat bhcg result and need for another in 2 weeks. She already has appt for non stat bhcg 02/22/21 and is aware of the appointment. She voices understanding. [name],RN

## 2021-02-22 ENCOUNTER — Ambulatory Visit: Payer: Self-pay | Admitting: *Deleted

## 2021-02-22 ENCOUNTER — Other Ambulatory Visit: Payer: BC Managed Care – PPO

## 2021-02-22 ENCOUNTER — Other Ambulatory Visit: Payer: Self-pay

## 2021-02-22 DIAGNOSIS — O039 Complete or unspecified spontaneous abortion without complication: Secondary | ICD-10-CM

## 2021-02-22 NOTE — Progress Notes (Signed)
Pt requesting bHCG lab draw on site vs going to [facility] office for lab draw. Serially monitoring q2weeks until <5.

## 2021-02-23 ENCOUNTER — Other Ambulatory Visit: Payer: BC Managed Care – PPO

## 2021-02-23 LAB — BETA HCG QUANT (REF LAB): hCG Quant: 20 m[IU]/mL

## 2021-02-28 ENCOUNTER — Telehealth: Payer: Self-pay

## 2021-02-28 DIAGNOSIS — O039 Complete or unspecified spontaneous abortion without complication: Secondary | ICD-10-CM

## 2021-02-28 NOTE — Telephone Encounter (Addendum)
-----   Message from Hermina Staggers, MD sent at 02/25/2021  2:24 PM EDT ----- Please schedule pt for a repeat non stat BHCG in 4 weeks Pt is aware.   Thanks [employer] pt with results and provider recommendation. Pt states she is able to have a lab appt with [facility] in her place of work. Would not like lab appt with our office. I explained that she should have this drawn in 4 weeks. Future order placed.

## 2021-03-24 ENCOUNTER — Other Ambulatory Visit: Payer: Self-pay

## 2021-03-24 ENCOUNTER — Ambulatory Visit: Payer: Self-pay | Admitting: *Deleted

## 2021-03-24 DIAGNOSIS — O039 Complete or unspecified spontaneous abortion without complication: Secondary | ICD-10-CM

## 2021-03-24 NOTE — Progress Notes (Signed)
bHCG serial per OB orders

## 2021-03-25 ENCOUNTER — Telehealth: Payer: Self-pay | Admitting: *Deleted

## 2021-03-25 LAB — BETA HCG QUANT (REF LAB): hCG Quant: 12 m[IU]/mL

## 2021-03-25 NOTE — Telephone Encounter (Signed)
I called Leasha and gave her results and recommendations per Dr. Vergie Living. I explained registar will schedule follow up for either next week or following week due to next week being holiday week and they will contact her with an appointment. She voices understanding. Alanta Scobey,RN

## 2021-03-25 NOTE — Telephone Encounter (Signed)
-----   Message from [place] Bing, MD sent at 03/25/2021  9:53 AM EDT ----- Please call her and let her know that her beta hcg is still not to non pregnancy level and add her on to see an GYN MD for an in person visit next week. thanks

## 2021-04-07 ENCOUNTER — Ambulatory Visit (INDEPENDENT_AMBULATORY_CARE_PROVIDER_SITE_OTHER): Payer: BC Managed Care – PPO | Admitting: Obstetrics and Gynecology

## 2021-04-07 ENCOUNTER — Encounter: Payer: Self-pay | Admitting: Obstetrics and Gynecology

## 2021-04-07 ENCOUNTER — Other Ambulatory Visit: Payer: Self-pay

## 2021-04-07 ENCOUNTER — Other Ambulatory Visit (HOSPITAL_COMMUNITY)
Admission: RE | Admit: 2021-04-07 | Discharge: 2021-04-07 | Disposition: A | Discharge status: Home / Self Care | Payer: BC Managed Care – PPO | Source: Ambulatory Visit | Attending: Obstetrics and Gynecology | Admitting: Obstetrics and Gynecology

## 2021-04-07 VITALS — BP 130/89 | HR 84 | Ht 62.0 in | Wt 143.0 lb

## 2021-04-07 DIAGNOSIS — N898 Other specified noninflammatory disorders of vagina: Secondary | ICD-10-CM | POA: Diagnosis not present

## 2021-04-07 DIAGNOSIS — Z1159 Encounter for screening for other viral diseases: Secondary | ICD-10-CM | POA: Diagnosis not present

## 2021-04-07 DIAGNOSIS — E349 Endocrine disorder, unspecified: Secondary | ICD-10-CM | POA: Diagnosis not present

## 2021-04-07 DIAGNOSIS — R7989 Other specified abnormal findings of blood chemistry: Secondary | ICD-10-CM | POA: Insufficient documentation

## 2021-04-07 DIAGNOSIS — Z124 Encounter for screening for malignant neoplasm of cervix: Secondary | ICD-10-CM | POA: Insufficient documentation

## 2021-04-07 NOTE — Progress Notes (Signed)
Obstetrics and Gynecology Visit Return Patient Evaluation  Appointment Date: 04/07/2021  OBGYN Clinic: [facility]   Chief Complaint: persistent low level beta hcg  History of Present Illness:  Alexis Gordon is a 25 y.o. [phone] with above CC.   Patient was diagnosed on 1/31 with a blighted ovum and prescribed cytotec by this clinic; no YS or fetal pole seen on u/s. She was seen for routine follow up on 2/10 and a beta hcg ordered which was 3754 from 72,780 on 1/31; she was subsequently managed with serial HCGs with it c/b having unprotected intercourse during the follow up. She had an u/s on 4/13 when her hcg was 51. U/s showed a "single sac like structure in the endometrial canal" that measured 4.64mm and plan for repeat u/s in 10 days. She presented with abdominal pain on 4/11 and hcg was 41 and her showed an intrauterine sac of 3.59mm (u/s report was 52mm but images show 0.36cm. she was given cytotec and plan to f/u in 1wk for an hcg and 2wks for in person follow up. 4/18 hcg was 32 and her hcg on 4/28 was 28. At that time, the provider felt this was a new pregnancy vs the one in late January and plan for serial hcg until <5. Her 5/12 hcg was 23, 5/31 was 20 and 6/30 was 12. Given the slow drop in hcg I recommended she come in for another hcg and in office discussion   Interval History: Since that time, she states that she has no GYN compalints; she is still amenorrheic.   Review of Systems: as noted in the History of Present Illness.   Medications:  Alexis Gordon. [name] had no medications administered during this visit. Current Outpatient Medications  Medication Sig Dispense Refill   Prenatal Vit-Fe Fumarate-FA (PRENATAL MULTIVITAMIN) TABS tablet Take 1 tablet by mouth daily at 12 noon. (Patient not taking: Reported on 04/07/2021)     No current facility-administered medications for this visit.    Allergies: has No Known Allergies.  Physical  Exam:  BP 130/89   Pulse 84   Ht 5\' 2"  (1.575 m)   Wt 143 lb (64.9 kg)   LMP 08/03/2020 (LMP Unknown)   Breastfeeding No   BMI 26.16 kg/m  Body mass index is 26.16 kg/m. General appearance: Well nourished, well developed female in no acute distress.  Abdomen: diffusely non tender to palpation, non distended, and no masses, hernias Neuro/Psych:  Normal mood and affect.    Pelvic exam:  EGBUS, vaginal vault and cervix: within normal limits Uterus: small, nttp, mobile   Assessment: pt stable  Plan:  1. Elevated serum hCG Rpt today. I told her if it's still elevated that she may need a d&c, but plan of care will depend on value today - Beta hCG quant (ref lab)  2. Cervical cancer screening - Cytology - PAP( [place])  3. Vaginal odor - Cervicovaginal ancillary only( [place])  4. Need for hepatitis C screening test Pt desired screening - Hepatitis C Antibody   RTC: PRN  13/05/2020 MD Attending Center for [facility])

## 2021-04-08 ENCOUNTER — Telehealth: Payer: Self-pay | Admitting: Obstetrics and Gynecology

## 2021-04-08 DIAGNOSIS — E349 Endocrine disorder, unspecified: Secondary | ICD-10-CM

## 2021-04-08 LAB — CERVICOVAGINAL ANCILLARY ONLY
Bacterial Vaginitis (gardnerella): NEGATIVE
Candida Glabrata: NEGATIVE
Candida Vaginitis: POSITIVE — AB
Chlamydia: NEGATIVE
Comment: NEGATIVE
Comment: NEGATIVE
Comment: NEGATIVE
Comment: NEGATIVE
Comment: NEGATIVE
Comment: NORMAL
Neisseria Gonorrhea: NEGATIVE
Trichomonas: NEGATIVE

## 2021-04-08 LAB — BETA HCG QUANT (REF LAB): hCG Quant: 13 m[IU]/mL

## 2021-04-08 LAB — HEPATITIS C ANTIBODY: Hep C Virus Ab: <0.1 s/co ratio (ref 0.0–0.9)

## 2021-04-08 MED ORDER — NORGESTIMATE-ETH ESTRADIOL 0.25-35 MG-MCG PO TABS: 1.0000 | ORAL_TABLET | Freq: Every day | ORAL | 3 refills | Status: DC

## 2021-04-08 NOTE — Telephone Encounter (Signed)
GYN Telephone Note Patient called and d/w her re: beta hcg value of 13; prior one was 12. I told her that based on my research that most likely causes are potential quiescient GTN vs pituitary source, and I recommend OCPS and recheck with UPT in 3 weeks and in the interim to obtain an pelvic u/s. If pituitiary in etiology, then the OCPs should suppress it. Pt amenable to plan. I told her to consider the OCPs effective for birth control in 1 week  Request sent to clinic to for pelvic u/s and lab only appt  Cornelia Copa MD Attending Center for [facility] (Faculty Practice) 04/08/2021 Time: [phone]

## 2021-04-10 MED ORDER — FLUCONAZOLE 150 MG PO TABS: 150.0000 mg | ORAL_TABLET | Freq: Once | ORAL | 0 refills | Status: AC

## 2021-04-10 NOTE — Addendum Note (Signed)
Addended by: [place] Bing on: 04/10/2021 10:10 AM   Modules accepted: Orders

## 2021-04-11 ENCOUNTER — Telehealth: Payer: Self-pay

## 2021-04-11 NOTE — Telephone Encounter (Signed)
-----   Message from [place] Bing, MD sent at 04/08/2021  7:43 AM EDT ----- She needs a lab only appointment for urine and blood work in 3 weeks (future order is in) and she needs a pelvic u/s appt (order is in). Pt states that any day or any time of day is fine. Thanks

## 2021-04-11 NOTE — Telephone Encounter (Signed)
Call placed to pt. Pt given recommendations per Dr Vergie Living. Pt agreeable to plan of care.  Pt states did have some light vaginal bleeding that was "possible" her cycle. Only had for 1.5 days and was light. Advised to keep track of vaginal bleeding and if becomes heavy or severe abd pain to be seen at ER. Pt verbalized understanding.   Pt scheduled for PUS on 7/21 at 10:30am at [facility].  And lab only appt for UPT and non-stat Beta for 8/3 @ 840am.   Pt agreeable to date and time of appts. Pt verbalized understanding.   Judeth Cornfield, RN

## 2021-04-14 ENCOUNTER — Other Ambulatory Visit: Payer: Self-pay

## 2021-04-14 ENCOUNTER — Ambulatory Visit (HOSPITAL_COMMUNITY)
Admission: RE | Admit: 2021-04-14 | Discharge: 2021-04-14 | Disposition: A | Discharge status: Home / Self Care | Payer: BC Managed Care – PPO | Source: Ambulatory Visit | Attending: Obstetrics and Gynecology | Admitting: Obstetrics and Gynecology

## 2021-04-14 DIAGNOSIS — E349 Endocrine disorder, unspecified: Secondary | ICD-10-CM

## 2021-04-14 LAB — CYTOLOGY - PAP
Chlamydia: NEGATIVE
Comment: NEGATIVE
Comment: NEGATIVE
Comment: NEGATIVE
Comment: NORMAL
Diagnosis: UNDETERMINED — AB
High risk HPV: NEGATIVE
Neisseria Gonorrhea: NEGATIVE
Trichomonas: NEGATIVE

## 2021-04-20 ENCOUNTER — Telehealth: Payer: Self-pay | Admitting: Obstetrics and Gynecology

## 2021-04-20 NOTE — Telephone Encounter (Signed)
GYN Telephone Note  Patient called and u/s results reviewed with her. I told her I looked at the images too and it looks like it is retained POCs and that I recommend hysteroscopy, d&c.  She states she had 3 days of bleeding (somewhat heavy on the 1st day) after the u/s.  I told her I recommend scheduling the surgery and then scheduling a repeat u/s and bloodwork a few days before the surgery. Patient amenable to plan  Cornelia Copa MD Attending Center for [facility] (Faculty Practice) 04/20/2021 Time: [phone]

## 2021-04-22 ENCOUNTER — Encounter: Payer: Self-pay | Admitting: *Deleted

## 2021-04-22 ENCOUNTER — Telehealth: Payer: Self-pay

## 2021-04-22 DIAGNOSIS — O034 Incomplete spontaneous abortion without complication: Secondary | ICD-10-CM

## 2021-04-22 NOTE — Telephone Encounter (Signed)
Received new order request from Dr. Vergie Living that pt will need an U/S scheduled early or middle of next week to evaluate for POC.  U/S scheduled for 04/27/21 @ 0800 right before her lab in the [facility] office.  Pt notifed of appt.

## 2021-04-22 NOTE — Telephone Encounter (Signed)
Call to patient. Surgery date options reviewed and agreeable to proceed on 05-03-21 at 1000 at [facility]- arrive 0800. Advised will receive pre-op call from facility with additional instructions. Advised to keep current lab as scheduled on 8-3 and Dr Vergie Living plans to schedule ultrasound for later next week.

## 2021-04-26 ENCOUNTER — Ambulatory Visit (INDEPENDENT_AMBULATORY_CARE_PROVIDER_SITE_OTHER): Payer: BC Managed Care – PPO | Admitting: Podiatry

## 2021-04-26 ENCOUNTER — Other Ambulatory Visit: Payer: Self-pay

## 2021-04-26 DIAGNOSIS — M2041 Other hammer toe(s) (acquired), right foot: Secondary | ICD-10-CM | POA: Diagnosis not present

## 2021-04-26 DIAGNOSIS — M2042 Other hammer toe(s) (acquired), left foot: Secondary | ICD-10-CM | POA: Diagnosis not present

## 2021-04-26 NOTE — Patient Instructions (Signed)
Look on Amazon for a urea cream or gel containing 40% or 50% urea. Something such as:     

## 2021-04-26 NOTE — Progress Notes (Signed)
  Subjective:  Patient ID: Doy Hutching, female    DOB: 03/14/1996,  MRN: [id number]  Chief Complaint  Patient presents with   Callouses    Bilateral corns 1-5 x 20+ years. Pt states not pain just does not like the appearance of toes.    25 y.o. female presents with the above complaint. History confirmed with patient.   Objective:  Physical Exam: warm, good capillary refill, no trophic changes or ulcerative lesions, normal DP and PT pulses, and normal sensory exam. Left Foot: flexible hammertoes with HPKs to the dorsal aspect of all DIPJs and PIPJs of the lesser digits.  Right Foot: flexible hammertoes with HPKs to the dorsal aspect of all DIPJs and PIPJs of the lesser digits.    Assessment:   1. Hammer toes of both feet    Plan:  Patient was evaluated and treated and all questions answered.  Hammertoes with Corns -No pain, more issues with appearance. Would not recommend surgical intervention at that time. -Recc urea cream to decrease hyperkeratosis. -F/u if issues persist  Return if symptoms worsen or fail to improve, for Hammertoe f/u.

## 2021-04-27 ENCOUNTER — Other Ambulatory Visit: Payer: BC Managed Care – PPO

## 2021-04-27 ENCOUNTER — Ambulatory Visit
Admission: RE | Admit: 2021-04-27 | Discharge: 2021-04-27 | Disposition: A | Discharge status: Home / Self Care | Payer: BC Managed Care – PPO | Source: Ambulatory Visit | Attending: Obstetrics and Gynecology | Admitting: Obstetrics and Gynecology

## 2021-04-27 ENCOUNTER — Ambulatory Visit (INDEPENDENT_AMBULATORY_CARE_PROVIDER_SITE_OTHER): Payer: BC Managed Care – PPO | Admitting: Obstetrics and Gynecology

## 2021-04-27 ENCOUNTER — Encounter: Payer: Self-pay | Admitting: Obstetrics and Gynecology

## 2021-04-27 VITALS — BP 120/82 | HR 69 | Wt 141.9 lb

## 2021-04-27 DIAGNOSIS — E349 Endocrine disorder, unspecified: Secondary | ICD-10-CM

## 2021-04-27 DIAGNOSIS — O034 Incomplete spontaneous abortion without complication: Secondary | ICD-10-CM | POA: Insufficient documentation

## 2021-04-27 NOTE — Progress Notes (Signed)
Pt her for U/S results. See prior notes and plan from Dr. Vergie Living. U/S still suggestive of retained POC Reviewed with pt and partner Recommend proceeding with D & C as scheduled next week with Dr. Vergie Living. BHCG drawn today pending  PE AF VSS Lungs clear Heart RRR Abd soft + BS  A/P Probable retained POC's  To keep surgery appt next week with Dr. Vergie Living. F/U post op as per Dr. Vergie Living

## 2021-04-28 ENCOUNTER — Encounter: Payer: Self-pay | Admitting: Obstetrics and Gynecology

## 2021-04-28 LAB — BETA HCG QUANT (REF LAB): hCG Quant: 4 m[IU]/mL

## 2021-04-29 ENCOUNTER — Encounter (HOSPITAL_COMMUNITY): Payer: Self-pay | Admitting: Obstetrics and Gynecology

## 2021-04-29 ENCOUNTER — Other Ambulatory Visit: Payer: Self-pay

## 2021-04-29 NOTE — Progress Notes (Signed)
PCP - denies Cardiologist - denies EKG -  Chest x-ray -  ECHO -  Cardiac Cath -  CPAP -   COVID TEST-   Anesthesia review: n/a  -------------  SDW INSTRUCTIONS:  Your procedure is scheduled on 8/9 Tuesday. Please report to [facility] Main Entrance "A" at 0740 A.M., and check in at the Admitting office. Call this number if you have problems the morning of surgery: [phone]   Remember: Do not eat or drink after midnight the night before your surgery   Medications to take morning of surgery with a sip of water include: none  As of today, STOP taking any Aspirin (unless otherwise instructed by your surgeon), Aleve, Naproxen, Ibuprofen, Motrin, Advil, Goody's, BC's, all herbal medications, fish oil, and all vitamins.    The Morning of Surgery Do not wear jewelry, make-up or nail polish. Do not wear lotions, powders, or perfumes, or deodorant Do not shave 48 hours prior to surgery.   Do not bring valuables to the hospital. [facility] is not responsible for any belongings or valuables.  If you are a smoker, DO NOT Smoke 24 hours prior to surgery  If you wear a CPAP at night please bring your mask the morning of surgery   Remember that you must have someone to transport you home after your surgery, and remain with you for 24 hours if you are discharged the same day.  Please bring cases for contacts, glasses, hearing aids, dentures or bridgework because it cannot be worn into surgery.   Patients discharged the day of surgery will not be allowed to drive home.   Please shower the NIGHT BEFORE/MORNING OF SURGERY (use antibacterial soap like DIAL soap if possible). Wear comfortable clothes the morning of surgery. Oral Hygiene is also important to reduce your risk of infection.  Remember - BRUSH YOUR TEETH THE MORNING OF SURGERY WITH YOUR REGULAR TOOTHPASTE  Patient denies shortness of breath, fever, cough and chest pain.

## 2021-05-03 ENCOUNTER — Ambulatory Visit (HOSPITAL_COMMUNITY): Payer: BC Managed Care – PPO | Admitting: Anesthesiology

## 2021-05-03 ENCOUNTER — Encounter (HOSPITAL_COMMUNITY)
Admission: RE | Disposition: A | Discharge status: Home / Self Care | Payer: Self-pay | Source: Home / Self Care | Attending: Obstetrics and Gynecology

## 2021-05-03 ENCOUNTER — Encounter (HOSPITAL_COMMUNITY): Payer: Self-pay | Admitting: Obstetrics and Gynecology

## 2021-05-03 ENCOUNTER — Ambulatory Visit (HOSPITAL_COMMUNITY)
Admission: RE | Admit: 2021-05-03 | Discharge: 2021-05-03 | Disposition: A | Discharge status: Home / Self Care | Payer: BC Managed Care – PPO | Attending: Obstetrics and Gynecology | Admitting: Obstetrics and Gynecology

## 2021-05-03 ENCOUNTER — Other Ambulatory Visit: Payer: Self-pay | Admitting: Obstetrics and Gynecology

## 2021-05-03 ENCOUNTER — Other Ambulatory Visit: Payer: Self-pay

## 2021-05-03 DIAGNOSIS — O034 Incomplete spontaneous abortion without complication: Secondary | ICD-10-CM | POA: Diagnosis present

## 2021-05-03 DIAGNOSIS — N9489 Other specified conditions associated with female genital organs and menstrual cycle: Secondary | ICD-10-CM | POA: Diagnosis not present

## 2021-05-03 HISTORY — PX: HYSTEROSCOPY WITH D & C: SHX1775

## 2021-05-03 LAB — CBC
HCT: 42.2 % (ref 36.0–46.0)
Hemoglobin: 14.2 g/dL (ref 12.0–15.0)
MCH: 31.9 pg (ref 26.0–34.0)
MCHC: 33.6 g/dL (ref 30.0–36.0)
MCV: 94.8 fL (ref 80.0–100.0)
Platelets: 176 10*3/uL (ref 150–400)
RBC: 4.45 MIL/uL (ref 3.87–5.11)
RDW: 11.9 % (ref 11.5–15.5)
WBC: 7.2 10*3/uL (ref 4.0–10.5)
nRBC: 0 % (ref 0.0–0.2)

## 2021-05-03 SURGERY — DILATATION AND CURETTAGE /HYSTEROSCOPY
Anesthesia: General | Site: Vagina

## 2021-05-03 MED ORDER — PROPOFOL 10 MG/ML IV BOLUS
INTRAVENOUS | Status: AC
  Filled 2021-05-03: qty 40

## 2021-05-03 MED ORDER — HYDROMORPHONE HCL 1 MG/ML IJ SOLN
INTRAMUSCULAR | Status: AC
  Filled 2021-05-03: qty 1

## 2021-05-03 MED ORDER — ROCURONIUM BROMIDE 10 MG/ML (PF) SYRINGE
PREFILLED_SYRINGE | INTRAVENOUS | Status: AC
  Filled 2021-05-03: qty 10

## 2021-05-03 MED ORDER — PROPOFOL 10 MG/ML IV BOLUS
INTRAVENOUS | Status: DC | PRN
  Administered 2021-05-03: 150 mg via INTRAVENOUS

## 2021-05-03 MED ORDER — KETOROLAC TROMETHAMINE 30 MG/ML IJ SOLN
INTRAMUSCULAR | Status: DC | PRN
  Administered 2021-05-03: 30 mg via INTRAVENOUS

## 2021-05-03 MED ORDER — KETOROLAC TROMETHAMINE 30 MG/ML IJ SOLN
INTRAMUSCULAR | Status: AC
  Filled 2021-05-03: qty 1

## 2021-05-03 MED ORDER — FENTANYL CITRATE (PF) 250 MCG/5ML IJ SOLN
INTRAMUSCULAR | Status: AC
  Filled 2021-05-03: qty 5

## 2021-05-03 MED ORDER — DEXAMETHASONE SODIUM PHOSPHATE 10 MG/ML IJ SOLN
INTRAMUSCULAR | Status: AC
  Filled 2021-05-03: qty 1

## 2021-05-03 MED ORDER — DEXMEDETOMIDINE HCL IN NACL 200 MCG/50ML IV SOLN
INTRAVENOUS | Status: AC
  Filled 2021-05-03: qty 50

## 2021-05-03 MED ORDER — AMISULPRIDE (ANTIEMETIC) 5 MG/2ML IV SOLN: 10.0000 mg | Freq: Once | INTRAVENOUS | Status: DC | PRN

## 2021-05-03 MED ORDER — CHLORHEXIDINE GLUCONATE 0.12 % MT SOLN
15.0000 mL | Freq: Once | OROMUCOSAL | Status: AC
  Administered 2021-05-03: 15 mL via OROMUCOSAL
  Filled 2021-05-03: qty 15

## 2021-05-03 MED ORDER — ONDANSETRON HCL 4 MG/2ML IJ SOLN
INTRAMUSCULAR | Status: DC | PRN
  Administered 2021-05-03: 4 mg via INTRAVENOUS

## 2021-05-03 MED ORDER — HYDROMORPHONE HCL 1 MG/ML IJ SOLN
0.2500 mg | INTRAMUSCULAR | Status: DC | PRN
  Administered 2021-05-03: 0.5 mg via INTRAVENOUS

## 2021-05-03 MED ORDER — ONDANSETRON HCL 4 MG/2ML IJ SOLN
INTRAMUSCULAR | Status: AC
  Filled 2021-05-03: qty 2

## 2021-05-03 MED ORDER — LACTATED RINGERS IV SOLN: INTRAVENOUS | Status: DC

## 2021-05-03 MED ORDER — LIDOCAINE 2% (20 MG/ML) 5 ML SYRINGE
INTRAMUSCULAR | Status: AC
  Filled 2021-05-03: qty 5

## 2021-05-03 MED ORDER — LIDOCAINE HCL 1 % IJ SOLN
INTRAMUSCULAR | Status: AC
  Filled 2021-05-03: qty 20

## 2021-05-03 MED ORDER — SODIUM CHLORIDE 0.9 % IV SOLN
100.0000 mg | Freq: Once | INTRAVENOUS | Status: AC
  Administered 2021-05-03: 100 mg via INTRAVENOUS
  Filled 2021-05-03: qty 100

## 2021-05-03 MED ORDER — DEXAMETHASONE SODIUM PHOSPHATE 10 MG/ML IJ SOLN
INTRAMUSCULAR | Status: DC | PRN
  Administered 2021-05-03: 5 mg via INTRAVENOUS

## 2021-05-03 MED ORDER — MIDAZOLAM HCL 5 MG/5ML IJ SOLN
INTRAMUSCULAR | Status: DC | PRN
  Administered 2021-05-03: 2 mg via INTRAVENOUS

## 2021-05-03 MED ORDER — OXYCODONE HCL 5 MG/5ML PO SOLN: 5.0000 mg | Freq: Once | ORAL | Status: DC | PRN

## 2021-05-03 MED ORDER — ORAL CARE MOUTH RINSE: 15.0000 mL | Freq: Once | OROMUCOSAL | Status: AC

## 2021-05-03 MED ORDER — FENTANYL CITRATE (PF) 100 MCG/2ML IJ SOLN
INTRAMUSCULAR | Status: DC | PRN
  Administered 2021-05-03: 100 ug via INTRAVENOUS

## 2021-05-03 MED ORDER — SODIUM CHLORIDE 0.9 % IR SOLN
Status: DC | PRN
  Administered 2021-05-03: 3000 mL

## 2021-05-03 MED ORDER — OXYCODONE HCL 5 MG PO TABS: 5.0000 mg | ORAL_TABLET | Freq: Once | ORAL | Status: DC | PRN

## 2021-05-03 MED ORDER — LIDOCAINE HCL (CARDIAC) PF 100 MG/5ML IV SOSY
PREFILLED_SYRINGE | INTRAVENOUS | Status: DC | PRN
  Administered 2021-05-03: 100 mg via INTRAVENOUS

## 2021-05-03 MED ORDER — DEXMEDETOMIDINE HCL IN NACL 200 MCG/50ML IV SOLN
INTRAVENOUS | Status: DC | PRN
  Administered 2021-05-03: 20 ug via INTRAVENOUS

## 2021-05-03 MED ORDER — MIDAZOLAM HCL 2 MG/2ML IJ SOLN
INTRAMUSCULAR | Status: AC
  Filled 2021-05-03: qty 2

## 2021-05-03 MED ORDER — MEPERIDINE HCL 25 MG/ML IJ SOLN: 6.2500 mg | INTRAMUSCULAR | Status: DC | PRN

## 2021-05-03 MED ORDER — PROMETHAZINE HCL 25 MG/ML IJ SOLN: 6.2500 mg | INTRAMUSCULAR | Status: DC | PRN

## 2021-05-03 MED ORDER — LIDOCAINE HCL 1 % IJ SOLN
INTRAMUSCULAR | Status: DC | PRN
  Administered 2021-05-03: 20 mL

## 2021-05-03 SURGICAL SUPPLY — 12 items
DEVICE MYOSURE REACH (MISCELLANEOUS) ×3 IMPLANT
FILTER UTR ASPR ASSEMBLY (MISCELLANEOUS) ×3 IMPLANT
GAUZE 4X4 16PLY ~~LOC~~+RFID DBL (SPONGE) ×3 IMPLANT
GLOVE SURG NEOPR MICRO LF SZ7 (GLOVE) ×3 IMPLANT
GLOVE SURG UNDER POLY LF SZ7.5 (GLOVE) ×3 IMPLANT
GOWN STRL REUS W/ TWL XL LVL3 (GOWN DISPOSABLE) ×2 IMPLANT
GOWN STRL REUS W/TWL XL LVL3 (GOWN DISPOSABLE) ×1
KIT PROCEDURE FLUENT (KITS) ×3 IMPLANT
NS IRRIG 1000ML POUR BTL (IV SOLUTION) ×3 IMPLANT
PACK VAGINAL MINOR WOMEN LF (CUSTOM PROCEDURE TRAY) ×3 IMPLANT
PAD OB MATERNITY 4.3X12.25 (PERSONAL CARE ITEMS) ×3 IMPLANT
TOWEL GREEN STERILE FF (TOWEL DISPOSABLE) ×6 IMPLANT

## 2021-05-03 NOTE — Op Note (Signed)
Operative Note   05/03/2021  PRE-OP DIAGNOSIS: 2cm intrauterine mass. Possible retained products of conception   POST-OP DIAGNOSIS: Same   SURGEON: Surgeon(s) and Role:    * [place] Bing, MD - Primary  ASSISTANT: None  PROCEDURE: Hysteroscopy, myosure polypectomy, dilation and curettage   ANESTHESIA: LMA and paracervical block   ESTIMATED BLOOD LOSS: 42mL  DRAINS: none   TOTAL IV FLUIDS: per OR report  SPECIMENS: intrauterine mass  VTE PROPHYLAXIS: SCDs to the bilateral lower extremities  ANTIBIOTICS: Doxycycline 100mg  IV pre operatively  FLUID DEFICIT:  COMPLICATIONS: none  DISPOSITION: PACU - hemodynamically stable.  CONDITION: stable  FINDINGS: Exam under anesthesia revealed small, mobile mild plane uterus with no masses and bilateral adnexa without masses or fullness; normal EGBUS, vagina and cervix. Patient sounded to 10cm.   Hysteroscopy revealed a 2cm slightly tan colored mass (avascular) at the mid portion of the uterine cavity; cavity was otherwise atrophic. Normal bilateral tubal ostia and normal appearing endocervical canal.  PROCEDURE IN DETAIL:  After informed consent was obtained, the patient was taken to the operating room where anesthesia was obtained without difficulty. The patient was positioned in the dorsal lithotomy position in [place] stirrups.  The patient was examined under anesthesia, with the above noted findings.  The bi-valved speculum was placed inside the patient's vagina, and the the anterior lip of the cervix was seen and grasped with the tenaculum.  A paracervical block was achieved with 52mL of 1% lidocaine.  The uterine cavity was sounded to 10cm, and then the cervix was already dilated to a 19French-Pratt dilator.  The hysteroscope was introduced, with the above noted findings and the Myosure was then done The hystersocope was removed and the uterine cavity was curetted until a gritty texture was noted, yielding scant endometrial  curettings.  Excellent hemostasis was noted, and all instruments were removed, with excellent hemostasis noted throughout.  She was then taken out of dorsal lithotomy. The patient tolerated the procedure well.  Sponge, lap and instrument counts were correct x2.  The patient was taken to recovery room in excellent condition.  30m MD Attending Center for Cornelia Copa [employer])

## 2021-05-03 NOTE — Anesthesia Procedure Notes (Signed)
Procedure Name: LMA Insertion Date/Time: 05/03/2021 11:04 AM Performed by: Shanon Payor, CRNA Pre-anesthesia Checklist: Patient identified, Patient being monitored, Emergency Drugs available, Timeout performed and Suction available Patient Re-evaluated:Patient Re-evaluated prior to induction Oxygen Delivery Method: Circle System Utilized Preoxygenation: Pre-oxygenation with 100% oxygen Induction Type: IV induction Ventilation: Mask ventilation without difficulty LMA: LMA inserted LMA Size: 3.0 Number of attempts: 1 Placement Confirmation: positive ETCO2 and breath sounds checked- equal and bilateral Tube secured with: Tape Dental Injury: Teeth and Oropharynx as per pre-operative assessment

## 2021-05-03 NOTE — Transfer of Care (Signed)
Immediate Anesthesia Transfer of Care Note  Patient: Alexis Gordon  Procedure(s) Performed: DILATATION AND CURETTAGE /HYSTEROSCOPY  MYOSURE/POLYPECTOMY (Vagina )  Patient Location: PACU  Anesthesia Type:General  Level of Consciousness: awake and drowsy  Airway & Oxygen Therapy: Patient Spontanous Breathing  Post-op Assessment: Report given to RN, Post -op Vital signs reviewed and stable and Patient moving all extremities X 4  Post vital signs: Reviewed and stable  Last Vitals:  Vitals Value Taken Time  BP    Temp    Pulse 79 05/03/21 1140  Resp 19 05/03/21 1140  SpO2 99 % 05/03/21 1140  Vitals shown include unvalidated device data.  Last Pain:  Vitals:   05/03/21 0836  TempSrc:   PainSc: 0-No pain         Complications: No notable events documented.

## 2021-05-03 NOTE — Anesthesia Postprocedure Evaluation (Signed)
Anesthesia Post Note  Patient: Alexis Gordon  Procedure(s) Performed: DILATATION AND CURETTAGE /HYSTEROSCOPY  MYOSURE/POLYPECTOMY (Vagina )     Patient location during evaluation: PACU Anesthesia Type: General Level of consciousness: awake and alert Pain management: pain level controlled Vital Signs Assessment: post-procedure vital signs reviewed and stable Respiratory status: spontaneous breathing, nonlabored ventilation and respiratory function stable Cardiovascular status: blood pressure returned to baseline and stable Postop Assessment: no apparent nausea or vomiting Anesthetic complications: no   No notable events documented.  Last Vitals:  Vitals:   05/03/21 1240 05/03/21 1255  BP: 98/71 105/72  Pulse: 72 80  Resp: 18 (!) 22  Temp:  36.6 C  SpO2: 99% 100%    Last Pain:  Vitals:   05/03/21 1255  TempSrc:   PainSc: 0-No pain                 Lowella Curb
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
## 2021-05-03 NOTE — Discharge Instructions (Signed)
[facility] for [facility] at [facility]       [phone] - [phone]

## 2021-05-03 NOTE — Anesthesia Preprocedure Evaluation (Signed)
Anesthesia Evaluation  Patient identified by MRN, date of birth, ID band Patient awake    Reviewed: Allergy & Precautions, NPO status , Patient's Chart, lab work & pertinent test results  Airway Mallampati: II  TM Distance: >3 FB Neck ROM: Full    Dental no notable dental hx.    Pulmonary neg pulmonary ROS,    Pulmonary exam normal breath sounds clear to auscultation       Cardiovascular negative cardio ROS Normal cardiovascular exam Rhythm:Regular Rate:Normal     Neuro/Psych Depression negative neurological ROS  negative psych ROS   GI/Hepatic negative GI ROS, Neg liver ROS,   Endo/Other  negative endocrine ROS  Renal/GU negative Renal ROS  negative genitourinary   Musculoskeletal negative musculoskeletal ROS (+)   Abdominal   Peds negative pediatric ROS (+)  Hematology negative hematology ROS (+)   Anesthesia Other Findings   Reproductive/Obstetrics negative OB ROS                             Anesthesia Physical Anesthesia Plan  ASA: 2  Anesthesia Plan: General   Post-op Pain Management:    Induction: Intravenous  PONV Risk Score and Plan: 3 and Ondansetron, Dexamethasone, Midazolam and Treatment may vary due to age or medical condition  Airway Management Planned: LMA  Additional Equipment:   Intra-op Plan:   Post-operative Plan: Extubation in OR  Informed Consent: I have reviewed the patients History and Physical, chart, labs and discussed the procedure including the risks, benefits and alternatives for the proposed anesthesia with the patient or authorized representative who has indicated his/her understanding and acceptance.     Dental advisory given  Plan Discussed with: CRNA  Anesthesia Plan Comments:         Anesthesia Quick Evaluation

## 2021-05-04 ENCOUNTER — Encounter (HOSPITAL_COMMUNITY): Payer: Self-pay | Admitting: Obstetrics and Gynecology

## 2021-05-04 LAB — SURGICAL PATHOLOGY

## 2021-06-09 ENCOUNTER — Encounter: Payer: Self-pay | Admitting: Registered Nurse

## 2021-06-09 ENCOUNTER — Other Ambulatory Visit: Payer: Self-pay

## 2021-06-09 ENCOUNTER — Ambulatory Visit: Payer: BC Managed Care – PPO | Admitting: Registered Nurse

## 2021-06-09 VITALS — BP 113/94 | HR 76 | Temp 98.9°F | Resp 16

## 2021-06-09 DIAGNOSIS — M7651 Patellar tendinitis, right knee: Secondary | ICD-10-CM

## 2021-06-09 MED ORDER — IBUPROFEN 800 MG PO TABS: 800.0000 mg | ORAL_TABLET | Freq: Three times a day (TID) | ORAL | 0 refills | Status: AC | PRN

## 2021-06-09 NOTE — Progress Notes (Signed)
Subjective:    Patient ID: Alexis Gordon, female    DOB: 08/11/1996, 25 y.o.   MRN: [id number]  25y/o hispanic female established patient here for evaluation right knee pain with bending.  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  Denied new exercise program or house/yard work.  Now wearing new pair of shoes last pair greater than a  year old.  Denied falls/known injury.  Sometimes feels swollen back of her knee/hurting deep inside and on the sides.  Doesn't have ice pack at home.  Has tried tylenol without relief.  Pain worst after work or if prolonged sitting and needs to stand up.  Denied weakness/giving out.     Review of Systems  Constitutional:  Negative for activity change, appetite change, chills, diaphoresis, fatigue and fever.  HENT:  Negative for trouble swallowing and voice change.   Eyes:  Negative for photophobia and visual disturbance.  Respiratory:  Negative for cough, shortness of breath, wheezing and stridor.   Cardiovascular:  Negative for palpitations.  Gastrointestinal:  Negative for diarrhea, nausea and vomiting.  Endocrine: Negative for cold intolerance and heat intolerance.  Genitourinary:  Negative for difficulty urinating.  Musculoskeletal:  Positive for arthralgias, gait problem, joint swelling and myalgias. Negative for back pain, neck pain and neck stiffness.  Skin:  Negative for color change and rash.  Allergic/Immunologic: Negative for environmental allergies and food allergies.  Neurological:  Negative for tremors, syncope, weakness and numbness.  Hematological:  Negative for adenopathy. Does not bruise/bleed easily.  Psychiatric/Behavioral:  Negative for agitation, confusion and sleep disturbance.       Objective:   Physical Exam Vitals and nursing note reviewed.  Constitutional:      General: She is awake. She is not in acute distress.     Appearance: Normal appearance. She is well-developed, well-groomed and normal weight. She is not ill-appearing, toxic-appearing or diaphoretic.  HENT:     Head: Normocephalic and atraumatic.     Jaw: There is normal jaw occlusion.     Salivary Glands: Right salivary gland is not diffusely enlarged. Left salivary gland is not diffusely enlarged.     Right Ear: Hearing and external ear normal.     Left Ear: Hearing and external ear normal.     Nose: Nose normal. No congestion or rhinorrhea.     Mouth/Throat:     Lips: Pink. No lesions.     Mouth: Mucous membranes are moist.     Pharynx: Oropharynx is clear.  Eyes:     General: Lids are normal. Vision grossly intact. Gaze aligned appropriately. No allergic shiner or scleral icterus.       Right eye: No discharge.        Left eye: No discharge.     Extraocular Movements: Extraocular movements intact.     Conjunctiva/sclera: Conjunctivae normal.     Pupils: Pupils are equal, round, and reactive to light.  Neck:     Trachea: Trachea and phonation normal. No tracheal deviation.  Cardiovascular:     Rate and Rhythm: Normal rate and regular rhythm.     Pulses:          Radial pulses are 2+ on the right side and 2+ on the left side.  Pulmonary:     Effort: Pulmonary effort is normal. No respiratory distress.     Breath sounds: Normal breath sounds and air entry. No stridor or transmitted upper airway  sounds. No wheezing.     Comments: Spoke full sentences without difficulty; no cough observed in exam room Abdominal:     General: Abdomen is flat.  Musculoskeletal:        General: Tenderness present. No deformity or signs of injury.     Right shoulder: No swelling, deformity, effusion or laceration. Normal range of motion.     Left shoulder: No swelling, deformity, effusion or laceration. Normal range of motion.     Right elbow: No swelling, deformity, effusion or lacerations. Normal range of motion.     Left elbow: No swelling, deformity,  effusion or lacerations. Normal range of motion.     Right hand: No swelling, deformity or lacerations. Normal range of motion. Normal strength.     Left hand: No swelling, deformity or lacerations. Normal range of motion. Normal strength.     Cervical back: Normal range of motion and neck supple. No swelling, edema, deformity, erythema, signs of trauma, lacerations or rigidity. Normal range of motion.     Thoracic back: No swelling, edema, deformity, signs of trauma or lacerations. Normal range of motion.     Lumbar back: No swelling, edema, deformity, signs of trauma or lacerations. Normal range of motion.     Right hip: No deformity, lacerations or crepitus. Normal range of motion. Normal strength.     Left hip: No deformity, lacerations or crepitus. Normal range of motion. Normal strength.     Right upper leg: No swelling, edema, deformity, lacerations or tenderness.     Left upper leg: No swelling, edema, deformity, lacerations or tenderness.     Right knee: Crepitus present. No swelling, deformity, effusion, erythema, ecchymosis, lacerations or bony tenderness. Decreased range of motion. Tenderness present over the medial joint line, lateral joint line and patellar tendon. No MCL, LCL, ACL or PCL tenderness. No LCL laxity, MCL laxity, ACL laxity or PCL laxity. Normal alignment, normal meniscus and normal patellar mobility. Normal pulse.     Instability Tests: Anterior drawer test negative. Posterior drawer test negative. Anterior Lachman test negative. Medial McMurray test negative and lateral McMurray test negative.     Left knee: Crepitus present. No swelling, deformity, effusion, erythema, ecchymosis, lacerations or bony tenderness. Normal range of motion. No tenderness. No medial joint line, lateral joint line, MCL, LCL, ACL, PCL or patellar tendon tenderness. No LCL laxity, MCL laxity, ACL laxity or PCL laxity.Normal alignment, normal meniscus and normal patellar mobility. Normal pulse.      Instability Tests: Anterior drawer test negative. Posterior drawer test negative. Anterior Lachman test negative. Medial McMurray test negative and lateral McMurray test negative.     Right lower leg: No lacerations, tenderness or bony tenderness. No edema.     Left lower leg: No lacerations, tenderness or bony tenderness. No edema.     Right ankle: No swelling, deformity, ecchymosis or lacerations. Normal range of motion.     Left ankle: No swelling, deformity, ecchymosis or lacerations. Normal range of motion.       Legs:     Comments: Bilateral crepitus with AROM flexion to extension right greater than left; mild patellar apprehension with direct pressure right only; negative bilateral lachmanns/mcmurrays/valgus/varus stress and anterior/posterior drawer tests; crepitus palpable only typically rare audible; decrease AROM flexion right compared to left and having pain with flexion greater than 90 degrees right; posterior fossa soft tissue not TTP today on exam; no effusion/no edema bursa; patellar tendons medial/lateral/superior and distal mildly TTP  Lymphadenopathy:     Head:  Right side of head: No submandibular or preauricular adenopathy.     Left side of head: No submandibular or preauricular adenopathy.     Cervical: No cervical adenopathy.     Right cervical: No superficial cervical adenopathy.    Left cervical: No superficial cervical adenopathy.  Skin:    General: Skin is warm and dry.     Capillary Refill: Capillary refill takes less than 2 seconds.     Coloration: Skin is not ashen, cyanotic, jaundiced, mottled, pale or sallow.     Findings: No abrasion, abscess, acne, bruising, burn, ecchymosis, erythema, signs of injury, laceration, lesion, petechiae, rash or wound.     Nails: There is no clubbing.  Neurological:     General: No focal deficit present.     Mental Status: She is alert and oriented to person, place, and time. Mental status is at baseline.     GCS: GCS eye  subscore is 4. GCS verbal subscore is 5. GCS motor subscore is 6.     Cranial Nerves: Cranial nerves are intact. No cranial nerve deficit, dysarthria or facial asymmetry.     Sensory: Sensation is intact. No sensory deficit.     Motor: Motor function is intact. No weakness, tremor, atrophy, abnormal muscle tone or seizure activity.     Coordination: Coordination is intact. Coordination normal.     Gait: Gait is intact. Gait normal.     Comments: In/out of chair and on/off exam table without difficulty; gait sure and steady in clinic; bilateral hand grasp equal 5/5  Psychiatric:        Attention and Perception: Attention and perception normal.        Mood and Affect: Mood and affect normal.        Speech: Speech normal.        Behavior: Behavior normal. Behavior is cooperative.        Thought Content: Thought content normal.        Cognition and Memory: Cognition and memory normal.        Judgment: Judgment normal.     Fitted and distributed small neoprene knee sleeve from clinic stock to patient today.     Assessment & Plan:  A-bilateral patellar tendonitis  P-Patient was instructed to decrease walking/kneeling/squatting/ladders/stairs this week and avoid running/jumping/squats and lunges until symptoms improved. Ibuprofen 800mg  po TID prn pain take at least daily with food x 2 weeks #30 RF0 dispensed from [employer] to patient   Medications as directed. Patient may take NSAIDS as needed avoid naproxen, advil, aleve, motrin or naprosyn OTC when taking ibuprofen. Call or return to clinic as needed if these symptoms worsen or fail to improve as anticipated.  Follow up re-evaluation in 2 weeks or sooner prn worsening.  If patient requires work restrictions will need appt with orthopedics or [facility].  Patient to see RN to schedule.  [facility] handouts patellar tendonitis and rehab exercises printed and given to patient.  Demonstrated stretches/exercises to patient.  Discussed hold  squats and stair drop exercises this week and start with holding 5 seconds this week; increase to 10 seconds next week and 15 seconds third week.  Consider sitting on ground instead of kneeling when assisting clients x 2 weeks.   Gentle AROM and wearing knee neoprene sleeve while active.  Do not need to wear knee support while sleeping.  May hand wash knee support.  Do not put in washer or dryer.  May use blow dryer to speed drying.  Cryotherapy 15 minutes QID prn pain/swelling. Patient given reusable hot/cold pack with cover from clinic stock.   Patient will need to follow up with [facility] if new work restrictions required as I am unable to write or release patient from restrictions in this clinic due to contract limitations. Patient verbalized agreement and understanding of treatment plan and had no further questions at this time.   P2: ROM exercises, Stretching, and Hand outs given

## 2021-06-09 NOTE — Patient Instructions (Signed)
Patellar Tendinitis Rehab Ask your health care provider which exercises are safe for you. Do exercises exactly as told by your health care provider and adjust them as directed. It is normal to feel mild stretching, pulling, tightness, or discomfort as you do these exercises. Stop right away if you feel sudden pain or your pain gets worse. Do not begin these exercises until told by your health care provider. Stretching and range-of-motion exercise This exercise warms up your muscles and joints and improves the movement and flexibility of your knee. The exercise also helps to relieve pain and stiffness. Hamstring, doorway stretch This is an exercise in which you lie in a doorway and prop your leg on a wall to stretch the back of your knee and thigh (hamstring). Lie on your back in front of a doorway with your left / right leg resting against the wall and your other leg flat on the floor in the doorway. There should be a slight bend in your left / right knee. Straighten your left / right knee. You should feel a stretch behind your knee or thigh. If you do not, scoot your buttocks closer to the door. Hold this position for ____5-15______ seconds. Repeat _____3_____ times. Complete this exercise ____2______ times a day. Strengthening exercises These exercises build strength and endurance in your knee. Endurance is the ability to use your muscles for a long time, even after they get tired. Quadriceps, isometric This exercise stretches the muscles in front of your thigh (quadriceps) without moving your knee joint (isometric). Lie on your back with your left / right leg extended and your other knee bent. Slowly tense the muscles in the front of your left / right thigh. When you do this, you should see your kneecap slide up toward your hip or see increased dimpling just above the knee. This motion will push the back of your knee toward the floor. If this is painful, try putting a rolled-up hand towel under  your knee to support it in a bent position. Change the size of the towel to find a position that allows you to do this exercise without any pain. For ____5-15______ seconds, hold the muscle as tight as you can without increasing your pain. Relax the muscles slowly and completely. Repeat _____3_____ times. Complete this exercise ___2_______ times a day. Straight leg raises This exercise stretches the muscles in front of your thigh (quadriceps) and the muscles that move your hips (hip flexors). Lie on your back with your left / right leg extended and your other knee bent. Tense the muscles in the front of your left / right thigh. When you do this, you should see your kneecap slide up or see increased dimpling just above the knee. Keep these muscles tight as you raise your leg 4-6 inches (10-15 cm) off the floor. Do not let your moving knee bend. Hold this position for __5-15________ seconds. Keep these muscles tense as you slowly lower your leg. Relax your muscles slowly and completely. Repeat _____3_____ times. Complete this exercise _____2_____ times a day. Squats This is a weight-bearing exercise in which you bend your knees and lower your hips while engaging your thigh muscles. Stand in front of a table, with your feet and knees pointing straight ahead. You may rest your hands on the table for balance but not for support. Slowly bend your knees and lower your hips like you are going to sit in a chair. Keep your weight over your heels, not over your toes. Keep   your lower legs upright so they are parallel with the table legs. Do not let your hips go lower than your knees. Do not bend lower than told by your health care provider. If your knee pain increases, do not bend as low. Hold the squat position for ___5-15_______ seconds. Slowly push with your legs to return to standing. Do not use your hands to pull yourself to standing. Repeat _____3_____ times. Complete this exercise _____2_____  times a day. Step-downs This is an exercise in which you step down slowly while engaging your leg muscles. Stand on the edge of a step. Keeping your weight over your _____right_____ heel, slowly bend your ___right_______ knee to bring your ____right______ heel toward the floor. Lower your heel as far as you can while keeping control and without increasing any discomfort. Do not let your ____right______ knee come forward. Use your leg muscles, not gravity, to lower your body. Hold a wall or rail for balance if needed. Slowly push through your heel to lift your body weight back up. Return to the starting position. Repeat ____3______ times. Complete this exercise ____2______ times a day. Straight leg raises This exercise strengthens the muscles that rotate the leg at the hip and move it away from your body (hip abductors). Lie on your side with your left / right leg in the top position. Lie so your head, shoulder, knee, and hip line up. You may bend your lower knee to help you keep your balance. Roll your hips slightly forward, so that your hips are stacked directly over each other and your left / right knee is facing forward. Leading with your heel, lift your top leg 4-6 inches (10-15 cm). You should feel the muscles in your outer hip lifting. Do not let your foot drift forward. Do not let your knee roll toward the ceiling. Hold this position for _____5-15_____ seconds. Slowly lower your leg to the starting position. Let your muscles relax completely after each repetition. Repeat ____3______ times. Complete this exercise _____2_____ times a day. This information is not intended to replace advice given to you by your health care provider. Make sure you discuss any questions you have with your health care provider. Document Revised: 01/02/2019 Document Reviewed: 07/02/2018 Elsevier Patient Education  2022 [employer] Patellar Tendinitis Patellar tendinitis is also called jumper's knee or  patellar tendinopathy. This condition happens when there is damage to and inflammation of the patellar tendon. Tendons are cord-like tissues that connect muscles to bones. The patellar tendon connects the bottom of the kneecap (patella) to the top of the shin bone (tibia). Patellar tendinitis causes pain in the front of the knee. The condition happens in the following stages: Stage 1: In this stage, you have pain only after activity. Stage 2: In this stage, you have pain during and after activity. Stage 3: In this stage, you have pain at rest as well as during and after activity. The pain limits your ability to do the activity. Stage 4: In this stage, the tendon tears and severely limits your activity. What are the causes? This condition is caused by repeated (repetitive) stress on the tendon. This stress may cause the tendon to stretch, swell, thicken, or tear. What increases the risk? The following factors may make you more likely to develop this condition: Participating in sports that involve running, kicking, and jumping, especially on hard surfaces. These include: Basketball. Volleyball. Soccer. Track and field. Training too hard. Having tight thigh muscles. Having received steroid injections in the tendon.   Having had knee surgery. Being 16-40 years old. Having rheumatoid arthritis, diabetes, or kidney disease. These conditions interrupt blood flow to the knee, causing the tendon to weaken. What are the signs or symptoms? The main symptom of this condition is pain and swelling in the front of the knee. The pain usually starts slowly and gradually gets worse. It may become painful to straighten your leg. The pain may get worse when you walk, run, or jump. How is this diagnosed? This condition may be diagnosed based on: Your symptoms. Your medical history. A physical exam. During the physical exam, your health care provider may check for: Tenderness in your patella. Tightness in your  thigh muscles. Pain when you straighten your knee. Imaging tests, including: X-rays. These will show the position and condition of your patella. An MRI. This will show any abnormality of the tendon. Ultrasound. This will show any swelling or other abnormalities of the tendon. How is this treated? Treatment for this condition depends on the stage of the condition. It may involve: Avoiding activities that cause pain, such as jumping. Icing and elevating your knee. Having sound wave stimulation to promote healing. Doing stretching and strengthening exercises (physical therapy) when pain and swelling improve. Wearing a knee brace. This may be needed if your condition does not improve with treatment. Using crutches or a walker. This may be needed if your condition does not improve with treatment. Surgery. This may be done if you have stage 4 tendinitis. Follow these instructions at home: If you have a brace: Wear the brace as told by your health care provider. Remove it only as told by your health care provider. Loosen the brace if your toes tingle, become numb, or turn cold and blue. Keep the brace clean. If the brace is not waterproof: Do not let it get wet. Cover it with a watertight covering when you take a bath or shower. Ask your health care provider when it is safe for you to drive. Managing pain, stiffness, and swelling  If directed, put ice on the injured area. If you have a removable brace, remove it as told by your health care provider. Put ice in a plastic bag. Place a towel between your skin and the bag. Leave the ice on for 20 minutes, 2-3 times a day. Move your toes often to reduce stiffness and swelling. Raise (elevate) your knee above the level of your heart while you are sitting or lying down. Activity Do not use the injured limb to support your body weight until your health care provider says that you can. Use your crutches or a walker as told by your health care  provider. Return to your normal activities as told by your health care provider. Ask your health care provider what activities are safe for you. Do exercises as told by your health care provider or physical therapist. General instructions Take over-the-counter and prescription medicines only as told by your health care provider. Do not use any products that contain nicotine or tobacco, such as cigarettes, e-cigarettes, and chewing tobacco. These can delay healing. If you need help quitting, ask your health care provider. Keep all follow-up visits as told by your health care provider. This is important. How is this prevented? Warm up and stretch before being active. Cool down and stretch after being active. Give your body time to rest between periods of activity. You may need to reduce how often you play a sport that requires frequent jumping. Make sure to use equipment that   fits you. Be safe and responsible while being active. This will help you avoid falls which can damage the tendon. Do at least 150 minutes of moderate-intensity exercise each week, such as brisk walking or water aerobics. Maintain physical fitness, including: Strength. Flexibility. Cardiovascular fitness. Endurance. Contact a health care provider if: Your symptoms have not improved in 6 weeks. Your symptoms get worse. Summary Patellar tendinitis is also called jumper's knee or patellar tendinopathy. This condition happens when there is damage to and inflammation of the patellar tendon. Treatment for this condition depends on the stage of the condition and may include rest, ice, exercises, medicines, and surgery. Do not use the injured limb to support your body weight until your health care provider says that you can. Take over-the-counter and prescription medicines only as told by your health care provider. Keep all follow-up visits as told by your health care provider. This is important. This information is not  intended to replace advice given to you by your health care provider. Make sure you discuss any questions you have with your health care provider. Document Revised: 01/02/2019 Document Reviewed: 08/05/2018 Elsevier Patient Education  2022 [employer].

## 2021-07-21 ENCOUNTER — Other Ambulatory Visit: Payer: Self-pay

## 2021-07-21 ENCOUNTER — Ambulatory Visit: Payer: Self-pay | Admitting: Registered Nurse

## 2021-07-21 VITALS — BP 121/84 | HR 76 | Temp 98.7°F

## 2021-07-21 DIAGNOSIS — R300 Dysuria: Secondary | ICD-10-CM

## 2021-07-21 LAB — POCT URINALYSIS DIPSTICK
Bilirubin, UA: NEGATIVE
Blood, UA: NEGATIVE
Glucose, UA: NEGATIVE mg/dL
Ketones, POC UA: NEGATIVE mg/dL
Leukocytes, UA: NEGATIVE
Nitrite, UA: NEGATIVE
Protein, UA: NEGATIVE
Specific Gravity, UA: 1.02 (ref 1.005–1.030)
Urobilinogen, UA: 0.2 E.U./dL
pH, UA: 7 (ref 5.0–8.0)

## 2021-07-21 NOTE — Patient Instructions (Signed)
Dysuria ?Dysuria is pain or discomfort during urination. The pain or discomfort may be felt in the part of the body that drains urine from the bladder (urethra) or in the surrounding tissue of the genitals. The pain may also be felt in the groin area, lower abdomen, or lower back. ?You may have to urinate frequently or have the sudden feeling that you have to urinate (urgency). Dysuria can affect anyone, but it is more common in females. Dysuria can be caused by many different things, including: ?Urinary tract infection. ?Kidney stones or bladder stones. ?Certain STIs (sexually transmitted infections), such as chlamydia. ?Dehydration. ?Inflammation of the tissues of the vagina. ?Use of certain medicines. ?Use of certain soaps or scented products that cause irritation. ?Follow these instructions at home: ?Medicines ?Take over-the-counter and prescription medicines only as told by your health care provider. ?If you were prescribed an antibiotic medicine, take it as told by your health care provider. Do not stop taking the antibiotic even if you start to feel better. ?Eating and drinking ? ?Drink enough fluid to keep your urine pale yellow. ?Avoid caffeinated beverages, tea, and alcohol. These beverages can irritate the bladder and make dysuria worse. In males, alcohol may irritate the prostate. ?General instructions ?Watch your condition for any changes. ?Urinate often. Avoid holding urine for long periods of time. ?If you are female, you should wipe from front to back after urinating or having a bowel movement. Use each piece of toilet paper only once. ?Empty your bladder after sex. ?Keep all follow-up visits. This is important. ?If you had any tests done to find the cause of dysuria, it is up to you to get your test results. Ask your health care provider, or the department that is doing the test, when your results will be ready. ?Contact a health care provider if: ?You have a fever. ?You develop pain in your back or  sides. ?You have nausea or vomiting. ?You have blood in your urine. ?You are not urinating as often as you usually do. ?Get help right away if: ?Your pain is severe and not relieved with medicines. ?You cannot eat or drink without vomiting. ?You are confused. ?You have a rapid heartbeat while resting. ?You have shaking or chills. ?You feel extremely weak. ?Summary ?Dysuria is pain or discomfort while urinating. Many different conditions can lead to dysuria. ?If you have dysuria, you may have to urinate frequently or have the sudden feeling that you have to urinate (urgency). ?Watch your condition for any changes. Keep all follow-up visits. ?Make sure that you urinate often and drink enough fluid to keep your urine pale yellow. ?This information is not intended to replace advice given to you by your health care provider. Make sure you discuss any questions you have with your health care provider. ?Document Revised: 04/23/2020 Document Reviewed: 04/23/2020 ?Elsevier Patient Education ? 2022 [employer] Inc[employer] ? ?

## 2021-07-21 NOTE — Progress Notes (Signed)
Subjective:    Patient ID: Alexis Gordon, female    DOB: 1996/06/15, 25 y.o.   MRN: [id number]  25y/o Hispanic established female pt c/o low back pain, suprapubic pain x2 days. Also with malodorous urine. Unsure if uti or yeast infection has clear vaginal discharge also.  Has not noticed any genital rash.  Denied fever/chills/headache/vomiting/diarrhea/hand/feet swelling.     Review of Systems  Constitutional:  Negative for activity change, appetite change, chills, diaphoresis, fatigue and fever.  HENT:  Negative for trouble swallowing and voice change.   Eyes:  Negative for photophobia and visual disturbance.  Respiratory:  Negative for cough, shortness of breath, wheezing and stridor.   Cardiovascular:  Negative for chest pain.  Gastrointestinal:  Positive for abdominal pain.  Endocrine: Negative for cold intolerance and heat intolerance.  Genitourinary:  Positive for vaginal discharge. Negative for decreased urine volume, difficulty urinating, flank pain, genital sores, hematuria, menstrual problem, pelvic pain, urgency, vaginal bleeding and vaginal pain.  Skin:  Negative for rash.  Allergic/Immunologic: Negative for food allergies.  Neurological:  Negative for dizziness, tremors, syncope, facial asymmetry, speech difficulty, weakness, light-headedness and headaches.  Psychiatric/Behavioral:  Negative for agitation, confusion and sleep disturbance.       Objective:   Physical Exam Vitals and nursing note reviewed.  Constitutional:      General: She is awake. She is not in acute distress.    Appearance: Normal appearance. She is well-developed, well-groomed and normal weight. She is not ill-appearing, toxic-appearing or diaphoretic.  HENT:     Head: Normocephalic and atraumatic.     Jaw: There is normal jaw occlusion.     Salivary Glands: Right salivary gland is not diffusely enlarged. Left salivary gland is not diffusely enlarged.     Right Ear: Hearing and external ear  normal.     Left Ear: Hearing and external ear normal.     Nose: Nose normal. No congestion or rhinorrhea.     Mouth/Throat:     Lips: Pink. No lesions.     Mouth: Mucous membranes are moist.     Pharynx: Oropharynx is clear.  Eyes:     General: Lids are normal. Vision grossly intact. Gaze aligned appropriately. No allergic shiner or scleral icterus.       Right eye: No discharge.        Left eye: No discharge.     Extraocular Movements: Extraocular movements intact.     Conjunctiva/sclera: Conjunctivae normal.     Pupils: Pupils are equal, round, and reactive to light.  Neck:     Trachea: Trachea and phonation normal. No tracheal deviation.  Cardiovascular:     Rate and Rhythm: Normal rate and regular rhythm.     Pulses: Normal pulses.          Radial pulses are 2+ on the right side and 2+ on the left side.  Pulmonary:     Effort: Pulmonary effort is normal.     Breath sounds: Normal breath sounds and air entry. No stridor or transmitted upper airway sounds. No wheezing.     Comments: Spoke full sentences without difficulty; no cough observed in exam room Abdominal:     General: Abdomen is flat. Bowel sounds are normal. There is no distension.     Palpations: There is no shifting dullness, fluid wave, hepatomegaly, splenomegaly or mass.     Tenderness: There is no abdominal tenderness. There is no right CVA tenderness, left CVA tenderness, guarding or rebound. Negative signs include  Murphy's sign.     Hernia: No hernia is present.     Comments: Dull to percussion x 4 quads; normoactive bowel sounds x 4 quads; standing to sitting to supine and reversed quickly without assist on exam table  Musculoskeletal:        General: Normal range of motion.     Right hand: No swelling, deformity, lacerations or tenderness. Normal range of motion. Normal strength.     Left hand: No swelling, deformity, lacerations or tenderness. Normal range of motion. Normal strength.     Cervical back: Normal  range of motion and neck supple. No swelling, edema, deformity, erythema, signs of trauma, lacerations, rigidity, spasms, tenderness or crepitus. No pain with movement. Normal range of motion.     Thoracic back: No swelling, edema, deformity, signs of trauma, lacerations or spasms. Normal range of motion.     Lumbar back: No swelling, edema, deformity, signs of trauma, lacerations, spasms or tenderness. Normal range of motion.     Right lower leg: No edema.     Left lower leg: No edema.     Right ankle: No swelling. No tenderness.     Left ankle: No swelling. No tenderness.  Lymphadenopathy:     Head:     Right side of head: No submandibular or preauricular adenopathy.     Left side of head: No submandibular or preauricular adenopathy.     Cervical: No cervical adenopathy.     Right cervical: No superficial cervical adenopathy.    Left cervical: No superficial cervical adenopathy.  Skin:    General: Skin is warm and dry.     Capillary Refill: Capillary refill takes less than 2 seconds.     Coloration: Skin is not ashen, cyanotic, jaundiced, mottled, pale or sallow.     Findings: No abrasion, bruising, burn, erythema, signs of injury, laceration, petechiae, rash or wound.  Neurological:     General: No focal deficit present.     Mental Status: She is alert and oriented to person, place, and time. Mental status is at baseline.     Cranial Nerves: No cranial nerve deficit.     Motor: Motor function is intact. No weakness, tremor, abnormal muscle tone or seizure activity.     Coordination: Coordination is intact. Coordination normal.     Gait: Gait is intact. Gait normal.     Comments: In/out of chair without difficulty; gait sure and steady in clinic; bilateral hand grasp equal 5/5  Psychiatric:        Attention and Perception: Attention and perception normal.        Mood and Affect: Mood and affect normal.        Speech: Speech normal.        Behavior: Behavior normal. Behavior is  cooperative.        Thought Content: Thought content normal.        Cognition and Memory: Cognition and memory normal.        Judgment: Judgment normal.   Results for AGUSTINA, WITZKE (MRN [id number]) as of 07/21/2021 18:21  Ref. Range 07/21/2021 13:05  Bilirubin, UA Latest Ref Range: negative  negative  Clarity, UA Latest Ref Range: clear  clear  Color, UA Latest Ref Range: yellow  yellow  Glucose Latest Ref Range: negative mg/dL negative  Ketones, UA Latest Ref Range: negative mg/dL negative  Leukocytes,UA Latest Ref Range: Negative  Negative  Nitrite, UA Latest Ref Range: Negative  Negative  pH, UA Latest Ref Range:  5.0 - 8.0  7.0  Protein,UA Latest Ref Range: Negative  Negative  Specific Gravity, UA Latest Ref Range: 1.005 - 1.030  1.020  Urobilinogen, UA Latest Ref Range: 0.2 or 1.0 E.U./dL 0.2  RBC, UA Latest Ref Range: negative  negative    Patient notified dipstick urinalysis results normal.  Patient verbalized understanding information no further questions at this time.     Assessment & Plan:   A-Dysuria  P-Medications as directed. Dipstick urinalysis normal.  Urine culture sent to [facility] and typically results in 48-72 hours.  Discussed foods that can cause bladder irritation e.g. alcohol, caffeine, chocolate, asparagus, citrus, tomatoes, spicy foods and carbonated beverages.  Discussed drinking water to keep urine pale yellow clear.  Patient is also to push fluids and may use Pyridium (OTC Azo) 200mg  po TID as needed. Hydrate, avoid dehydration. Avoid holding urine void on frequent basis every 4 to 6 hours. If fever, repetitive vomiting, unable to void every 8 hours or tea/cola colored urine to follow up for re-evaluation with PCM, urgent care or ER same day.  Discussed if rash genital or cloudy vaginal discharge to follow up re-evaluation.  Call or return to clinic as needed if these symptoms worsen or fail to improve as anticipated. Exitcare handout on dysuria offered  patient refused  Patient verbalized agreement and understanding of treatment plan and had no further questions at this time.  P2: Hydrate and cranberry juice

## 2021-07-26 LAB — URINE CULTURE

## 2021-08-11 ENCOUNTER — Other Ambulatory Visit: Payer: Self-pay

## 2021-08-11 ENCOUNTER — Ambulatory Visit: Payer: Self-pay | Admitting: Registered Nurse

## 2021-08-11 ENCOUNTER — Encounter: Payer: Self-pay | Admitting: Registered Nurse

## 2021-08-11 VITALS — BP 115/88 | HR 96

## 2021-08-11 DIAGNOSIS — N3001 Acute cystitis with hematuria: Secondary | ICD-10-CM

## 2021-08-11 DIAGNOSIS — B3731 Acute candidiasis of vulva and vagina: Secondary | ICD-10-CM

## 2021-08-11 LAB — POCT URINALYSIS DIPSTICK
Bilirubin, UA: NEGATIVE
Glucose, UA: NEGATIVE mg/dL
Ketones, POC UA: NEGATIVE mg/dL
Nitrite, UA: NEGATIVE
Protein Ur, POC: NEGATIVE mg/dL
Specific Gravity, UA: 1.03 (ref 1.005–1.030)
Urobilinogen, UA: 0.2 E.U./dL
pH, UA: 6 (ref 5.0–8.0)

## 2021-08-11 MED ORDER — FLUCONAZOLE 150 MG PO TABS: ORAL_TABLET | ORAL | 0 refills | Status: DC

## 2021-08-11 MED ORDER — SULFAMETHOXAZOLE-TRIMETHOPRIM 800-160 MG PO TABS: 1.0000 | ORAL_TABLET | Freq: Two times a day (BID) | ORAL | 0 refills | Status: AC

## 2021-08-11 NOTE — Progress Notes (Signed)
Subjective:    Patient ID: Alexis Gordon, female    DOB: Dec 09, 1995, 25 y.o.   MRN: [id number]  25y/o Hispanic established female pt c/o vaginal itching, irritation, redness x2 days, feels hot/burning. Endorses vaginal discharge that is thick some cloudy streaks and smell as well. Denies headache, hand/feet swelling, abdomen pain, back pain, nausea/vomiting/diarrhea, urinary urgency, frequency, pain with urination, flank pain or hematuria.  Denied changes in sexual partners, soaps/hygiene products, douching.  States has had a lot of stress lately.  Typically 6 hours sleep per night.  Denied history of bacterial vaginosis/vaginal yeast.     Review of Systems  Constitutional:  Negative for activity change, appetite change, chills, diaphoresis, fatigue and fever.  HENT:  Negative for trouble swallowing and voice change.   Eyes:  Negative for photophobia and visual disturbance.  Respiratory:  Negative for cough and wheezing.   Cardiovascular:  Negative for chest pain and leg swelling.  Gastrointestinal:  Negative for abdominal distention, abdominal pain, anal bleeding, blood in stool, constipation, diarrhea, nausea and vomiting.  Endocrine: Negative for cold intolerance and heat intolerance.  Genitourinary:  Positive for vaginal discharge and vaginal pain. Negative for decreased urine volume, difficulty urinating, dyspareunia, dysuria, enuresis, flank pain, frequency, genital sores, hematuria, menstrual problem, urgency and vaginal bleeding.  Musculoskeletal:  Negative for arthralgias, back pain, gait problem, joint swelling, myalgias, neck pain and neck stiffness.  Skin:  Negative for rash.  Allergic/Immunologic: Negative for food allergies.  Neurological:  Negative for dizziness, tremors, seizures, syncope, facial asymmetry, speech difficulty, weakness, light-headedness and headaches.  Hematological:  Negative for adenopathy. Does not bruise/bleed easily.  Psychiatric/Behavioral:   Negative for agitation, confusion and sleep disturbance.       Objective:   Physical Exam Vitals and nursing note reviewed.  Constitutional:      General: She is awake. She is not in acute distress.    Appearance: Normal appearance. She is well-developed, well-groomed and normal weight. She is not ill-appearing, toxic-appearing or diaphoretic.  HENT:     Head: Normocephalic and atraumatic.     Jaw: There is normal jaw occlusion.     Salivary Glands: Right salivary gland is not diffusely enlarged. Left salivary gland is not diffusely enlarged.     Right Ear: Hearing and external ear normal.     Left Ear: Hearing and external ear normal.     Nose: Nose normal. No congestion or rhinorrhea.     Mouth/Throat:     Lips: Pink. No lesions.     Mouth: Mucous membranes are moist. No oral lesions or angioedema.     Dentition: No gum lesions.     Tongue: No lesions. Tongue does not deviate from midline.     Palate: No mass and lesions.     Pharynx: Oropharynx is clear. Uvula midline.  Eyes:     General: Lids are normal. Vision grossly intact. Gaze aligned appropriately. Allergic shiner present. No scleral icterus.       Right eye: No discharge.        Left eye: No discharge.     Extraocular Movements: Extraocular movements intact.     Conjunctiva/sclera: Conjunctivae normal.     Pupils: Pupils are equal, round, and reactive to light.  Neck:     Trachea: Trachea and phonation normal. No tracheal deviation.  Cardiovascular:     Rate and Rhythm: Normal rate and regular rhythm.     Pulses:          Radial pulses are  2+ on the right side and 2+ on the left side.  Pulmonary:     Effort: Pulmonary effort is normal. No respiratory distress.     Breath sounds: Normal breath sounds and air entry. No stridor or transmitted upper airway sounds. No wheezing.     Comments: Spoke full sentences without difficulty; no cough observed in exam room Abdominal:     General: Abdomen is flat. Bowel sounds are  decreased. There is no distension.     Palpations: Abdomen is soft. There is no shifting dullness, fluid wave, hepatomegaly, splenomegaly, mass or pulsatile mass.     Tenderness: There is abdominal tenderness in the suprapubic area. There is no right CVA tenderness, left CVA tenderness, guarding or rebound. Negative signs include Murphy's sign.     Hernia: No hernia is present. There is no hernia in the umbilical area or ventral area.       Comments: Dull to percussion x 4 quads; hypoactive bowel sounds x 4 quads; suprapubic mildly TTP; negative CVA tenderness bilaterally  Genitourinary:    Comments: Pelvic/perineal exam deferred as no speculum available in this clinic Musculoskeletal:        General: No swelling, tenderness or signs of injury. Normal range of motion.     Cervical back: Normal range of motion and neck supple. No swelling, edema, deformity, erythema, signs of trauma, lacerations, rigidity, torticollis, tenderness or crepitus. No pain with movement. Normal range of motion.     Thoracic back: No swelling, edema, deformity, signs of trauma or lacerations. Normal range of motion.     Lumbar back: No swelling, edema, deformity, signs of trauma, lacerations, spasms or tenderness. Normal range of motion.     Right lower leg: No edema.     Left lower leg: No edema.     Right ankle: No swelling.     Left ankle: No swelling.  Lymphadenopathy:     Head:     Right side of head: No submandibular or preauricular adenopathy.     Left side of head: No submandibular or preauricular adenopathy.     Cervical: No cervical adenopathy.     Right cervical: No superficial cervical adenopathy.    Left cervical: No superficial cervical adenopathy.  Skin:    General: Skin is warm and dry.     Capillary Refill: Capillary refill takes less than 2 seconds.     Coloration: Skin is not ashen, cyanotic, jaundiced, mottled, pale or sallow.     Findings: No abrasion, bruising, burn, erythema, signs of  injury, laceration, lesion, petechiae, rash or wound.  Neurological:     General: No focal deficit present.     Mental Status: She is alert and oriented to person, place, and time. Mental status is at baseline.     GCS: GCS eye subscore is 4. GCS verbal subscore is 5. GCS motor subscore is 6.     Cranial Nerves: Cranial nerves 2-12 are intact. No cranial nerve deficit, dysarthria or facial asymmetry.     Sensory: Sensation is intact.     Motor: Motor function is intact. No weakness, tremor, atrophy, abnormal muscle tone or seizure activity.     Coordination: Coordination is intact. Coordination normal.     Gait: Gait is intact. Gait normal.     Comments: In/out of chair and on/off exam table without difficulty; gait sure and steady in clinic; bilateral hand grasp equal 5/5  Psychiatric:        Attention and Perception: Attention and perception normal.  Mood and Affect: Mood and affect normal.        Speech: Speech normal.        Behavior: Behavior normal. Behavior is cooperative.        Thought Content: Thought content normal.        Cognition and Memory: Cognition and memory normal.        Judgment: Judgment normal.       12:23 3 wk ago 7 mo ago 1 yr ago 4 yr ago 5 yr ago 6 yr ago   Color, UA yellow yellow  yellow   yellow      Clarity, UA clear clear  clear   clear      Glucose, UA negative mg/dL negative  negative   negative      Bilirubin, UA negative negative  negative   negative      Ketones, POC UA negative mg/dL negative  negative   negative      Specific Gravity, UA 1.005 - 1.030 1.030  1.020   1.030      Blood, UA negative small Abnormal   negative   small Abnormal       pH, UA 5.0 - 8.0 6.0  7.0   6.0      Protein Ur, POC negative mg/dL negative  Negative R   trace Abnormal       Urobilinogen, UA 0.2 or 1.0 E.U./dL 0.2  0.2   0.2    1.0 R   Nitrite, UA Negative Negative  Negative   Negative      Leukocytes, UA Negative Moderate (2+)         Patient notified of  urinalysis dipstick results in clinic.  Urine culture sent today and results typically available in 72 hours will call patient if resistant to Bactrim DS.  Patient verbalized understanding information and had no further questions at this time.    Assessment & Plan:   A-acute cystitis with hematuria and vulvovaginal candidiasis  P-Start bactrim DS po BID x 3 days #40 RF0 dispensed from [facility] to patient  Medications as directed. Patient is also to push noncaffinated fluids and may use Pyridium 200mg  po TID as needed if bladder spasms/burning with urination. Hydrate preferred with water, avoid dehydration. Avoid holding urine void on frequent basis every 4 to 6 hours. If unable to void every 8 hours follow up for re-evaluation with PCM, urgent care or ER. Call or return to clinic as needed if these symptoms worsen or fail to improve as anticipated. Exitcare handout on UTI printed and given to patient  Patient verbalized agreement and understanding of treatment plan and had no further questions at this time.  P2: Hydrate and cranberry juice    Discussed with patient having stress and less than 7-8 hours of sleep immune system can get run down.  Yeast infections can occur.  Electronic Rx diflucan 150mg  po x 1 now and may repeat in 72 hours if still having discharge/rash #2 RF0 sent to her pharmacy of choice.  Also discussed symptoms of bacterial vaginosis with patient and if no relief of vaginal symptoms/improving over next 48 hours to notify me and will send in Rx for metronidazole gel 1 applicatorful per vagina at bedtime 0.75% x 5 days #1 RF0  Exitcare handouts printed and given on vulvovaginal yeast and bacterial vaginitis.  Avoid scratching affected area/douching/harsh soaps/spermicides/sex until symptoms resolve.  Discussed if bleeding/worsening I recommend seeing her GYN/PCM who can do microscope evaluation of vaginal swab (wet prep)  not available in this clinic and pelvic with speculum/STI testing which  isn't performed at this clinic also.  Patient verbalized understanding information/instructions, agreed with plan of care and had no further questions at this time.

## 2021-08-11 NOTE — Patient Instructions (Signed)
Bacterial Vaginosis Bacterial vaginosis is an infection that occurs when the normal balance of bacteria in the vagina changes. This change is caused by an overgrowth of certain bacteria in the vagina. Bacterial vaginosis is the most common vaginal infection among females aged 26 to 71 years. This condition increases the risk of sexually transmitted infections (STIs). Treatment can help reduce this risk. Treatment is very important for pregnant women because this condition can cause babies to be born early (prematurely) or at a low birth weight. What are the causes? This condition is caused by an increase in harmful bacteria that are normally present in small amounts in the vagina. However, the exact reason this condition develops is not known. You cannot get bacterial vaginosis from toilet seats, bedding, swimming pools, or contact with objects around you. What increases the risk? The following factors may make you more likely to develop this condition: Having a new sexual partner or multiple sexual partners, or having unprotected sex. Douching. Having an intrauterine device (IUD). Smoking. Abusing drugs and alcohol. This may lead to riskier sexual behavior. Taking certain antibiotic medicines. Being pregnant. What are the signs or symptoms? Some women with this condition have no symptoms. Symptoms may include: Wallace Cullens or white vaginal discharge. The discharge can be watery or foamy. A fish-like odor with discharge, especially after sex or during menstruation. Itching in and around the vagina. Burning or pain with urination. How is this diagnosed? This condition is diagnosed based on: Your medical history. A physical exam of the vagina. Checking a sample of vaginal fluid for harmful bacteria or abnormal cells. How is this treated? This condition is treated with antibiotic medicines. These may be given as a pill, a vaginal cream, or a medicine that is put into the vagina (suppository). If the  condition comes back after treatment, a second round of antibiotics may be needed. Follow these instructions at home: Medicines Take or apply over-the-counter and prescription medicines only as told by your health care provider. Take or apply your antibiotic medicine as told by your health care provider. Do not stop using the antibiotic even if you start to feel better. General instructions If you have a female sexual partner, tell her that you have a vaginal infection. She should follow up with her health care provider. If you have a female sexual partner, he does not need treatment. Avoid sexual activity until you finish treatment. Drink enough fluid to keep your urine pale yellow. Keep the area around your vagina and rectum clean. Wash the area daily with warm water. Wipe yourself from front to back after using the toilet. If you are breastfeeding, talk to your health care provider about continuing breastfeeding during treatment. Keep all follow-up visits. This is important. How is this prevented? Self-care Do not douche. Wash the outside of your vagina with warm water only. Wear cotton or cotton-lined underwear. Avoid wearing tight pants and pantyhose, especially during the summer. Safe sex Use protection when having sex. This includes: Using condoms. Using dental dams. This is a thin layer of a material made of latex or polyurethane that protects the mouth during oral sex. Limit the number of sexual partners. To help prevent bacterial vaginosis, it is best to have sex with just one partner (monogamous relationship). Make sure you and your sexual partner are tested for STIs. Drugs and alcohol Do not use any products that contain nicotine or tobacco. These products include cigarettes, chewing tobacco, and vaping devices, such as e-cigarettes. If you need help quitting,  ask your health care provider. Do not use drugs. Do not drink alcohol if: Your health care provider tells you not to  do this. You are pregnant, may be pregnant, or are planning to become pregnant. If you drink alcohol: Limit how much you have to 0-1 drink a day. Be aware of how much alcohol is in your drink. In the [place] one drink equals one 12 oz bottle of beer (355 mL), one 5 oz glass of wine (148 mL), or one 1 oz glass of hard liquor (44 mL). Where to find more information Centers for Disease Control and Prevention: FootballExhibition.com.br American Sexual Health Association (ASHA): www.ashastd.org [place] Department of Health and Health and safety inspector, Office on Women's Health: http://hoffman.com/ Contact a health care provider if: Your symptoms do not improve, even after treatment. You have more discharge or pain when urinating. You have a fever or chills. You have pain in your abdomen or pelvis. You have pain during sex. You have vaginal bleeding between menstrual periods. Summary Bacterial vaginosis is a vaginal infection that occurs when the normal balance of bacteria in the vagina changes. It results from an overgrowth of certain bacteria. This condition increases the risk of sexually transmitted infections (STIs). Getting treated can help reduce this risk. Treatment is very important for pregnant women because this condition can cause babies to be born early (prematurely) or at low birth weight. This condition is treated with antibiotic medicines. These may be given as a pill, a vaginal cream, or a medicine that is put into the vagina (suppository). This information is not intended to replace advice given to you by your health care provider. Make sure you discuss any questions you have with your health care provider. Document Revised: 03/11/2020 Document Reviewed: 03/11/2020 Elsevier Patient Education  2022 [employer] Vaginal Yeast Infection, Adult Vaginal yeast infection is a condition that causes vaginal discharge as well as soreness, swelling, and redness (inflammation) of the vagina. This is a common  condition. Some women get this infection frequently. What are the causes? This condition is caused by a change in the normal balance of the yeast (Candida) and normal bacteria that live in the vagina. This change causes an overgrowth of yeast, which causes the inflammation. What increases the risk? The condition is more likely to develop in women who: Take antibiotic medicines. Have diabetes. Take birth control pills. Are pregnant. Douche often. Have a weak body defense system (immune system). Have been taking steroid medicines for a long time. Frequently wear tight clothing. What are the signs or symptoms? Symptoms of this condition include: White, thick, creamy vaginal discharge. Swelling, itching, redness, and irritation of the vagina. The lips of the vagina (labia) may be affected as well. Pain or a burning feeling while urinating. Pain during sex. How is this diagnosed? This condition is diagnosed based on: Your medical history. A physical exam. A pelvic exam. Your health care provider will examine a sample of your vaginal discharge under a microscope. Your health care provider may send this sample for testing to confirm the diagnosis. How is this treated? This condition is treated with medicine. Medicines may be over-the-counter or prescription. You may be told to use one or more of the following: Medicine that is taken by mouth (orally). Medicine that is applied as a cream (topically). Medicine that is inserted directly into the vagina (suppository). Follow these instructions at home: Take or apply over-the-counter and prescription medicines only as told by your health care provider. Do not use tampons until  your health care provider approves. Do not have sex until your infection has cleared. Sex can prolong or worsen your symptoms of infection. Ask your health care provider when it is safe to resume sexual activity. Keep all follow-up visits. This is important. How is this  prevented?  Do not wear tight clothes, such as pantyhose or tight pants. Wear breathable cotton underwear. Do not use douches, perfumed soap, creams, or powders. Wipe from front to back after using the toilet. If you have diabetes, keep your blood sugar levels under control. Ask your health care provider for other ways to prevent yeast infections. Contact a health care provider if: You have a fever. Your symptoms go away and then return. Your symptoms do not get better with treatment. Your symptoms get worse. You have new symptoms. You develop blisters in or around your vagina. You have blood coming from your vagina and it is not your menstrual period. You develop pain in your abdomen. Summary Vaginal yeast infection is a condition that causes discharge as well as soreness, swelling, and redness (inflammation) of the vagina. This condition is treated with medicine. Medicines may be over-the-counter or prescription. Take or apply over-the-counter and prescription medicines only as told by your health care provider. Do not douche. Resume sexual activity or use of tampons as instructed by your health care provider. Contact a health care provider if your symptoms do not get better with treatment or your symptoms go away and then return. This information is not intended to replace advice given to you by your health care provider. Make sure you discuss any questions you have with your health care provider. Document Revised: 11/29/2020 Document Reviewed: 11/29/2020 Elsevier Patient Education  2022 Else[employer] Inc. Urinary Tract Infection, Adult A urinary tract infection (UTI) is an infection of any part of the urinary tract. The urinary tract includes the kidneys, ureters, bladder, and urethra. These organs make, store, and get rid of urine in the body. An upper UTI affects the ureters and kidneys. A lower UTI affects the bladder and urethra. What are the causes? Most urinary tract infections  are caused by bacteria in your genital area around your urethra, where urine leaves your body. These bacteria grow and cause inflammation of your urinary tract. What increases the risk? You are more likely to develop this condition if: You have a urinary catheter that stays in place. You are not able to control when you urinate or have a bowel movement (incontinence). You are female and you: Use a spermicide or diaphragm for birth control. Have low estrogen levels. Are pregnant. You have certain genes that increase your risk. You are sexually active. You take antibiotic medicines. You have a condition that causes your flow of urine to slow down, such as: An enlarged prostate, if you are female. Blockage in your urethra. A kidney stone. A nerve condition that affects your bladder control (neurogenic bladder). Not getting enough to drink, or not urinating often. You have certain medical conditions, such as: Diabetes. A weak disease-fighting system (immunesystem). Sickle cell disease. Gout. Spinal cord injury. What are the signs or symptoms? Symptoms of this condition include: Needing to urinate right away (urgency). Frequent urination. This may include small amounts of urine each time you urinate. Pain or burning with urination. Blood in the urine. Urine that smells bad or unusual. Trouble urinating. Cloudy urine. Vaginal discharge, if you are female. Pain in the abdomen or the lower back. You may also have: Vomiting or a decreased appetite. Confusion.  Irritability or tiredness. A fever or chills. Diarrhea. The first symptom in older adults may be confusion. In some cases, they may not have any symptoms until the infection has worsened. How is this diagnosed? This condition is diagnosed based on your medical history and a physical exam. You may also have other tests, including: Urine tests. Blood tests. Tests for STIs (sexually transmitted infections). If you have had more  than one UTI, a cystoscopy or imaging studies may be done to determine the cause of the infections. How is this treated? Treatment for this condition includes: Antibiotic medicine. Over-the-counter medicines to treat discomfort. Drinking enough water to stay hydrated. If you have frequent infections or have other conditions such as a kidney stone, you may need to see a health care provider who specializes in the urinary tract (urologist). In rare cases, urinary tract infections can cause sepsis. Sepsis is a life-threatening condition that occurs when the body responds to an infection. Sepsis is treated in the hospital with IV antibiotics, fluids, and other medicines. Follow these instructions at home: Medicines Take over-the-counter and prescription medicines only as told by your health care provider. If you were prescribed an antibiotic medicine, take it as told by your health care provider. Do not stop using the antibiotic even if you start to feel better. General instructions Make sure you: Empty your bladder often and completely. Do not hold urine for long periods of time. Empty your bladder after sex. Wipe from front to back after urinating or having a bowel movement if you are female. Use each tissue only one time when you wipe. Drink enough fluid to keep your urine pale yellow. Keep all follow-up visits. This is important. Contact a health care provider if: Your symptoms do not get better after 1-2 days. Your symptoms go away and then return. Get help right away if: You have severe pain in your back or your lower abdomen. You have a fever or chills. You have nausea or vomiting. Summary A urinary tract infection (UTI) is an infection of any part of the urinary tract, which includes the kidneys, ureters, bladder, and urethra. Most urinary tract infections are caused by bacteria in your genital area. Treatment for this condition often includes antibiotic medicines. If you were  prescribed an antibiotic medicine, take it as told by your health care provider. Do not stop using the antibiotic even if you start to feel better. Keep all follow-up visits. This is important. This information is not intended to replace advice given to you by your health care provider. Make sure you discuss any questions you have with your health care provider. Document Revised: 04/23/2020 Document Reviewed: 04/23/2020 Elsevier Patient Education  2022 [employer].

## 2021-08-16 ENCOUNTER — Telehealth: Payer: Self-pay | Admitting: Registered Nurse

## 2021-08-16 ENCOUNTER — Encounter: Payer: Self-pay | Admitting: Registered Nurse

## 2021-08-16 DIAGNOSIS — N390 Urinary tract infection, site not specified: Secondary | ICD-10-CM

## 2021-08-16 DIAGNOSIS — B951 Streptococcus, group B, as the cause of diseases classified elsewhere: Secondary | ICD-10-CM

## 2021-08-16 LAB — URINE CULTURE

## 2021-08-16 MED ORDER — AMOXICILLIN 500 MG PO CAPS: 500.0000 mg | ORAL_CAPSULE | Freq: Three times a day (TID) | ORAL | 0 refills | Status: AC

## 2021-08-16 MED ORDER — AMOXICILLIN 500 MG PO CAPS: 500.0000 mg | ORAL_CAPSULE | Freq: Three times a day (TID) | ORAL | 0 refills | Status: DC

## 2021-08-16 NOTE — Telephone Encounter (Signed)
Patient contacted via telephone and discussed urine culture results-group B strep 50-100K.  Patient stated she finished 3 day course of Bactrim DS and symptoms improved but still having perineal irritation/rash/burning/tenderness.  Denied fever/chills. Patient reported took first dose diflucan yesterday and still having perineal tenderness/rash.  Discussed may repeat diflucan dose on Thursday 08/18/21 if needed/symptoms not completely resolved.  Discussed amoxicillin recommended treatment for group B strep UTI on urine culture and if worsening dysuria again, cloudy urine, abdomen/back pain, fever, chills to start amoxicillin.  Clinic closed/no PDRx dispense available until next Tuesday 08/23/21.  Patient requested electronic Rx to [facility] [place], [place] Amoxicillin 500mg  po TID x 5-7 days #21 RF0  Discussed with patient if all symptoms resolved after 5 days may stop amoxicillin.  Patient to contact me or see PCM/UC if new/worsening symptoms despite plan of care this weekend.  Discussed alcohol can be a bladder irritant so if having symptoms high intake could worsen them.  Discussed pushing water to keep urine pale yellow and clear in toilet.  Patient verbalized understanding information/instructions, agreed with plan of care and had no further questions at this time.

## 2021-08-26 NOTE — Telephone Encounter (Signed)
Patient returned call stated all symptoms resolved after second diflucan and she did not need to take penicillin Rx.  Patient asked what should happen if she continues to get UTIs.  Discussed if more than 3 UTIs in a year recommend she be evaluated by urology.  Typically low dose daily antibiotics started and imaging/cystoscopy.  Discussed ensured not getting dehydrated or holding urine for extended periods e.g. greater than 4 hours when awake.  Urine should be clear yellow tinged not gold in toilet.  If gold increase water intake.  Patient verbalized understanding information/instructions, agreed with plan of care and had no further questions or concerns at this time.

## 2021-08-31 ENCOUNTER — Ambulatory Visit
Admission: EM | Admit: 2021-08-31 | Discharge: 2021-08-31 | Disposition: A | Discharge status: Home / Self Care | Payer: BC Managed Care – PPO | Attending: Internal Medicine | Admitting: Internal Medicine

## 2021-08-31 ENCOUNTER — Encounter: Payer: Self-pay | Admitting: Emergency Medicine

## 2021-08-31 DIAGNOSIS — B349 Viral infection, unspecified: Secondary | ICD-10-CM

## 2021-08-31 DIAGNOSIS — R112 Nausea with vomiting, unspecified: Secondary | ICD-10-CM

## 2021-08-31 LAB — POCT URINALYSIS DIP (MANUAL ENTRY)
Bilirubin, UA: NEGATIVE
Blood, UA: NEGATIVE
Glucose, UA: NEGATIVE mg/dL
Ketones, POC UA: NEGATIVE mg/dL
Leukocytes, UA: NEGATIVE
Nitrite, UA: NEGATIVE
Protein Ur, POC: NEGATIVE mg/dL
Spec Grav, UA: 1.02 (ref 1.010–1.025)
Urobilinogen, UA: 0.2 E.U./dL
pH, UA: 6.5 (ref 5.0–8.0)

## 2021-08-31 LAB — POCT URINE PREGNANCY: Preg Test, Ur: NEGATIVE

## 2021-08-31 MED ORDER — ONDANSETRON 4 MG PO TBDP: 4.0000 mg | ORAL_TABLET | Freq: Three times a day (TID) | ORAL | 0 refills | Status: DC | PRN
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
## 2021-08-31 NOTE — Discharge Instructions (Signed)
It is suspected that you have a viral illness that should resolve in the next few days with symptomatic treatment.  You have been prescribed nausea medication to take as needed.  Please increase clear oral fluid intake.  COVID-19 and flu test is pending.  We will call if it is positive.  Urine pregnancy was negative.  Urinalysis also did not show any signs of infection.  Please go to the hospital if symptoms worsen.

## 2021-08-31 NOTE — ED Triage Notes (Addendum)
Generalized body aches, headache, nausea, abdominal cramping, bloating, vomiting starting Sunday. States she is late for her period, and that she took one pregnancy test at home and it was negative. Denies cough, nasal congestion, urinary symptoms

## 2021-09-05 ENCOUNTER — Ambulatory Visit (INDEPENDENT_AMBULATORY_CARE_PROVIDER_SITE_OTHER): Payer: BC Managed Care – PPO | Admitting: *Deleted

## 2021-09-05 ENCOUNTER — Encounter: Payer: Self-pay | Admitting: *Deleted

## 2021-09-05 ENCOUNTER — Other Ambulatory Visit: Payer: Self-pay

## 2021-09-05 DIAGNOSIS — Z3200 Encounter for pregnancy test, result unknown: Secondary | ICD-10-CM

## 2021-09-05 DIAGNOSIS — Z3202 Encounter for pregnancy test, result negative: Secondary | ICD-10-CM | POA: Diagnosis not present

## 2021-09-05 LAB — POCT PREGNANCY, URINE: Preg Test, Ur: NEGATIVE

## 2021-09-05 NOTE — Progress Notes (Signed)
Pt submitted urine specimen earlier today for pregnancy test and returned home to await results notification. I called pt and informed her of negative UPT.  She reports LMP 07/31/21.  She states she has not been using birth control. Pt reports that she had an incomplete miscarriage which required surgery in August. She had normal periods in September, October and November. She has been having some light menstrual type cramps and is concerned because she has never missed a period before except when pregnant. I advised that it can take 6 months or so for her body to get back into a regular rhythm following a miscarriage. Pt was advised to wait one week and if she has not started a period, she should check a home UPT. If it is positive, return to our office for confirmation testing. If it is negative and she is still having cramps or merely wants confirmation of negative pregnancy, she may schedule lab appt for BHCG. Pt then stated that she would like to begin Depo Provera injections for pregnancy prevention. I advised that this can be done if she will abstain from sex for 2 weeks and then have negative UPT in office. If pt starts her period, she may call to schedule Depo Provera appt while on her cycle. Pt voiced understanding of instructions given.  Following the call w/pt I reviewed chart and discovered that pt did not have post op appt w/provider in office. Mychart message was then sent to pt letting her know that she will need visit in office w/provider in order to begin contraception.

## 2021-10-19 ENCOUNTER — Ambulatory Visit: Payer: BC Managed Care – PPO | Admitting: Certified Nurse Midwife

## 2021-10-25 ENCOUNTER — Emergency Department (HOSPITAL_COMMUNITY): Payer: BC Managed Care – PPO | Admitting: Anesthesiology

## 2021-10-25 ENCOUNTER — Emergency Department (HOSPITAL_COMMUNITY): Payer: BC Managed Care – PPO

## 2021-10-25 ENCOUNTER — Encounter: Payer: Self-pay | Admitting: Registered Nurse

## 2021-10-25 ENCOUNTER — Other Ambulatory Visit: Payer: Self-pay

## 2021-10-25 ENCOUNTER — Ambulatory Visit: Payer: Self-pay | Admitting: Registered Nurse

## 2021-10-25 ENCOUNTER — Encounter (HOSPITAL_COMMUNITY)
Admission: EM | Disposition: A | Discharge status: Home / Self Care | Payer: Self-pay | Source: Home / Self Care | Attending: Emergency Medicine

## 2021-10-25 ENCOUNTER — Encounter (HOSPITAL_COMMUNITY): Payer: Self-pay

## 2021-10-25 ENCOUNTER — Ambulatory Visit (HOSPITAL_COMMUNITY)
Admission: EM | Admit: 2021-10-25 | Discharge: 2021-10-25 | Disposition: A | Discharge status: Home / Self Care | Payer: BC Managed Care – PPO | Attending: Emergency Medicine | Admitting: Emergency Medicine

## 2021-10-25 VITALS — BP 117/86 | HR 91 | Temp 99.1°F

## 2021-10-25 DIAGNOSIS — R1031 Right lower quadrant pain: Secondary | ICD-10-CM | POA: Diagnosis present

## 2021-10-25 DIAGNOSIS — K3580 Unspecified acute appendicitis: Secondary | ICD-10-CM | POA: Diagnosis not present

## 2021-10-25 HISTORY — PX: LAPAROSCOPIC APPENDECTOMY: SHX408

## 2021-10-25 LAB — POCT URINALYSIS DIPSTICK
Bilirubin, UA: NEGATIVE
Glucose, UA: NEGATIVE mg/dL
Ketones, POC UA: NEGATIVE mg/dL
Leukocytes, UA: NEGATIVE
Nitrite, UA: NEGATIVE
Protein, UA: NEGATIVE
Specific Gravity, UA: 1.02 (ref 1.005–1.030)
Urobilinogen, UA: 0.2 E.U./dL
pH, UA: 6 (ref 5.0–8.0)

## 2021-10-25 LAB — URINALYSIS, ROUTINE W REFLEX MICROSCOPIC
Bilirubin Urine: NEGATIVE
Glucose, UA: NEGATIVE mg/dL
Hgb urine dipstick: NEGATIVE
Ketones, ur: 15 mg/dL — AB
Leukocytes,Ua: NEGATIVE
Nitrite: NEGATIVE
Protein, ur: NEGATIVE mg/dL
Specific Gravity, Urine: 1.02 (ref 1.005–1.030)
pH: 6 (ref 5.0–8.0)

## 2021-10-25 LAB — COMPREHENSIVE METABOLIC PANEL
ALT: 18 U/L (ref 0–44)
AST: 15 U/L (ref 15–41)
Albumin: 4.2 g/dL (ref 3.5–5.0)
Alkaline Phosphatase: 43 U/L (ref 38–126)
Anion gap: 8 (ref 5–15)
BUN: 9 mg/dL (ref 6–20)
CO2: 22 mmol/L (ref 22–32)
Calcium: 9.3 mg/dL (ref 8.9–10.3)
Chloride: 106 mmol/L (ref 98–111)
Creatinine, Ser: 0.68 mg/dL (ref 0.44–1.00)
GFR, Estimated: >60 mL/min (ref >60)
Glucose, Bld: 96 mg/dL (ref 70–99)
Potassium: 3.7 mmol/L (ref 3.5–5.1)
Sodium: 136 mmol/L (ref 135–145)
Total Bilirubin: 0.7 mg/dL (ref 0.3–1.2)
Total Protein: 7.6 g/dL (ref 6.5–8.1)

## 2021-10-25 LAB — CBC
HCT: 41.9 % (ref 36.0–46.0)
Hemoglobin: 14.3 g/dL (ref 12.0–15.0)
MCH: 31.8 pg (ref 26.0–34.0)
MCHC: 34.1 g/dL (ref 30.0–36.0)
MCV: 93.1 fL (ref 80.0–100.0)
Platelets: 223 10*3/uL (ref 150–400)
RBC: 4.5 MIL/uL (ref 3.87–5.11)
RDW: 11.5 % (ref 11.5–15.5)
WBC: 13.5 10*3/uL — ABNORMAL HIGH (ref 4.0–10.5)
nRBC: 0 % (ref 0.0–0.2)

## 2021-10-25 LAB — I-STAT BETA HCG BLOOD, ED (MC, WL, AP ONLY): I-stat hCG, quantitative: 7.2 m[IU]/mL — ABNORMAL HIGH (ref <5)

## 2021-10-25 LAB — PREGNANCY, URINE: Preg Test, Ur: NEGATIVE

## 2021-10-25 LAB — LIPASE, BLOOD: Lipase: 29 U/L (ref 11–51)

## 2021-10-25 SURGERY — APPENDECTOMY, LAPAROSCOPIC
Anesthesia: General | Site: Abdomen

## 2021-10-25 MED ORDER — PROPOFOL 10 MG/ML IV BOLUS
INTRAVENOUS | Status: DC | PRN
Start: 2021-10-25 — End: 2021-10-25
  Administered 2021-10-25: 160 mg via INTRAVENOUS

## 2021-10-25 MED ORDER — ACETAMINOPHEN 500 MG PO TABS: 1000.0000 mg | ORAL_TABLET | Freq: Four times a day (QID) | ORAL | 0 refills | Status: AC | PRN

## 2021-10-25 MED ORDER — BUPIVACAINE-EPINEPHRINE (PF) 0.25% -1:200000 IJ SOLN
INTRAMUSCULAR | Status: AC
  Filled 2021-10-25: qty 30

## 2021-10-25 MED ORDER — ROCURONIUM BROMIDE 10 MG/ML (PF) SYRINGE
PREFILLED_SYRINGE | INTRAVENOUS | Status: AC
  Filled 2021-10-25: qty 10

## 2021-10-25 MED ORDER — METRONIDAZOLE 500 MG/100ML IV SOLN
500.0000 mg | Freq: Once | INTRAVENOUS | Status: AC
  Administered 2021-10-25: 500 mg via INTRAVENOUS
  Filled 2021-10-25: qty 100

## 2021-10-25 MED ORDER — 0.9 % SODIUM CHLORIDE (POUR BTL) OPTIME
TOPICAL | Status: DC | PRN
  Administered 2021-10-25: 1000 mL

## 2021-10-25 MED ORDER — MIDAZOLAM HCL 2 MG/2ML IJ SOLN
INTRAMUSCULAR | Status: AC
  Filled 2021-10-25: qty 2

## 2021-10-25 MED ORDER — ACETAMINOPHEN 160 MG/5ML PO SOLN: 1000.0000 mg | Freq: Once | ORAL | Status: DC | PRN

## 2021-10-25 MED ORDER — MORPHINE SULFATE (PF) 4 MG/ML IV SOLN
4.0000 mg | Freq: Once | INTRAVENOUS | Status: AC
  Administered 2021-10-25: 4 mg via INTRAVENOUS
  Filled 2021-10-25: qty 1

## 2021-10-25 MED ORDER — CHLORHEXIDINE GLUCONATE 0.12 % MT SOLN: 15.0000 mL | Freq: Once | OROMUCOSAL | Status: AC

## 2021-10-25 MED ORDER — FENTANYL CITRATE (PF) 100 MCG/2ML IJ SOLN
25.0000 ug | INTRAMUSCULAR | Status: DC | PRN
  Administered 2021-10-25 (×4): 50 ug via INTRAVENOUS
  Filled 2021-10-25: qty 2

## 2021-10-25 MED ORDER — MIDAZOLAM HCL 2 MG/2ML IJ SOLN
INTRAMUSCULAR | Status: DC | PRN
  Administered 2021-10-25: 2 mg via INTRAVENOUS

## 2021-10-25 MED ORDER — ACETAMINOPHEN 10 MG/ML IV SOLN
INTRAVENOUS | Status: AC
  Filled 2021-10-25: qty 100

## 2021-10-25 MED ORDER — PROMETHAZINE HCL 25 MG/ML IJ SOLN
INTRAMUSCULAR | Status: AC
  Filled 2021-10-25: qty 1

## 2021-10-25 MED ORDER — IOHEXOL 300 MG/ML  SOLN
80.0000 mL | Freq: Once | INTRAMUSCULAR | Status: AC | PRN
  Administered 2021-10-25: 80 mL via INTRAVENOUS

## 2021-10-25 MED ORDER — DEXAMETHASONE SODIUM PHOSPHATE 10 MG/ML IJ SOLN
INTRAMUSCULAR | Status: AC
  Filled 2021-10-25: qty 1

## 2021-10-25 MED ORDER — PROPOFOL 10 MG/ML IV BOLUS
INTRAVENOUS | Status: AC
  Filled 2021-10-25: qty 20

## 2021-10-25 MED ORDER — LACTATED RINGERS IV SOLN: INTRAVENOUS | Status: DC

## 2021-10-25 MED ORDER — ACETAMINOPHEN 10 MG/ML IV SOLN: 1000.0000 mg | Freq: Once | INTRAVENOUS | Status: DC | PRN

## 2021-10-25 MED ORDER — ORAL CARE MOUTH RINSE: 15.0000 mL | Freq: Once | OROMUCOSAL | Status: AC

## 2021-10-25 MED ORDER — KETOROLAC TROMETHAMINE 30 MG/ML IJ SOLN
INTRAMUSCULAR | Status: DC | PRN
  Administered 2021-10-25: 30 mg via INTRAVENOUS

## 2021-10-25 MED ORDER — OXYCODONE HCL 5 MG/5ML PO SOLN: 5.0000 mg | Freq: Once | ORAL | Status: DC | PRN

## 2021-10-25 MED ORDER — SODIUM CHLORIDE 0.9 % IV BOLUS
1000.0000 mL | Freq: Once | INTRAVENOUS | Status: AC
  Administered 2021-10-25: 1000 mL via INTRAVENOUS

## 2021-10-25 MED ORDER — DEXAMETHASONE SODIUM PHOSPHATE 10 MG/ML IJ SOLN
INTRAMUSCULAR | Status: DC | PRN
  Administered 2021-10-25: 5 mg via INTRAVENOUS

## 2021-10-25 MED ORDER — ROCURONIUM BROMIDE 10 MG/ML (PF) SYRINGE
PREFILLED_SYRINGE | INTRAVENOUS | Status: DC | PRN
  Administered 2021-10-25: 70 mg via INTRAVENOUS

## 2021-10-25 MED ORDER — FENTANYL CITRATE (PF) 100 MCG/2ML IJ SOLN
INTRAMUSCULAR | Status: AC
  Filled 2021-10-25: qty 2

## 2021-10-25 MED ORDER — BUPIVACAINE-EPINEPHRINE 0.25% -1:200000 IJ SOLN
INTRAMUSCULAR | Status: DC | PRN
  Administered 2021-10-25: 6 mL

## 2021-10-25 MED ORDER — FENTANYL CITRATE (PF) 250 MCG/5ML IJ SOLN
INTRAMUSCULAR | Status: DC | PRN
  Administered 2021-10-25 (×2): 100 ug via INTRAVENOUS
  Administered 2021-10-25: 50 ug via INTRAVENOUS

## 2021-10-25 MED ORDER — OXYCODONE HCL 5 MG PO TABS: 5.0000 mg | ORAL_TABLET | Freq: Four times a day (QID) | ORAL | 0 refills | Status: DC | PRN

## 2021-10-25 MED ORDER — KETOROLAC TROMETHAMINE 30 MG/ML IJ SOLN
INTRAMUSCULAR | Status: AC
  Filled 2021-10-25: qty 1

## 2021-10-25 MED ORDER — OXYCODONE HCL 5 MG PO TABS: 5.0000 mg | ORAL_TABLET | Freq: Once | ORAL | Status: DC | PRN

## 2021-10-25 MED ORDER — ONDANSETRON HCL 4 MG/2ML IJ SOLN
4.0000 mg | Freq: Once | INTRAMUSCULAR | Status: AC
  Administered 2021-10-25: 4 mg via INTRAVENOUS
  Filled 2021-10-25: qty 2

## 2021-10-25 MED ORDER — SUGAMMADEX SODIUM 200 MG/2ML IV SOLN
INTRAVENOUS | Status: DC | PRN
  Administered 2021-10-25: 200 mg via INTRAVENOUS

## 2021-10-25 MED ORDER — SODIUM CHLORIDE 0.9 % IR SOLN
Status: DC | PRN
  Administered 2021-10-25: 1000 mL

## 2021-10-25 MED ORDER — CEFTRIAXONE SODIUM 2 G IJ SOLR
2.0000 g | Freq: Once | INTRAMUSCULAR | Status: AC
  Administered 2021-10-25: 2 g via INTRAVENOUS
  Filled 2021-10-25: qty 20

## 2021-10-25 MED ORDER — CHLORHEXIDINE GLUCONATE 0.12 % MT SOLN
OROMUCOSAL | Status: AC
  Administered 2021-10-25: 15 mL via OROMUCOSAL
  Filled 2021-10-25: qty 15

## 2021-10-25 MED ORDER — ONDANSETRON HCL 4 MG/2ML IJ SOLN
INTRAMUSCULAR | Status: DC | PRN
  Administered 2021-10-25: 4 mg via INTRAVENOUS

## 2021-10-25 MED ORDER — ONDANSETRON HCL 4 MG/2ML IJ SOLN
INTRAMUSCULAR | Status: AC
  Filled 2021-10-25: qty 2

## 2021-10-25 MED ORDER — LIDOCAINE 2% (20 MG/ML) 5 ML SYRINGE
INTRAMUSCULAR | Status: AC
  Filled 2021-10-25: qty 15

## 2021-10-25 MED ORDER — PROMETHAZINE HCL 25 MG/ML IJ SOLN
6.2500 mg | Freq: Once | INTRAMUSCULAR | Status: AC
  Administered 2021-10-25: 6.25 mg via INTRAVENOUS

## 2021-10-25 MED ORDER — LIDOCAINE 2% (20 MG/ML) 5 ML SYRINGE
INTRAMUSCULAR | Status: DC | PRN
  Administered 2021-10-25: 60 mg via INTRAVENOUS

## 2021-10-25 MED ORDER — ACETAMINOPHEN 10 MG/ML IV SOLN
INTRAVENOUS | Status: DC | PRN
  Administered 2021-10-25: 1000 mg via INTRAVENOUS

## 2021-10-25 MED ORDER — FENTANYL CITRATE (PF) 250 MCG/5ML IJ SOLN
INTRAMUSCULAR | Status: AC
  Filled 2021-10-25: qty 5

## 2021-10-25 MED ORDER — ACETAMINOPHEN 500 MG PO TABS: 1000.0000 mg | ORAL_TABLET | Freq: Once | ORAL | Status: DC | PRN

## 2021-10-25 SURGICAL SUPPLY — 42 items
APPLIER CLIP 5 13 M/L LIGAMAX5 (MISCELLANEOUS)
BAG COUNTER SPONGE SURGICOUNT (BAG) ×2 IMPLANT
CANISTER SUCT 3000ML PPV (MISCELLANEOUS) ×2 IMPLANT
CHLORAPREP W/TINT 26 (MISCELLANEOUS) ×2 IMPLANT
CLIP APPLIE 5 13 M/L LIGAMAX5 (MISCELLANEOUS) IMPLANT
COVER SURGICAL LIGHT HANDLE (MISCELLANEOUS) ×2 IMPLANT
CUTTER FLEX LINEAR 45M (STAPLE) ×2 IMPLANT
DERMABOND ADVANCED (GAUZE/BANDAGES/DRESSINGS) ×1
DERMABOND ADVANCED .7 DNX12 (GAUZE/BANDAGES/DRESSINGS) ×1 IMPLANT
ELECT REM PT RETURN 9FT ADLT (ELECTROSURGICAL) ×2
ELECTRODE REM PT RTRN 9FT ADLT (ELECTROSURGICAL) ×1 IMPLANT
GLOVE SURG ENC MOIS LTX SZ7 (GLOVE) ×2 IMPLANT
GLOVE SURG UNDER POLY LF SZ7.5 (GLOVE) ×2 IMPLANT
GOWN STRL REUS W/ TWL LRG LVL3 (GOWN DISPOSABLE) ×3 IMPLANT
GOWN STRL REUS W/TWL LRG LVL3 (GOWN DISPOSABLE) ×3
GRASPER SUT TROCAR 14GX15 (MISCELLANEOUS) ×2 IMPLANT
KIT BASIN OR (CUSTOM PROCEDURE TRAY) ×2 IMPLANT
KIT TURNOVER KIT B (KITS) ×2 IMPLANT
NS IRRIG 1000ML POUR BTL (IV SOLUTION) ×2 IMPLANT
PAD ARMBOARD 7.5X6 YLW CONV (MISCELLANEOUS) ×4 IMPLANT
POUCH RETRIEVAL ECOSAC 10 (ENDOMECHANICALS) ×1 IMPLANT
POUCH RETRIEVAL ECOSAC 10MM (ENDOMECHANICALS) ×1
RELOAD 45 VASCULAR/THIN (ENDOMECHANICALS) IMPLANT
RELOAD STAPLE 45 2.5 WHT GRN (ENDOMECHANICALS) IMPLANT
RELOAD STAPLE 45 3.5 BLU ETS (ENDOMECHANICALS) IMPLANT
RELOAD STAPLE TA45 3.5 REG BLU (ENDOMECHANICALS) ×2 IMPLANT
SCISSORS LAP 5X35 DISP (ENDOMECHANICALS) IMPLANT
SET IRRIG TUBING LAPAROSCOPIC (IRRIGATION / IRRIGATOR) ×2 IMPLANT
SET TUBE SMOKE EVAC HIGH FLOW (TUBING) ×2 IMPLANT
SHEARS HARMONIC ACE PLUS 36CM (ENDOMECHANICALS) ×2 IMPLANT
SLEEVE ENDOPATH XCEL 5M (ENDOMECHANICALS) ×2 IMPLANT
SPECIMEN JAR SMALL (MISCELLANEOUS) ×2 IMPLANT
STRIP CLOSURE SKIN 1/2X4 (GAUZE/BANDAGES/DRESSINGS) ×2 IMPLANT
SUT MNCRL AB 4-0 PS2 18 (SUTURE) ×2 IMPLANT
SUT VICRYL 0 UR6 27IN ABS (SUTURE) ×2 IMPLANT
TOWEL GREEN STERILE (TOWEL DISPOSABLE) ×2 IMPLANT
TOWEL GREEN STERILE FF (TOWEL DISPOSABLE) ×2 IMPLANT
TRAY FOLEY MTR SLVR 16FR STAT (SET/KITS/TRAYS/PACK) IMPLANT
TRAY LAPAROSCOPIC MC (CUSTOM PROCEDURE TRAY) ×2 IMPLANT
TROCAR XCEL BLUNT TIP 100MML (ENDOMECHANICALS) ×2 IMPLANT
TROCAR XCEL NON-BLD 5MMX100MML (ENDOMECHANICALS) ×2 IMPLANT
WATER STERILE IRR 1000ML POUR (IV SOLUTION) ×1 IMPLANT
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
## 2021-10-25 NOTE — ED Provider Notes (Signed)
[facility] Provider Note   CSN: [id number] Arrival date & time: 10/25/21  1018     History  Chief Complaint  Patient presents with   Abdominal Pain    Alexis Gordon is a 26 y.o. female.   Abdominal Pain Alexis Gordon is a 26 y.o. female with a self-reported PMH of a ruptured ovarian cyst (with first pregnancy) who presents to the ED today with lower right quadrant abdominal pain near the umbilicus that has gradually worsened with associated intermittent nausea since onset on Saturday. This morning she was seen in a clinic where she received Tylenol with minimal temporary relief but was instructed to go to the ED. She rates this pain as an 8/10. Pain radiates "towards the ovaries". The pain subsides somewhat when she does not change positions and worsens with squatting. She is currently sexually active with a female partner and does not use any form of hormonal or barrier contraception. Last unprotected intercourse was 1 week ago. She gets regular menstrual periods with mild cramping. Her last menstrual period was 10/03/21. She notes that this pain is not similar to her menstrual cramps. She denies trauma to the area, vomiting, constipation, and diarrhea.     Home Medications Prior to Admission medications   Medication Sig Start Date End Date Taking? Authorizing Provider  acetaminophen (TYLENOL) 500 MG tablet Take 2 tablets (1,000 mg total) by mouth every 6 (six) hours as needed for up to 3 days for mild pain or moderate pain. 10/25/21 10/28/21  BetancourtJarold Song, Tina A, NP      Allergies    Patient has no known allergies.    Review of Systems   Review of Systems  Gastrointestinal:  Positive for abdominal pain.   Physical Exam Updated Vital Signs BP 126/88 (BP Location: Right Arm)    Pulse 84    Temp 98.5 F (36.9 C) (Oral)    Resp 16    LMP 10/02/2021 (Exact Date)    SpO2 98%  Physical Exam Vitals and nursing note reviewed.   Constitutional:      General: She is not in acute distress.    Comments: Pleasant well-appearing 26 year old.  In no acute distress.  Sitting comfortably in bed.  Able answer questions appropriately follow commands. No increased work of breathing. Speaking in full sentences.   HENT:     Head: Normocephalic and atraumatic.     Nose: Nose normal.  Eyes:     General: No scleral icterus. Cardiovascular:     Rate and Rhythm: Normal rate and regular rhythm.     Pulses: Normal pulses.     Heart sounds: Normal heart sounds.  Pulmonary:     Effort: Pulmonary effort is normal. No respiratory distress.     Breath sounds: No wheezing.  Abdominal:     Palpations: Abdomen is soft.     Tenderness: There is abdominal tenderness in the right lower quadrant. Positive signs include McBurney's sign. Negative signs include Rovsing's sign and psoas sign.  Musculoskeletal:     Cervical back: Normal range of motion.     Right lower leg: No edema.     Left lower leg: No edema.  Skin:    General: Skin is warm and dry.     Capillary Refill: Capillary refill takes less than 2 seconds.  Neurological:     Mental Status: She is alert. Mental status is at baseline.  Psychiatric:        Mood and Affect:  Mood normal.        Behavior: Behavior normal.    ED Results / Procedures / Treatments   Labs (all labs ordered are listed, but only abnormal results are displayed) Labs Reviewed  CBC - Abnormal; Notable for the following components:      Result Value   WBC 13.5 (*)    All other components within normal limits  I-STAT BETA HCG BLOOD, ED ([facility], [facility], [facility] ONLY) - Abnormal; Notable for the following components:   I-stat hCG, quantitative 7.2 (*)    All other components within normal limits  LIPASE, BLOOD  COMPREHENSIVE METABOLIC PANEL  URINALYSIS, ROUTINE W REFLEX MICROSCOPIC  PREGNANCY, URINE    EKG None  Radiology No results found.  Procedures Procedures    Medications Ordered in ED Medications   morphine 4 MG/ML injection 4 mg (has no administration in time range)  ondansetron (ZOFRAN) injection 4 mg (has no administration in time range)  sodium chloride 0.9 % bolus 1,000 mL (has no administration in time range)    ED Course/ Medical Decision Making/ A&P                           Medical Decision Making Amount and/or Complexity of Data Reviewed Labs: ordered.  Risk Prescription drug management.   This patient presents to the ED for concern of 4 days of right lower quadrant abdominal pain, this involves a number of treatment options, and is a complaint that carries with it a high risk of complications and morbidity.  The differential diagnosis includes ectopic pregnancy, ovarian torsion, appendicitis   Co morbidities: Discussed in HPI   Brief History:  Alexis Gordon is a 26 y.o. female with a self-reported PMH of a ruptured ovarian cyst (with first pregnancy) who presents to the ED today with lower right quadrant abdominal pain near the umbilicus that has gradually worsened with associated intermittent nausea since onset on Saturday. This morning she was seen in a clinic where she received Tylenol with minimal temporary relief but was instructed to go to the ED. She rates this pain as an 8/10. Pain radiates "towards the ovaries". The pain subsides somewhat when she does not change positions and worsens with squatting. She is currently sexually active with a female partner and does not use any form of hormonal or barrier contraception. Last unprotected intercourse was 1 week ago. She gets regular menstrual periods with mild cramping. Her last menstrual period was 10/03/21. She notes that this pain is not similar to her menstrual cramps. She denies trauma to the area, vomiting, constipation, and diarrhea.  EMR reviewed including pt PMHx, past surgical history and past visits to ER.   See HPI for more details   Lab Tests:  I ordered and independently interpreted labs.  The  pertinent results include:    Labs notable for elevated WBC UA unremarkable. Lipase WNLs. Preg negative - CT pending. CMP unremarkable.    Imaging Studies:  PENDING CT AP    Cardiac Monitoring:  NA NA   Medicines ordered:  I ordered medication including morphine/zofran/fluids  for abd pain and nausea Reevaluation of the patient after these medicines showed that the patient HAS NOT RECEIVED MEDS YET I have reviewed the patients home medicines and have made adjustments as needed   Critical Interventions:    Consults:      Reevaluation:  After the interventions noted above I re-evaluated patient and found that they have :stayed the  same   Social Determinants of Health:  The patient's social determinants of health were a factor in the care of this patient    Problem List / ED Course:  Abd pain    Dispostion:  After consideration of the diagnostic results and the patients response to treatment, I feel that the patent would benefit from CT scan and reevaluation   4:29 PM Care of PT transferred to [facility] and Dr. Rodena Medin at the end of my shift as the patient will require reassessment once labs/imaging have resulted. Patient presentation, ED course, and plan of care discussed with review of all pertinent labs and imaging. Please see his/her note for further details regarding further ED course and disposition. Plan at time of handoff is CT scan and re-eval. This may be altered or completely changed at the discretion of the oncoming team pending results of further workup.    Final Clinical Impression(s) / ED Diagnoses Final diagnoses:  Right lower quadrant abdominal pain    Rx / DC Orders ED Discharge Orders     None         Gailen Shelter, [place] 10/25/21 1630    Blane Ohara, MD 10/26/21 1625

## 2021-10-25 NOTE — Anesthesia Procedure Notes (Signed)
Procedure Name: Intubation Date/Time: 10/25/2021 6:58 PM Performed by: Mayer Camel, CRNA Pre-anesthesia Checklist: Patient identified, Emergency Drugs available, Suction available and Patient being monitored Patient Re-evaluated:Patient Re-evaluated prior to induction Oxygen Delivery Method: Circle System Utilized Preoxygenation: Pre-oxygenation with 100% oxygen Induction Type: IV induction Ventilation: Mask ventilation without difficulty Laryngoscope Size: Miller and 3 Grade View: Grade I Tube type: Oral Tube size: 6.5 mm Number of attempts: 1 Airway Equipment and Method: Stylet and Oral airway Placement Confirmation: ETT inserted through vocal cords under direct vision, positive ETCO2 and breath sounds checked- equal and bilateral Secured at: 21 cm Tube secured with: Tape Dental Injury: Teeth and Oropharynx as per pre-operative assessment

## 2021-10-25 NOTE — Op Note (Signed)
Preoperative diagnosis:acute appendicitis Postoperative diagnosis: Acute appendicitis Procedure: Laparoscopic appendectomy Surgeon: Dr. Harden Mo Anesthesia: General Estimated blood loss: Minimal Complications: None Drains: Specimens: Appendix to pathology Special count was correct completion Decision to recovery stable condition  Indications: 26 yof with rlq pain and tenderness, elevated wbc, ct with appendicitis. We discussed going to OR for appendectomy.   Procedure: After informed consent was obtained the patient was taken the operating room.  She was already given antibiotics.  SCDs were in place.  She was placed under general anesthesia without complication.  She was prepped and draped in a standard sterile surgical fashion.  Surgical timeout was then performed.  I infiltrated Marcaine below the umbilicus.  I made a vertical incision.  I grasped the fascia and incised this sharply.  I entered the peritoneum bluntly without injury.  I placed a 0 Vicryl pursestring suture through the fascia.  I inserted a Hassan trocar and insufflated the abdomen to 15 mmHg pressure.  I then inserted 2 additional 5 mm trocars in the suprapubic region and left lower quadrant under direct vision.  She was noted to have acute appendicitis.  I identified her terminal ileum and the cecum.  The appendix was dilated all the way down to the base at the cecum.  I took down the appendiceal mesentery with the harmonic scalpel.  I then divided the base and included a small cuff of cecum with a GIA stapler.  This was placed in a retrieval bag and removed from the abdomen.  The staple line was hemostatic.  There is no evidence of any injury.  I then removed the [facility] trocar and tied the pursestring down.  I placed 1 additional 0 Vicryl suture through the umbilicus to completely obliterate that defect.  I then desufflated the abdomen to remove the remaining trocars.  These were closed with 4-0 Monocryl and glue.  She  tolerated his well was extubated transferred to recovery stable

## 2021-10-25 NOTE — ED Triage Notes (Signed)
Pt reports three days of RLQ pain with associated nausea without vomiting. Taking tylenol without relief.

## 2021-10-25 NOTE — Progress Notes (Signed)
Subjective:    Patient ID: Alexis Gordon, female    DOB: [birth year], 26 y.o.   MRN: [id number]  26y/o Hispanic established female pt c/o RLQ pain x1 week. States it is constant while walking or standing but tolerable. Worsens when bending over and when in sitting position has to lean back to help relieve the pain. Describes pain as sharp. Some nausea. Denies vomiting or diarrhea. Denies dysuria symptoms though feels she may be urinating slightly less than normal.   Reported some chills, denied fever.  Stool was loose this am.  Patient doesn't feel she can continue working today. LMP 02 Oct 2021  History of ovarian cyst when she was younger/burst during pregnancy delivery.     Review of Systems  Constitutional:  Positive for appetite change and chills. Negative for activity change, diaphoresis, fatigue and fever.  HENT:  Negative for congestion, sneezing, trouble swallowing and voice change.   Eyes:  Negative for photophobia and visual disturbance.  Respiratory:  Negative for cough, choking, shortness of breath, wheezing and stridor.   Cardiovascular:  Negative for chest pain and leg swelling.  Gastrointestinal:  Positive for abdominal pain and nausea. Negative for abdominal distention, anal bleeding, blood in stool, constipation, rectal pain and vomiting.  Endocrine: Negative for cold intolerance and heat intolerance.  Genitourinary:  Positive for decreased urine volume. Negative for difficulty urinating, dysuria, enuresis, flank pain, frequency, genital sores, hematuria, menstrual problem, vaginal bleeding, vaginal discharge and vaginal pain.  Musculoskeletal:  Negative for arthralgias, back pain, gait problem, joint swelling, myalgias, neck pain and neck stiffness.  Skin:  Negative for rash.  Allergic/Immunologic: Negative for food allergies.  Neurological:  Negative for dizziness, tremors, seizures, syncope, facial asymmetry, speech difficulty, weakness, light-headedness, numbness and  headaches.  Hematological:  Negative for adenopathy. Does not bruise/bleed easily.  Psychiatric/Behavioral:  Negative for agitation, confusion and sleep disturbance.       Objective:   Physical Exam Vitals and nursing note reviewed.  Constitutional:      General: She is awake. She is not in acute distress.    Appearance: Normal appearance. She is well-developed, well-groomed and normal weight. She is not ill-appearing, toxic-appearing or diaphoretic.  HENT:     Head: Normocephalic and atraumatic.     Jaw: There is normal jaw occlusion.     Salivary Glands: Right salivary gland is not diffusely enlarged. Left salivary gland is not diffusely enlarged.     Right Ear: Hearing and external ear normal.     Left Ear: Hearing and external ear normal.     Nose: Nose normal. No congestion or rhinorrhea.     Mouth/Throat:     Lips: Pink. No lesions.     Mouth: Mucous membranes are moist.     Pharynx: Oropharynx is clear. No pharyngeal swelling or oropharyngeal exudate.  Eyes:     General: Lids are normal. Vision grossly intact. Gaze aligned appropriately. Allergic shiner present. No scleral icterus.       Right eye: No discharge.        Left eye: No discharge.     Extraocular Movements: Extraocular movements intact.     Conjunctiva/sclera: Conjunctivae normal.     Pupils: Pupils are equal, round, and reactive to light.  Neck:     Trachea: Trachea and phonation normal. No tracheal deviation.  Cardiovascular:     Rate and Rhythm: Normal rate and regular rhythm.     Pulses: Normal pulses.  Radial pulses are 2+ on the right side and 2+ on the left side.  Pulmonary:     Effort: Pulmonary effort is normal. No respiratory distress.     Breath sounds: Normal breath sounds and air entry. No stridor or transmitted upper airway sounds. No decreased breath sounds, wheezing, rhonchi or rales.     Comments: Spoke full sentences without difficulty; no cough observed in exam room Abdominal:      General: Abdomen is flat. Bowel sounds are decreased. There is no distension or abdominal bruit. There are no signs of injury.     Palpations: Abdomen is soft. There is no fluid wave.     Tenderness: There is abdominal tenderness in the right lower quadrant and left upper quadrant. There is guarding. There is no right CVA tenderness or left CVA tenderness.     Hernia: No hernia is present. There is no hernia in the umbilical area or ventral area.       Comments: Hypoactive bowel sounds x 4 quads; dull to percussion x 4 quads; guarding RUQ/RLQ and squirming on exam table to position away from provider not allowing palpation; LUQ mildly TTP; negative CVA tenderness patient most comfortable propped onto hands leaning back slightly does not want to lie flat supine on exam table  Musculoskeletal:        General: Normal range of motion.     Cervical back: Normal range of motion and neck supple.     Right lower leg: No edema.     Left lower leg: No edema.  Lymphadenopathy:     Head:     Right side of head: No submandibular or preauricular adenopathy.     Left side of head: No submandibular or preauricular adenopathy.     Cervical:     Right cervical: No superficial cervical adenopathy.    Left cervical: No superficial cervical adenopathy.  Skin:    General: Skin is warm and dry.     Capillary Refill: Capillary refill takes less than 2 seconds.     Coloration: Skin is not ashen, cyanotic, jaundiced, mottled, pale or sallow.     Findings: No abrasion, abscess, acne, bruising, burn, ecchymosis, erythema, signs of injury, laceration, lesion, petechiae, rash or wound.     Nails: There is no clubbing.  Neurological:     General: No focal deficit present.     Mental Status: She is alert and oriented to person, place, and time. Mental status is at baseline.     GCS: GCS eye subscore is 4. GCS verbal subscore is 5. GCS motor subscore is 6.     Cranial Nerves: Cranial nerves 2-12 are intact. No cranial  nerve deficit, dysarthria or facial asymmetry.     Sensory: Sensation is intact.     Motor: Motor function is intact. No weakness, tremor, atrophy, abnormal muscle tone or seizure activity.     Coordination: Coordination is intact. Coordination normal.     Gait: Gait is intact. Gait normal.     Comments: In/out of chair without difficulty; gait sure and steady in clinic; bilateral hand grasp equal 5/5; slow on/off exam table  Psychiatric:        Attention and Perception: Attention and perception normal.        Mood and Affect: Mood and affect normal.        Speech: Speech normal.        Behavior: Behavior normal. Behavior is cooperative.        Thought Content: Thought  content normal.        Cognition and Memory: Cognition and memory normal.        Judgment: Judgment normal.     Latest Reference Range & Units 10/25/21 10:15  Bilirubin, UA negative  negative  Clarity, UA clear  clear  Color, UA yellow  yellow  Glucose negative mg/dL negative  Ketones, UA negative mg/dL negative  Leukocytes,UA Negative  Negative  Nitrite, UA Negative  Negative  pH, UA 5.0 - 8.0  6.0  Protein,UA Negative  Negative  Specific Gravity, UA 1.005 - 1.030  1.020  Urobilinogen, UA 0.2 or 1.0 E.U./dL 0.2  RBC, UA negative  trace-intact !  !: Data is abnormal  Patient notified of above results verbally in clinic. Discussed no signs of urine infection at this time could be kidney stone. Urine culture sent to [place]. Typically results available in 72 hours. Patient verbalized understanding and had no further questions at this time.      Assessment & Plan:  A-acute RLQ abdomen pain  P-Discussed differentials with patient kidney stone, appendicitis, ovarian cyst are my top 3. Has zofran at home for prn use.  Patient did not want to start flomax 0.4mg  po daily.  Prefers to go to ER for further work up due to worsening pain and I recommended imaging which cannot be performed in this clinic.  Also due to contract  limiitations I only have NSAIDS/tylenol for pain relief.  Patient stated she is going to [facility] ER now for further evaulation.  Discussed urine culture sent to [place] today and results typically in 72 hours.  Will call once results available.  Dipstick urinalysis today trace RBCs otherwise normal patient notified.  [place] handouts printed and given on abdomen pain, appendicitis, kidney stones and diet to patient.  Patient given 4 UD tylenol 1000mg  po q6h prn pain took first dose in clinic.

## 2021-10-25 NOTE — ED Provider Notes (Signed)
Pt's care assummed at 4pm.  Ct pending.  Ct shows appendicitis.   Pt counseled on results.   General surgeon consulted,  Dr. Donne Hazel will see here.  IV antibiotics ordered    Sidney Ace 10/25/21 1735    Valarie Merino, MD 10/26/21 1147

## 2021-10-25 NOTE — Anesthesia Preprocedure Evaluation (Signed)
Anesthesia Evaluation  Patient identified by MRN, date of birth, ID band Patient awake    Reviewed: Allergy & Precautions, NPO status , Patient's Chart, lab work & pertinent test results  History of Anesthesia Complications Negative for: history of anesthetic complications  Airway Mallampati: III  TM Distance: >3 FB Neck ROM: Full    Dental  (+) Teeth Intact, Dental Advisory Given   Pulmonary neg pulmonary ROS,    breath sounds clear to auscultation       Cardiovascular negative cardio ROS   Rhythm:Regular     Neuro/Psych PSYCHIATRIC DISORDERS Depression negative neurological ROS     GI/Hepatic negative GI ROS, Neg liver ROS,   Endo/Other  negative endocrine ROS  Renal/GU negative Renal ROS     Musculoskeletal negative musculoskeletal ROS (+)   Abdominal   Peds  Hematology negative hematology ROS (+)   Anesthesia Other Findings   Reproductive/Obstetrics Lab Results      Component                Value               Date                      PREGTESTUR               NEGATIVE            10/25/2021                HCG                      7.2 (H)             10/25/2021                HCGQUANT                 4                   04/27/2021                                        Anesthesia Physical Anesthesia Plan  ASA: 1  Anesthesia Plan: General   Post-op Pain Management: Toradol IV (intra-op) and Ofirmev IV (intra-op)   Induction: Intravenous  PONV Risk Score and Plan: 3 and Ondansetron and Dexamethasone  Airway Management Planned: Oral ETT  Additional Equipment: None  Intra-op Plan:   Post-operative Plan: Extubation in OR  Informed Consent: I have reviewed the patients History and Physical, chart, labs and discussed the procedure including the risks, benefits and alternatives for the proposed anesthesia with the patient or authorized representative who has indicated his/her  understanding and acceptance.     Dental advisory given  Plan Discussed with: CRNA and Anesthesiologist  Anesthesia Plan Comments:         Anesthesia Quick Evaluation

## 2021-10-25 NOTE — Patient Instructions (Signed)
Appendicitis, Adult The appendix is a finger-shaped tube that is attached to the large intestine. Appendicitis is inflammation of the appendix. If appendicitis is not treated, it can cause the appendix to tear (rupture). A ruptured appendix can lead to a life-threatening infection. It can also cause a painful collection of pus (abscess) to form in the appendix. What are the causes? This condition may be caused by a blockage in the appendix that leads to infection. The blockage can be caused by: A ball of stool (fecal impaction). Enlarged lymph glands in the intestine. Injury (trauma) to the abdomen. In some cases, the cause may not be known. What increases the risk? This condition is more likely to develop in people who are 75-33 years old. What are the signs or symptoms? Symptoms of this condition include: Pain or tenderness that starts around the belly button and: Moves toward the lower right abdomen. Gets more severe as time passes. Gets worse with coughing or sudden movements. Nausea, vomiting, or loss of appetite. Fever. Constipation or diarrhea. Feeling general discomfort (malaise). How is this diagnosed? This condition may be diagnosed with: A physical exam. Blood tests. Urine test. To confirm the diagnosis, an ultrasound, MRI, or CT scan may be done. How is this treated? This condition can sometimes be treated with antibiotic medicine alone, but it is usually treated with both antibiotics and surgery to remove the appendix (appendectomy). If only antibiotics are given and the appendix is not removed, there is a chance that appendicitis could come back. There are two methods for doing an appendectomy: Open appendectomy. In this surgery, the appendix is removed through a large incision that is made in the lower right abdomen. This procedure may be recommended if: You have major scarring from a previous surgery. You have a bleeding disorder. You are pregnant and are about to give  birth. You have a condition that makes it hard to do surgery through small incisions (laparoscopic procedure). This includes severe infection or a ruptured appendix. Laparoscopic appendectomy. For this method, the appendix is removed through small incisions. This procedure usually causes less pain and fewer problems than an open appendectomy. It also has a shorter recovery time. If the appendix has ruptured and an abscess has formed, a tube (drain) may be placed into the abscess to remove fluid, and antibiotics may be given through an IV. The appendix may or may not need to be removed. Follow these instructions at home: If you had surgery: Follow instructions from your health care provider on how to: Care for yourself at home. Take care of your incision. Medicines Take over-the-counter and prescription medicines only as told by your health care provider. If you were prescribed an antibiotic medicine, take it as told by your health care provider. Do not stop using the antibiotic even if you start to feel better. Ask your health care provider if the medicine prescribed to you: Requires you to avoid driving or using machinery. Can cause constipation. You may need to take these actions to prevent or treat constipation: Drink enough fluid to keep your urine pale yellow. Take over-the-counter or prescription medicines. Eat foods that are high in fiber, such as beans, whole grains, and fresh fruits and vegetables. Limit foods that are high in fat and processed sugars, such as fried or sweet foods. General instructions Follow instructions from your health care provider about eating or drinking restrictions. Do not use any products that contain nicotine or tobacco. These products include cigarettes, chewing tobacco, and vaping devices,  such as e-cigarettes. These can delay incision healing after surgery. If you need help quitting, ask your health care provider. Return to your normal activities as told  by your health care provider. Ask your health care provider what activities are safe for you. Keep all follow-up visits. This is important. Contact a health care provider if: There is pus, blood, or excessive drainage coming from your incision. You have nausea or vomiting. You have a fever. You are feeling tired (fatigue) or muscle aches. Get help right away if: You have worsening abdominal pain that does not get better with medicine. You have shortness of breath. You cannot stop vomiting. These symptoms may represent a serious problem that is an emergency. Do not wait to see if the symptoms will go away. Get medical help right away. Call your local emergency services (911 in the [place] Do not drive yourself to the hospital. Summary Appendicitis is inflammation of the appendix. It can be caused by a blockage in the appendix that leads to infection. This condition is usually treated with both antibiotic medicines and surgery to remove the appendix (appendectomy). Symptoms include pain or tenderness that starts around your belly button and moves toward the lower right abdomen, nausea, vomiting, loss of appetite, fever, constipation or diarrhea, and malaise. To confirm the diagnosis, an ultrasound, MRI, or CT scan may be done. This information is not intended to replace advice given to you by your health care provider. Make sure you discuss any questions you have with your health care provider. Document Revised: 03/03/2021 Document Reviewed: 03/03/2021 Elsevier Patient Education  [place]. Dietary Guidelines to Help Prevent Kidney Stones Kidney stones are deposits of minerals and salts that form inside your kidneys. Your risk of developing kidney stones may be greater depending on your diet, your lifestyle, the medicines you take, and whether you have certain medical conditions. Most people can lower their chances of developing kidney stones by following the instructions below. Your  dietitian may give you more specific instructions depending on your overall health and the type of kidney stones you tend to develop. What are tips for following this plan? Reading food labels  Choose foods with "no salt added" or "low-salt" labels. Limit your salt (sodium) intake to less than 1,500 mg a day. Choose foods with calcium for each meal and snack. Try to eat about 300 mg of calcium at each meal. Foods that contain 200-500 mg of calcium a serving include: 8 oz (237 mL) of milk, calcium-fortifiednon-dairy milk, and calcium-fortifiedfruit juice. Calcium-fortified means that calcium has been added to these drinks. 8 oz (237 mL) of kefir, yogurt, and soy yogurt. 4 oz (114 g) of tofu. 1 oz (28 g) of cheese. 1 cup (150 g) of dried figs. 1 cup (91 g) of cooked broccoli. One 3 oz (85 g) can of sardines or mackerel. Most people need 1,000-1,500 mg of calcium a day. Talk to your dietitian about how much calcium is recommended for you. Shopping Buy plenty of fresh fruits and vegetables. Most people do not need to avoid fruits and vegetables, even if these foods contain nutrients that may contribute to kidney stones. When shopping for convenience foods, choose: Whole pieces of fruit. Pre-made salads with dressing on the side. Low-fat fruit and yogurt smoothies. Avoid buying frozen meals or prepared deli foods. These can be high in sodium. Look for foods with live cultures, such as yogurt and kefir. Choose high-fiber grains, such as whole-wheat breads, oat bran, and wheat cereals. Cooking  Do not add salt to food when cooking. Place a salt shaker on the table and allow each person to add his or her own salt to taste. Use vegetable protein, such as beans, textured vegetable protein (TVP), or tofu, instead of meat in pasta, casseroles, and soups. Meal planning Eat less salt, if told by your dietitian. To do this: Avoid eating processed or pre-made food. Avoid eating fast food. Eat less  animal protein, including cheese, meat, poultry, or fish, if told by your dietitian. To do this: Limit the number of times you have meat, poultry, fish, or cheese each week. Eat a diet free of meat at least 2 days a week. Eat only one serving each day of meat, poultry, fish, or seafood. When you prepare animal protein, cut pieces into small portion sizes. For most meat and fish, one serving is about the size of the palm of your hand. Eat at least five servings of fresh fruits and vegetables each day. To do this: Keep fruits and vegetables on hand for snacks. Eat one piece of fruit or a handful of berries with breakfast. Have a salad and fruit at lunch. Have two kinds of vegetables at dinner. Limit foods that are high in a substance called oxalate. These include: Spinach (cooked), rhubarb, beets, sweet potatoes, and Swiss chard. Peanuts. Potato chips, french fries, and baked potatoes with skin on. Nuts and nut products. Chocolate. If you regularly take a diuretic medicine, make sure to eat at least 1 or 2 servings of fruits or vegetables that are high in potassium each day. These include: Avocado. Banana. Orange, prune, carrot, or tomato juice. Baked potato. Cabbage. Beans and split peas. Lifestyle  Drink enough fluid to keep your urine pale yellow. This is the most important thing you can do. Spread your fluid intake throughout the day. If you drink alcohol: Limit how much you use to: 0-1 drink a day for women who are not pregnant. 0-2 drinks a day for men. Be aware of how much alcohol is in your drink. In the [place] one drink equals one 12 oz bottle of beer (355 mL), one 5 oz glass of wine (148 mL), or one 1 oz glass of hard liquor (44 mL). Lose weight if told by your health care provider. Work with your dietitian to find an eating plan and weight loss strategies that work best for you. General information Talk to your health care provider and dietitian about taking daily  supplements. You may be told the following depending on your health and the cause of your kidney stones: Not to take supplements with vitamin C. To take a calcium supplement. To take a daily probiotic supplement. To take other supplements such as magnesium, fish oil, or vitamin B6. Take over-the-counter and prescription medicines only as told by your health care provider. These include supplements. What foods should I limit? Limit your intake of the following foods, or eat them as told by your dietitian. Vegetables Spinach. Rhubarb. Beets. Canned vegetables. Angie Fava. Olives. Baked potatoes with skin. Grains Wheat bran. Baked goods. Salted crackers. Cereals high in sugar. Meats and other proteins Nuts. Nut butters. Large portions of meat, poultry, or fish. Salted, precooked, or cured meats, such as sausages, meat loaves, and hot dogs. Dairy Cheese. Beverages Regular soft drinks. Regular vegetable juice. Seasonings and condiments Seasoning blends with salt. Salad dressings. Soy sauce. Ketchup. Barbecue sauce. Other foods Canned soups. Canned pasta sauce. Casseroles. Pizza. Lasagna. Frozen meals. Potato chips. [place] fries. The items listed above  may not be a complete list of foods and beverages you should limit. Contact a dietitian for more information. What foods should I avoid? Talk to your dietitian about specific foods you should avoid based on the type of kidney stones you have and your overall health. Fruits Grapefruit. The item listed above may not be a complete list of foods and beverages you should avoid. Contact a dietitian for more information. Summary Kidney stones are deposits of minerals and salts that form inside your kidneys. You can lower your risk of kidney stones by making changes to your diet. The most important thing you can do is drink enough fluid. Drink enough fluid to keep your urine pale yellow. Talk to your dietitian about how much calcium you should have each  day, and eat less salt and animal protein as told by your dietitian. This information is not intended to replace advice given to you by your health care provider. Make sure you discuss any questions you have with your health care provider. Document Revised: 09/04/2019 Document Reviewed: 09/04/2019 Elsevier Patient Education  2022 [place]. Kidney Stones Kidney stones are solid, rock-like deposits that form inside of the kidneys. The kidneys are a pair of organs that make urine. A kidney stone may form in a kidney and move into other parts of the urinary tract, including the tubes that connect the kidneys to the bladder (ureters), the bladder, and the tube that carries urine out of the body (urethra). As the stone moves through these areas, it can cause intense pain and block the flow of urine. Kidney stones are created when high levels of certain minerals are found in the urine. The stones are usually passed out of the body through urination, but in some cases, medical treatment may be needed to remove them. What are the causes? Kidney stones may be caused by: A condition in which certain glands produce too much parathyroid hormone (primary hyperparathyroidism), which causes too much calcium buildup in the blood. A buildup of uric acid crystals in the bladder (hyperuricosuria). Uric acid is a chemical that the body produces when you eat certain foods. It usually exits the body in the urine. Narrowing (stricture) of one or both of the ureters. A kidney blockage that is present at birth (congenital obstruction). Past surgery on the kidney or the ureters, such as gastric bypass surgery. What increases the risk? The following factors may make you more likely to develop this condition: Having had a kidney stone in the past. Having a family history of kidney stones. Not drinking enough water. Eating a diet that is high in protein, salt (sodium), or sugar. Being overweight or obese. What are the  signs or symptoms? Symptoms of a kidney stone may include: Pain in the side of the abdomen, right below the ribs (flank pain). Pain usually spreads (radiates) to the groin. Needing to urinate frequently or urgently. Painful urination. Blood in the urine (hematuria). Nausea. Vomiting. Fever and chills. How is this diagnosed? This condition may be diagnosed based on: Your symptoms and medical history. A physical exam. Blood tests. Urine tests. These may be done before and after the stone passes out of your body through urination. Imaging tests, such as a CT scan, abdominal X-ray, or ultrasound. A procedure to examine the inside of the bladder (cystoscopy). How is this treated? Treatment for kidney stones depends on the size, location, and makeup of the stones. Kidney stones will often pass out of the body through urination. You may need  to: Increase your fluid intake to help pass the stone. In some cases, you may be given fluids through an IV and may need to be monitored at the hospital. Take medicine for pain. Make changes in your diet to help prevent kidney stones from coming back. Sometimes, medical procedures are needed to remove a kidney stone. This may involve: A procedure to break up kidney stones using: A focused beam of light (laser therapy). Shock waves (extracorporeal shock wave lithotripsy). Surgery to remove kidney stones. This may be needed if you have severe pain or have stones that block your urinary tract. Follow these instructions at home: Medicines Take over-the-counter and prescription medicines only as told by your health care provider. Ask your health care provider if the medicine prescribed to you requires you to avoid driving or using heavy machinery. Eating and drinking Drink enough fluid to keep your urine pale yellow. You may be instructed to drink at least 8-10 glasses of water each day. This will help you pass the kidney stone. If directed, change your  diet. This may include: Limiting how much sodium you eat. Eating more fruits and vegetables. Limiting how much animal protein--such as red meat, poultry, fish, and eggs--you eat. Follow instructions from your health care provider about eating or drinking restrictions. General instructions Collect urine samples as told by your health care provider. You may need to collect a urine sample: 24 hours after you pass the stone. 8-12 weeks after passing the kidney stone, and every 6-12 months after that. Strain your urine every time you urinate, for as long as directed. Use the strainer that your health care provider recommends. Do not throw out the kidney stone after passing it. Keep the stone so it can be tested by your health care provider. Testing the makeup of your kidney stone may help prevent you from getting kidney stones in the future. Keep all follow-up visits as told by your health care provider. This is important. You may need follow-up X-rays or ultrasounds to make sure that your stone has passed. How is this prevented? To prevent another kidney stone: Drink enough fluid to keep your urine pale yellow. This is the best way to prevent kidney stones. Eat a healthy diet and follow recommendations from your health care provider about foods to avoid. You may be instructed to eat a low-protein diet. Recommendations vary depending on the type of kidney stone that you have. Maintain a healthy weight. Where to find more information [place] [facility]): www.kidney.[facility]): www.urologyhealth.org Contact a health care provider if: You have pain that gets worse or does not get better with medicine. Get help right away if: You have a fever or chills. You develop severe pain. You develop new abdominal pain. You faint. You are unable to urinate. Summary Kidney stones are solid, rock-like deposits that form inside of the kidneys. Kidney stones can cause  nausea, vomiting, blood in the urine, abdominal pain, and the urge to urinate frequently. Treatment for kidney stones depends on the size, location, and makeup of the stones. Kidney stones will often pass out of the body through urination. Kidney stones can be prevented by drinking enough fluids, eating a healthy diet, and maintaining a healthy weight. This information is not intended to replace advice given to you by your health care provider. Make sure you discuss any questions you have with your health care provider. Document Revised: 01/24/2019 Document Reviewed: 01/28/2019 Elsevier Patient Education  [place]. Abdominal Pain,  Adult Pain in the abdomen (abdominal pain) can be caused by many things. Often, abdominal pain is not serious and it gets better with no treatment or by being treated at home. However, sometimes abdominal pain is serious. Your health care provider will ask questions about your medical history and do a physical exam to try to determine the cause of your abdominal pain. Follow these instructions at home: Medicines Take over-the-counter and prescription medicines only as told by your health care provider. Do not take a laxative unless told by your health care provider. General instructions  Watch your condition for any changes. Drink enough fluid to keep your urine pale yellow. Keep all follow-up visits as told by your health care provider. This is important. Contact a health care provider if: Your abdominal pain changes or gets worse. You are not hungry or you lose weight without trying. You are constipated or have diarrhea for more than 2-3 days. You have pain when you urinate or have a bowel movement. Your abdominal pain wakes you up at night. Your pain gets worse with meals, after eating, or with certain foods. You are vomiting and cannot keep anything down. You have a fever. You have blood in your urine. Get help right away if: Your pain does not go  away as soon as your health care provider told you to expect. You cannot stop vomiting. Your pain is only in areas of the abdomen, such as the right side or the left lower portion of the abdomen. Pain on the right side could be caused by appendicitis. You have bloody or black stools, or stools that look like tar. You have severe pain, cramping, or bloating in your abdomen. You have signs of dehydration, such as: Dark urine, very little urine, or no urine. Cracked lips. Dry mouth. Sunken eyes. Sleepiness. Weakness. You have trouble breathing or chest pain. Summary Often, abdominal pain is not serious and it gets better with no treatment or by being treated at home. However, sometimes abdominal pain is serious. Watch your condition for any changes. Take over-the-counter and prescription medicines only as told by your health care provider. Contact a health care provider if your abdominal pain changes or gets worse. Get help right away if you have severe pain, cramping, or bloating in your abdomen. This information is not intended to replace advice given to you by your health care provider. Make sure you discuss any questions you have with your health care provider. Document Revised: 10/31/2019 Document Reviewed: 01/20/2019 Elsevier Patient Education  [place].

## 2021-10-25 NOTE — ED Provider Triage Note (Signed)
Emergency Medicine Provider Triage Evaluation Note  Almer Littleton , a 26 y.o. female  was evaluated in triage.  Pt complains of RLQ abdominal pain. States that same began 3 days ago and has been worsening since. Endorses associated nausea without vomiting or diarrhea. States that pain is worse with eating. Denies any fever, chills, dysuria, or vaginal discharge. LMP 1/8. No hx of abdominal surgeries  Review of Systems  Positive:  Negative: See above  Physical Exam  BP 126/88 (BP Location: Right Arm)    Pulse 84    Temp 98.5 F (36.9 C) (Oral)    Resp 16    LMP 10/02/2021 (Exact Date)    SpO2 98%  Gen:   Awake, no distress   Resp:  Normal effort  MSK:   Moves extremities without difficulty  Other:  Diffusely tender to palpation of RLQ  Medical Decision Making  Medically screening exam initiated at 10:46 AM.  Appropriate orders placed.  Adelita Hone was informed that the remainder of the evaluation will be completed by another provider, this initial triage assessment does not replace that evaluation, and the importance of remaining in the ED until their evaluation is complete.     Silva Bandy, PA-C 10/25/21 1047

## 2021-10-25 NOTE — Transfer of Care (Signed)
Immediate Anesthesia Transfer of Care Note  Patient: Alexis Gordon  Procedure(s) Performed: APPENDECTOMY LAPAROSCOPIC (Abdomen)  Patient Location: PACU  Anesthesia Type:General  Level of Consciousness: awake, alert  and oriented  Airway & Oxygen Therapy: Patient Spontanous Breathing and Patient connected to face mask oxygen  Post-op Assessment: Report given to RN and Post -op Vital signs reviewed and stable  Post vital signs: Reviewed and stable  Last Vitals:  Vitals Value Taken Time  BP 149/99 10/25/21 1950  Temp 37.1 C 10/25/21 1950  Pulse 97 10/25/21 1959  Resp 17 10/25/21 1959  SpO2 100 % 10/25/21 1959  Vitals shown include unvalidated device data.  Last Pain:  Vitals:   10/25/21 1950  TempSrc:   PainSc: 8       Patients Stated Pain Goal: 3 (10/25/21 1950)  Complications: No notable events documented.

## 2021-10-25 NOTE — Discharge Instructions (Signed)
[facility] LAPAROSCOPIC SURGERY: POST OP INSTRUCTIONS  Always review your discharge instruction sheet given to you by the facility where your surgery was performed. IF YOU HAVE DISABILITY OR FAMILY LEAVE FORMS, YOU MUST BRING THEM TO THE OFFICE FOR PROCESSING.   DO NOT GIVE THEM TO YOUR DOCTOR.  A prescription for pain medication may be given to you upon discharge.  Take your pain medication as prescribed, if needed.  If narcotic pain medicine is not needed, then you may take acetaminophen (Tylenol), naprosyn (Alleve), or ibuprofen (Advil) as needed. Take your usually prescribed medications unless otherwise directed. If you need a refill on your pain medication, please contact your pharmacy.  They will contact our office to request authorization. Prescriptions will not be filled after 5pm or on week-ends. You should follow a light diet the first few days after arrival home, such as soup and crackers, etc.  Be sure to include lots of fluids daily. Most patients will experience some swelling and bruising in the area of the incisions.  Ice packs will help.  Swelling and bruising can take several days to resolve.  It is common to experience some constipation if taking pain medication after surgery.  Increasing fluid intake and taking a stool softener (such as Colace) will usually help or prevent this problem from occurring.  A mild laxative (Milk of Magnesia or Miralax) should be taken according to package instructions if there are no bowel movements after 48 hours. Unless discharge instructions indicate otherwise, you may remove your bandages 48 hours after surgery, and you may shower at that time.  You may have steri-strips (small skin tapes) in place directly over the incision.  These strips should be left on the skin for 7-10 days.  If your surgeon used skin glue on the incision, you may shower in 24 hours.  The glue will flake off over the next  2-3 weeks.  Any sutures or staples will be removed at the office during your follow-up visit. ACTIVITIES:  You may resume regular (light) daily activities beginning the next day--such as daily self-care, walking, climbing stairs--gradually increasing activities as tolerated.  You may have sexual intercourse when it is comfortable.  Refrain from any heavy lifting or straining until approved by your doctor. You may drive when you are no longer taking prescription pain medication, you can comfortably wear a seatbelt, and you can safely maneuver your car and apply brakes. RETURN TO WORK:  __________________________________________________________ You should see your doctor in the office for a follow-up appointment approximately 2-3 weeks after your surgery.  Make sure that you call for this appointment within a day or two after you arrive home to insure a convenient appointment time. OTHER INSTRUCTIONS: __________________________________________________________________________________________________________________________ __________________________________________________________________________________________________________________________ WHEN TO CALL YOUR DOCTOR: Fever over 101.0 Inability to urinate Continued bleeding from incision. Increased pain, redness, or drainage from the incision. Increasing abdominal pain  The clinic staff is available to answer your questions during regular business hours.  Please don't hesitate to call and ask to speak to one of the nurses for clinical concerns.  If you have a medical emergency, go to the nearest emergency room or call 911.  A surgeon from [facility] is always on call at the hospital. [address], [address], [place], [place]  [address] ? [address] [address], [place], [place]   [address] [phone] ? [phone] ? FAX [phone] Web site: [email or web address]  

## 2021-10-26 ENCOUNTER — Encounter (HOSPITAL_COMMUNITY): Payer: Self-pay | Admitting: General Surgery

## 2021-10-26 ENCOUNTER — Other Ambulatory Visit: Payer: Self-pay

## 2021-10-26 ENCOUNTER — Telehealth: Payer: Self-pay | Admitting: Registered Nurse

## 2021-10-26 DIAGNOSIS — K5903 Drug induced constipation: Secondary | ICD-10-CM

## 2021-10-26 MED ORDER — DOCUSATE SODIUM 100 MG PO CAPS: 100.0000 mg | ORAL_CAPSULE | Freq: Two times a day (BID) | ORAL | 0 refills | Status: AC | PRN

## 2021-10-26 NOTE — Telephone Encounter (Signed)
Patient contacted via telephone noted after reviewed Epic had appendectomy last night.  Patient stated discharged to home after surgery.  Has eaten cheese and drank water today.  Some nausea. Discussed clear fluids (soups/popsicles/jello/gatorade/water) today avoid large intake dairy as can worsen constipation.   Avoid fried/spicy/large portions of meat also until tolerating other soft foods first e.g. applesauce/toast/eggs/pasta/mashed potatoes.   When sitting up or moving abdomen pain worse and having shoulder pain.  Stated right sided abdomen pain improved today.  Discussed gas inflated into abdomen during surgery and sometimes a gas bubble can travel under diaphragm or to shoulder and this can be source of pain in shoulder/irritates nerve but should resolve over time.  Discussed trying position change to head down or switch sides/side lying for comfort.  Discussed splinting with pillow for cough/position changes. Peeing normal amount but has not had stool today.  Has taken 2 doses of oxycodone since discharge from hospital.  Discussed ondansetron Rx patient refused at this time.  Discussed with patient due to narcotics, surgery, decreased po intake no stool today not unusual.  Discussed colace Rx and patient stated she would like one sent in.  Colace 100mg  po BID prn constipation #30 RF0 electronically sent to her pharmacy of choice.  Discussed with patient she will probably only need to take for a week/while on narcotics.  Encouraged fluid intake to keep urine pale yellow and clear.  Patient reported abdomen does not feel bloated today.  Cannot see her dressings under nightgown at this time.  Family helping to care for her at home.  Patient asked about CT scan results and HCG.  Discussed 2 HCG tests performed istat 7.5 and repeat lab negative.  No pregnancy seen on CT scan or other abnormalities except for appendix.    CLINICAL DATA:  Right lower quadrant abdominal pain for 3 days, nausea   EXAM: CT  ABDOMEN AND PELVIS WITH CONTRAST   TECHNIQUE: Multidetector CT imaging of the abdomen and pelvis was performed using the standard protocol following bolus administration of intravenous contrast.   RADIATION DOSE REDUCTION: This exam was performed according to the departmental dose-optimization program which includes automated exposure control, adjustment of the mA and/or kV according to patient size and/or use of iterative reconstruction technique.   CONTRAST:  46mL OMNIPAQUE IOHEXOL 300 MG/ML  SOLN   COMPARISON:  08/10/2015   FINDINGS: Lower chest: No acute pleural or parenchymal lung disease.   Hepatobiliary: No focal liver abnormality is seen. No gallstones, gallbladder wall thickening, or biliary dilatation.   Pancreas: Unremarkable. No pancreatic ductal dilatation or surrounding inflammatory changes.   Spleen: Normal in size without focal abnormality.   Adrenals/Urinary Tract: Adrenal glands are unremarkable. Kidneys are normal, without renal calculi, focal lesion, or hydronephrosis. Bladder is unremarkable.   Stomach/Bowel: A dilated thick-walled appendix is seen in the right lower quadrant, measuring up to 1.4 cm in diameter. Mild periappendiceal fat stranding consistent with acute uncomplicated appendicitis. No perforation, fluid collection, or abscess.   No bowel obstruction or ileus.   Vascular/Lymphatic: No significant vascular findings are present. No enlarged abdominal or pelvic lymph nodes.   Reproductive: Uterus and bilateral adnexa are unremarkable.   Other: Trace pelvic free fluid. No free intraperitoneal gas. No abdominal wall hernia.   Musculoskeletal: No acute or destructive bony lesions. Reconstructed images demonstrate no additional findings.   IMPRESSION: 1. Acute uncomplicated appendicitis. No perforation, fluid collection, or abscess. 2. Trace pelvic free fluid.     Electronically Signed   By: 08/12/2015  Manson Passey M.D.   On: 10/25/2021  17:08 Reviewed report verbally with patient.    Latest Reference Range & Units 10/25/21 15:16  Preg Test, Ur NEGATIVE  NEGATIVE    Latest Reference Range & Units 10/25/21 12:21  I-stat hCG, quantitative <5 mIU/mL 7.2 (H)  (H): Data is abnormally high Discussed with patient can repeat pregnancy test later this month if no menses as expected next week.  Discussed to monitor for purulent discharge/frank bleeding and if these occur or worsening abdomen pain/fever or chills to notify her surgeon.  Patient has my after hour contact information [phone] if questions or concerns when [facility] closed.  HR Tonya and Tim notified patient had surgery yesterday and now at home with follow up appt scheduled 11/10/21 for re-evaluation/RTW note.  Patient A&Ox3 spoke full sentences without difficulty.  Patient verbalized understanding information/instructions, agreed with plan of care and had no further questions at this time.

## 2021-10-27 LAB — URINE CULTURE

## 2021-10-27 LAB — SURGICAL PATHOLOGY

## 2021-10-27 NOTE — Anesthesia Postprocedure Evaluation (Signed)
Anesthesia Post Note  Patient: Alexis Gordon  Procedure(s) Performed: APPENDECTOMY LAPAROSCOPIC (Abdomen)     Patient location during evaluation: PACU Anesthesia Type: General Level of consciousness: awake and alert Pain management: pain level controlled Vital Signs Assessment: post-procedure vital signs reviewed and stable Respiratory status: spontaneous breathing, nonlabored ventilation, respiratory function stable and patient connected to nasal cannula oxygen Cardiovascular status: blood pressure returned to baseline and stable Postop Assessment: no apparent nausea or vomiting Anesthetic complications: no   No notable events documented.  Last Vitals:  Vitals:   10/25/21 2050 10/25/21 2129  BP: 119/75 120/76  Pulse: 95 98  Resp: 13 13  Temp: 37 C 37 C  SpO2: 98% 99%    Last Pain:  Vitals:   10/25/21 2129  TempSrc:   PainSc: 4                  Alexis Gordon

## 2021-11-10 NOTE — Telephone Encounter (Addendum)
Reviewed Epic patient RTW 11/03/21 with work restrictions per surgery note.  Next appt with general surgery  11/10/21 at 3 pm, arrival of 2:30 pm.  Will follow up with patient onsite today.

## 2021-11-10 NOTE — Telephone Encounter (Signed)
Patient returned call and stated has follow up appt with surgeon 3pm today.  RTW [employer] with lifting restrictions on Friday 2/10 feeling well except belly button skin a little tender unable to wear jeans as worsens pain.  Wearing leggings.  Denied discharge.  Stools have been normal since the second day after surgery.  Had her menses after surgery.  Still concerned the one pregnancy test was not normal.  Discussed if menses abnormal this month can do HCG level.  Patient reported menses usually 2-3 days long.  Patient had no further questions or concerns at this time.  A&Ox3 spoke full sentences without difficulty.

## 2021-11-14 NOTE — Telephone Encounter (Signed)
Reviewed epic note from 2/16 visit.  Will follow up with patient this week to verify if any questions  "I recommend that you start taking MiraLAX daily for several days to help your bowel movements become more regular  You are healing appropriately without signs of infection. Continue to practice good daily hygiene.   Increase activity as tolerated to regular everyday activity. Consider daily low impact exercise every day such as walking an hour a day.   Do not push through pain. If it hurts to do it, then don't do it.  Diet as tolerated. Low fat high fiber diet ideal. 30 g fiber a day ideal. Consider taking a daily fiber supplement to keep your bowels regular.  Follow up with your primary care physician for other health issues as would normally be done.   Contact us should you have worsening pain, fever > 101, spreading redness from your incisions or persistent nausea and vomiting. Please call if you have any further questions / concerns related to surgery.  Electronically signed by Sidonie Dickens, PA at 11/10/2021 3:18 PM EST"

## 2021-11-16 ENCOUNTER — Ambulatory Visit: Payer: BC Managed Care – PPO | Admitting: Certified Nurse Midwife

## 2021-11-17 ENCOUNTER — Other Ambulatory Visit: Payer: Self-pay | Admitting: *Deleted

## 2021-11-17 ENCOUNTER — Other Ambulatory Visit: Payer: Self-pay

## 2021-11-17 DIAGNOSIS — R140 Abdominal distension (gaseous): Secondary | ICD-10-CM

## 2021-11-17 NOTE — Progress Notes (Unsigned)
Pt endorses "n/v, bloating, morning sickness, tiredness" and requests blood hCG testing. Last menstrual cycle 10/30/21. Discussed in clinic with NP Otila Kluver and orders received.

## 2021-11-17 NOTE — Telephone Encounter (Signed)
Pt endorsed n/v, bloating, morning sickness, tiredness and requests blood hCG testing. Last menstrual cycle 10/30/21. RN Rolly Salter notified me patient request.  Orders placed as had previously discussed with patient if symptomatic and regular menstrual cycle late/not lasting as long as usual we could repeat hcg since istat elevated prior to surgery but urine test normal/negative prior to appendectomy 10/25/21 and patient having a lot of anxiety regarding the results post surgery.  RN Rolly Salter drew serum today and sent to [facility].  Patient A&Ox3 spoke full sentences without difficulty gait sure and steady skin warm dry and pink.  Respirations even and unlabored.

## 2021-11-18 LAB — BETA HCG QUANT (REF LAB): hCG Quant: <1 m[IU]/mL

## 2021-11-18 NOTE — Progress Notes (Signed)
Reviewed results with pt. She reports sx continue. Advised her to take home UPT in one week. If negative, repeat again one week later. Pt agreeable. Denies questions.

## 2021-12-01 NOTE — Telephone Encounter (Signed)
Patient seen in [facility] today feeling well stated abdomen discomfort has resolved and feeling much better this week.  Denied concerns/questions.  Encounter closed. ?

## 2022-02-11 ENCOUNTER — Telehealth: Payer: Self-pay | Admitting: Registered Nurse

## 2022-02-11 ENCOUNTER — Encounter: Payer: Self-pay | Admitting: Registered Nurse

## 2022-02-11 NOTE — Telephone Encounter (Signed)
RN Rolly Salter notified me patient complaining of pregnancy symptoms LMP 2 weeks ago.  Discussed early to perform HCG discussed wait another week to see if symptoms persist as last time symptoms occurred pregnancy test negative also.  RN Rolly Salter stated she would notify patient.

## 2022-02-16 ENCOUNTER — Other Ambulatory Visit: Payer: Self-pay | Admitting: *Deleted

## 2022-02-16 DIAGNOSIS — Z3200 Encounter for pregnancy test, result unknown: Secondary | ICD-10-CM

## 2022-02-16 NOTE — Progress Notes (Signed)
Pt requests HCG testing for pregnancy possibility. Stated by email 02/09/22 that she has been having fatigue, morning sickness, bloating, nausea. LMP 01/21/22. Home upt negative. Orders received for labs to be drawn one week later.

## 2022-02-17 LAB — BETA HCG QUANT (REF LAB): hCG Quant: <1 m[IU]/mL

## 2022-02-17 NOTE — Progress Notes (Signed)
Results reviewed with pt by phone. No menstrual cycle as of this morning. Home upt in two weeks if still no cycle. Pt requested Ob/Gyn referral for birth control if not pregnant. Email sent with local [facility] [employer] office locations and contact information. No referral required. Self scheduling allowed.

## 2022-02-18 NOTE — Progress Notes (Signed)
Noted, reviewed RN Rolly Salter note agreed with plan of care.

## 2022-02-18 NOTE — Telephone Encounter (Signed)
HCG serum results negative.  Patient notified by RN Rolly Salter and will follow up in 2 weeks if no menses for repeat test.  Patient reviewing GYN network provider list to establish care also.

## 2022-02-20 NOTE — Progress Notes (Signed)
Reviewed RN Haley note agreed with plan of care. 

## 2022-03-07 NOTE — Telephone Encounter (Signed)
Case discussed with RN Rolly Salter today to follow up with patient if menses started or if having pregnancy symptoms to repeat HCG

## 2022-05-02 ENCOUNTER — Encounter: Payer: Self-pay | Admitting: Registered Nurse

## 2022-05-02 ENCOUNTER — Other Ambulatory Visit: Payer: Self-pay | Admitting: Registered Nurse

## 2022-05-02 ENCOUNTER — Ambulatory Visit: Payer: BC Managed Care – PPO | Admitting: Registered Nurse

## 2022-05-02 VITALS — BP 106/81 | HR 83 | Temp 98.9°F | Resp 16

## 2022-05-02 DIAGNOSIS — N390 Urinary tract infection, site not specified: Secondary | ICD-10-CM

## 2022-05-02 DIAGNOSIS — R300 Dysuria: Secondary | ICD-10-CM

## 2022-05-02 MED ORDER — SULFAMETHOXAZOLE-TRIMETHOPRIM 800-160 MG PO TABS: 1.0000 | ORAL_TABLET | Freq: Two times a day (BID) | ORAL | 0 refills | Status: DC

## 2022-05-02 MED ORDER — PHENAZOPYRIDINE HCL 200 MG PO TABS: 200.0000 mg | ORAL_TABLET | Freq: Three times a day (TID) | ORAL | 0 refills | Status: DC

## 2022-05-02 NOTE — Patient Instructions (Signed)
Urinary Tract Infection, Adult  A urinary tract infection (UTI) is an infection of any part of the urinary tract. The urinary tract includes the kidneys, ureters, bladder, and urethra. These organs make, store, and get rid of urine in the body. An upper UTI affects the ureters and kidneys. A lower UTI affects the bladder and urethra. What are the causes? Most urinary tract infections are caused by bacteria in your genital area around your urethra, where urine leaves your body. These bacteria grow and cause inflammation of your urinary tract. What increases the risk? You are more likely to develop this condition if: You have a urinary catheter that stays in place. You are not able to control when you urinate or have a bowel movement (incontinence). You are female and you: Use a spermicide or diaphragm for birth control. Have low estrogen levels. Are pregnant. You have certain genes that increase your risk. You are sexually active. You take antibiotic medicines. You have a condition that causes your flow of urine to slow down, such as: An enlarged prostate, if you are female. Blockage in your urethra. A kidney stone. A nerve condition that affects your bladder control (neurogenic bladder). Not getting enough to drink, or not urinating often. You have certain medical conditions, such as: Diabetes. A weak disease-fighting system (immunesystem). Sickle cell disease. Gout. Spinal cord injury. What are the signs or symptoms? Symptoms of this condition include: Needing to urinate right away (urgency). Frequent urination. This may include small amounts of urine each time you urinate. Pain or burning with urination. Blood in the urine. Urine that smells bad or unusual. Trouble urinating. Cloudy urine. Vaginal discharge, if you are female. Pain in the abdomen or the lower back. You may also have: Vomiting or a decreased appetite. Confusion. Irritability or tiredness. A fever or  chills. Diarrhea. The first symptom in older adults may be confusion. In some cases, they may not have any symptoms until the infection has worsened. How is this diagnosed? This condition is diagnosed based on your medical history and a physical exam. You may also have other tests, including: Urine tests. Blood tests. Tests for STIs (sexually transmitted infections). If you have had more than one UTI, a cystoscopy or imaging studies may be done to determine the cause of the infections. How is this treated? Treatment for this condition includes: Antibiotic medicine. Over-the-counter medicines to treat discomfort. Drinking enough water to stay hydrated. If you have frequent infections or have other conditions such as a kidney stone, you may need to see a health care provider who specializes in the urinary tract (urologist). In rare cases, urinary tract infections can cause sepsis. Sepsis is a life-threatening condition that occurs when the body responds to an infection. Sepsis is treated in the hospital with IV antibiotics, fluids, and other medicines. Follow these instructions at home:  Medicines Take over-the-counter and prescription medicines only as told by your health care provider. If you were prescribed an antibiotic medicine, take it as told by your health care provider. Do not stop using the antibiotic even if you start to feel better. General instructions Make sure you: Empty your bladder often and completely. Do not hold urine for long periods of time. Empty your bladder after sex. Wipe from front to back after urinating or having a bowel movement if you are female. Use each tissue only one time when you wipe. Drink enough fluid to keep your urine pale yellow. Keep all follow-up visits. This is important. Contact a health   care provider if: Your symptoms do not get better after 1-2 days. Your symptoms go away and then return. Get help right away if: You have severe pain in  your back or your lower abdomen. You have a fever or chills. You have nausea or vomiting. Summary A urinary tract infection (UTI) is an infection of any part of the urinary tract, which includes the kidneys, ureters, bladder, and urethra. Most urinary tract infections are caused by bacteria in your genital area. Treatment for this condition often includes antibiotic medicines. If you were prescribed an antibiotic medicine, take it as told by your health care provider. Do not stop using the antibiotic even if you start to feel better. Keep all follow-up visits. This is important. This information is not intended to replace advice given to you by your health care provider. Make sure you discuss any questions you have with your health care provider. Document Revised: 04/23/2020 Document Reviewed: 04/23/2020 Elsevier Patient Education  2023 [employer]  

## 2022-05-02 NOTE — Progress Notes (Signed)
Subjective:    Patient ID: Alexis Gordon, female    DOB: [birth year], 26 y.o.   MRN: [id number]  26y/o hispanic female here for  back pain and check for UTI.  Patient has been trying to get pregnant and home pregnancy tests negative since last clinic pregnancy test and menses.  Back pain generalized low started Friday or Saturday and worsening yesterday urinary symptoms smell and feeling different when she pees.  Denied fever/chills/n/v/d/headache/hand/feet swelling/abdomen pain/yeast infection vaginal      Review of Systems  Constitutional:  Negative for activity change, appetite change, chills, diaphoresis, fatigue and fever.  HENT:  Negative for trouble swallowing and voice change.   Eyes:  Negative for photophobia and visual disturbance.  Respiratory:  Negative for cough, shortness of breath, wheezing and stridor.   Cardiovascular:  Negative for leg swelling.  Gastrointestinal:  Negative for abdominal distention, abdominal pain, anal bleeding, blood in stool, constipation, diarrhea, nausea and vomiting.  Endocrine: Negative for cold intolerance and heat intolerance.  Genitourinary:  Positive for dysuria. Negative for decreased urine volume, difficulty urinating, dyspareunia, enuresis, flank pain, frequency, genital sores, hematuria, menstrual problem, urgency, vaginal bleeding, vaginal discharge and vaginal pain.  Musculoskeletal:  Positive for back pain. Negative for gait problem, neck pain and neck stiffness.  Skin:  Negative for rash.  Allergic/Immunologic: Negative for food allergies.  Neurological:  Negative for dizziness, tremors, seizures, syncope, facial asymmetry, speech difficulty, weakness, light-headedness, numbness and headaches.  Hematological:  Negative for adenopathy. Does not bruise/bleed easily.  Psychiatric/Behavioral:  Negative for agitation, confusion and sleep disturbance.        Objective:   Physical Exam Vitals and nursing note reviewed.   Constitutional:      General: She is awake. She is not in acute distress.    Appearance: Normal appearance. She is well-developed, well-groomed and normal weight. She is not ill-appearing, toxic-appearing or diaphoretic.  HENT:     Head: Normocephalic and atraumatic.     Jaw: There is normal jaw occlusion.     Salivary Glands: Right salivary gland is not diffusely enlarged. Left salivary gland is not diffusely enlarged.     Right Ear: Hearing and external ear normal.     Left Ear: Hearing and external ear normal.     Nose: Nose normal. No congestion or rhinorrhea.     Mouth/Throat:     Lips: Pink. No lesions.     Mouth: Mucous membranes are moist. No oral lesions or angioedema.     Dentition: No gum lesions.     Pharynx: Oropharynx is clear. Uvula midline. No uvula swelling.  Eyes:     General: Lids are normal. Vision grossly intact. Gaze aligned appropriately. Allergic shiner present. No scleral icterus.       Right eye: No discharge.        Left eye: No discharge.     Extraocular Movements: Extraocular movements intact.     Conjunctiva/sclera: Conjunctivae normal.     Pupils: Pupils are equal, round, and reactive to light.  Neck:     Trachea: Trachea and phonation normal. No tracheal deviation.  Cardiovascular:     Rate and Rhythm: Normal rate and regular rhythm.     Pulses: Normal pulses.          Radial pulses are 2+ on the right side and 2+ on the left side.     Heart sounds: Normal heart sounds, S1 normal and S2 normal.  Pulmonary:     Effort: Pulmonary effort  is normal. No respiratory distress.     Breath sounds: Normal breath sounds and air entry. No stridor, decreased air movement or transmitted upper airway sounds. No decreased breath sounds, wheezing, rhonchi or rales.     Comments: Spoke full sentences without difficulty; no cough observed in exam room Abdominal:     General: Abdomen is flat. Bowel sounds are decreased. There is no distension.     Palpations: Abdomen  is soft. There is no mass.     Tenderness: There is no abdominal tenderness. There is no right CVA tenderness, left CVA tenderness, guarding or rebound. Negative signs include Murphy's sign.     Hernia: No hernia is present. There is no hernia in the umbilical area or ventral area.     Comments: Dull to percussion x 4 quads; hypoactive bowel sounds x 4 quads; soft nontender  Musculoskeletal:        General: No tenderness or signs of injury. Normal range of motion.     Right hand: Normal strength. Normal capillary refill.     Left hand: Normal strength. Normal capillary refill.     Cervical back: Normal range of motion and neck supple. No swelling, edema, deformity, erythema, signs of trauma, lacerations, rigidity, spasms, tenderness or crepitus. No pain with movement. Normal range of motion.     Thoracic back: No swelling, edema, deformity, signs of trauma, lacerations, spasms, tenderness or bony tenderness. Normal range of motion. No scoliosis.     Lumbar back: No swelling, edema, deformity, signs of trauma, lacerations, spasms, tenderness or bony tenderness. Normal range of motion.       Back:     Right lower leg: No edema.     Left lower leg: No edema.     Comments: Bilateral low back pain no worsening with evaluation/sitting/standing/lying supine on exam table and reverse; no spasms/trigger points palpated  Lymphadenopathy:     Head:     Right side of head: No submandibular or preauricular adenopathy.     Left side of head: No submandibular or preauricular adenopathy.     Cervical: No cervical adenopathy.     Right cervical: No superficial cervical adenopathy.    Left cervical: No superficial cervical adenopathy.  Skin:    General: Skin is warm and dry.     Capillary Refill: Capillary refill takes less than 2 seconds.     Coloration: Skin is not ashen, cyanotic, jaundiced, mottled, pale or sallow.     Findings: No abrasion, bruising, burn, erythema, signs of injury, laceration, lesion,  petechiae, rash or wound.     Nails: There is no clubbing.  Neurological:     General: No focal deficit present.     Mental Status: She is alert and oriented to person, place, and time. Mental status is at baseline.     GCS: GCS eye subscore is 4. GCS verbal subscore is 5. GCS motor subscore is 6.     Cranial Nerves: No cranial nerve deficit, dysarthria or facial asymmetry.     Sensory: Sensation is intact.     Motor: Motor function is intact. No weakness, tremor, atrophy, abnormal muscle tone or seizure activity.     Coordination: Coordination is intact. Coordination normal.     Gait: Gait is intact. Gait normal.     Comments: In/out of chair and on/off exam table without difficulty; gait sure and steady in clinic; bilateral hand grasp equal 5/5  Psychiatric:        Attention and Perception: Attention and  perception normal.        Mood and Affect: Mood and affect normal.        Speech: Speech normal.        Behavior: Behavior normal. Behavior is cooperative.        Thought Content: Thought content normal.        Cognition and Memory: Cognition and memory normal.        Judgment: Judgment normal.     Group B Strep urine culture Nov 2022 otherwise urine cultures negative on epic review in the past year      Assessment & Plan:  A-UTI with microscopic hematuria  P-Dipstick urinalysis POCT and urine culture to [facility]. Discussed with patient urine culture results typically 1-4 days depending on if slow or fast growing bacteria.  Will send my chart note with results.  Dipstick results discussed with patient notified by RN Reece Packer glucose negative, bilirubin negative, ketones negative, specific gravity 1.005, blood trace, pH 6.0, protein trace, urobiligin negative nitrates negative and leukocytes moderate; bactrim DS po BID x 3 days #40 RF0 dispensed from [facility] to patient.  Patient had tried amoxicillin Nov 2022 gave her vaginal yeast infection and symptoms did not resolve.  Course of  bactrim did resolve UTI symptoms at that time.  Discussed may use pyridium OTC 100-200mg  po TID prn dysuria x 48 hours until antibiotic fully effective in blood stream. Discussed will make urine gold/orange color no matter how much water she drinks.   Medications as directed. Patient is also to push fluids. Hydrate, avoid dehydration. Avoid holding urine void on frequent basis every 4 to 6 hours. If unable to void every 8 hours follow up for re-evaluation with PCM, urgent care or ER. Call or return to clinic as needed if these symptoms worsen or fail to improve as anticipated. Exitcare handout on UTI.  Discussed with patient urine culture sent to [facility] will call if drug resistance or antibiotics need to be changed.   Discussed with patient to notify NP if symptoms of yeast infection and will send in Rx for diflucan 150mg  po x1 now and may repeat in 72 hours if needed #2 RF0 to her pharmacy of choice.  Discussed with patient most likely related to increased sexual activity ensure urinating after sex/shower or cleaning of perineum with washcloth. Patient verbalized agreement and understanding of treatment plan and had no further questions at this time.  P2: Hydrate and cranberry juice

## 2022-05-03 ENCOUNTER — Ambulatory Visit (INDEPENDENT_AMBULATORY_CARE_PROVIDER_SITE_OTHER): Payer: BC Managed Care – PPO

## 2022-05-03 ENCOUNTER — Encounter (HOSPITAL_COMMUNITY): Payer: Self-pay

## 2022-05-03 ENCOUNTER — Ambulatory Visit (HOSPITAL_COMMUNITY)
Admission: RE | Admit: 2022-05-03 | Discharge: 2022-05-03 | Disposition: A | Discharge status: Home / Self Care | Payer: BC Managed Care – PPO | Source: Ambulatory Visit | Attending: Emergency Medicine | Admitting: Emergency Medicine

## 2022-05-03 VITALS — BP 133/87 | HR 88 | Temp 98.3°F | Resp 12 | Ht 62.0 in | Wt 155.0 lb

## 2022-05-03 DIAGNOSIS — K5904 Chronic idiopathic constipation: Secondary | ICD-10-CM

## 2022-05-03 DIAGNOSIS — B3731 Acute candidiasis of vulva and vagina: Secondary | ICD-10-CM | POA: Diagnosis present

## 2022-05-03 LAB — POCT URINALYSIS DIPSTICK, ED / UC
Bilirubin Urine: NEGATIVE
Glucose, UA: NEGATIVE mg/dL
Ketones, ur: NEGATIVE mg/dL
Leukocytes,Ua: NEGATIVE
Nitrite: NEGATIVE
Protein, ur: NEGATIVE mg/dL
Specific Gravity, Urine: 1.02 (ref 1.005–1.030)
Urobilinogen, UA: 0.2 mg/dL (ref 0.0–1.0)
pH: 6 (ref 5.0–8.0)

## 2022-05-03 LAB — POC URINE PREG, ED: Preg Test, Ur: NEGATIVE

## 2022-05-03 MED ORDER — KETOROLAC TROMETHAMINE 30 MG/ML IJ SOLN
INTRAMUSCULAR | Status: AC
  Filled 2022-05-03: qty 1

## 2022-05-03 MED ORDER — FLUCONAZOLE 150 MG PO TABS
ORAL_TABLET | ORAL | 0 refills | Status: DC
Start: 2022-05-03 — End: 2022-12-11

## 2022-05-03 MED ORDER — KETOROLAC TROMETHAMINE 30 MG/ML IJ SOLN
30.0000 mg | Freq: Once | INTRAMUSCULAR | Status: AC
  Administered 2022-05-03: 30 mg via INTRAMUSCULAR

## 2022-05-03 NOTE — ED Triage Notes (Signed)
Pt complains of back pain mostly on right side , with white discharge . Pt was diagnosed with a UTI but states med's are not working. Pt states when she takes a deep breath she feels pain on right side x 4 days. Denies fever, nausea
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
## 2022-05-03 NOTE — Discharge Instructions (Signed)
To help clear your bowels that are backed up with excess stool at this time, I recommend the following regimen:  Buy two 32 ounce bottles of Gatorade (any flavor) and two small, 250 g bottles of MiraLAX at the pharmacy.  When you get home, mix 1 bottle of MiraLAX powder into 1 bottle of Gatorade and drink the entire bottle within 1 hour.  After 2 to 3 hours, you will feel the urge to have a bowel movement and hopefully will have one that is significant enough to remove the majority of the stool that is backed up in your bowels at this time.  If you do not feel that this was quite a complete bowel cleanout, please feel free to repeat this process in the morning.  As we discussed, if this does not relieve the pain in your right side it is possible that there could still be a kidney stone present that is just too small for [place] to see on x-ray.  If you feel this is the case, please go to the emergency room for a CT scan of your abdomen.  You can certainly go to the [facility] or [facility] location if you do not wish to go to the main hospital emergency room where the wait times may be longer.  I recommend that you complete 3 days of the Bactrim that you were prescribed.  This will protect you from infection caused by possible kidney stone that we currently cannot see and should not cause you any harm.  I have sent a prescription for Diflucan to your pharmacy for presumed vaginal yeast infection based on your report of white vaginal discharge and burning after urination.  Please take 1 tablet today and take the second tablet 3 days after the first tablet, Saturday.  Thank you for visiting urgent care today.

## 2022-05-04 ENCOUNTER — Telehealth: Payer: Self-pay | Admitting: Registered Nurse

## 2022-05-04 DIAGNOSIS — N390 Urinary tract infection, site not specified: Secondary | ICD-10-CM

## 2022-05-04 LAB — CERVICOVAGINAL ANCILLARY ONLY
Bacterial Vaginitis (gardnerella): NEGATIVE
Candida Glabrata: NEGATIVE
Candida Vaginitis: NEGATIVE
Chlamydia: NEGATIVE
Comment: NEGATIVE
Comment: NEGATIVE
Comment: NEGATIVE
Comment: NEGATIVE
Comment: NEGATIVE
Comment: NORMAL
Neisseria Gonorrhea: NEGATIVE
Trichomonas: NEGATIVE

## 2022-05-04 NOTE — Telephone Encounter (Signed)
Reviewed Epic patient was seen at [facility] yesterday patient noted to be constipated on KUB and treated with diflucan for presumed vaginal candida.  Pregnancy test negative.  Trace hgb on dipstick urinalysis PE TTP abdomen.  Another urine culture collected.  Patient instructed by UC provider to try miralax for constipation.  Will follow up with patient via telephone or if at work in person today. Narrative & Impression  CLINICAL DATA:  Right-sided flank pain.  Urinary tract infection.   EXAM: ABDOMEN - 2 VIEW   COMPARISON:  10/25/2021 CT   FINDINGS: Upright and supine views. The upright view demonstrates no free intraperitoneal air. No significant air fluid levels.   The supine views demonstrate a large colonic stool burden. No bowel obstruction. Stool overlies the renal shadows, but there is no evidence of renal calculi or calcifications over the expected course of the ureters. Mild right hemidiaphragm elevation.   IMPRESSION: 1. No acute findings. 2. Possible constipation.     Electronically Signed   By: Jeronimo Greaves M.D.   On: 05/03/2022 17:30

## 2022-05-04 NOTE — Telephone Encounter (Signed)
Left message for patient.  Has pain gotten worse since I saw you?  Changes in your urine color, amount, frequency? bloody?  Fever/chills/nausea or vomiting?  A kidney stone can cause bad back pain usually only one side or if severe kidney infection.  If infection typically fever and chills too.  If pain on both sides of back may be muscle strain any changes in activity/lifting this weekend/at work?    Try pushing fluids and see if any improvement in symptoms.  If worsening I do recommend seeing your PCM or UC.  An xray or CT scan is sometimes ordered if kidney stone suspected.  I am not back in clinic until Thursday.  We are closed Wed/Sat/Sun.

## 2022-05-04 NOTE — Telephone Encounter (Signed)
[facility] sent preliminary report gram negative rods urine culture.

## 2022-05-05 LAB — URINE CULTURE

## 2022-05-06 LAB — URINE CULTURE: Culture: >=100000 — AB

## 2022-05-06 MED ORDER — NITROFURANTOIN MONOHYD MACRO 100 MG PO CAPS: 100.0000 mg | ORAL_CAPSULE | Freq: Two times a day (BID) | ORAL | 0 refills | Status: AC

## 2022-05-06 NOTE — Telephone Encounter (Signed)
Patient contacted via telephone stated she had received message but had not gone to pharmacy yet to pick up nitrofurantoin/macrobid.  Back pain resolved with miralax clear out.  Still having some burning perineum. Denied fever/chills/back pain/n/v/d.  Discussed urine culture results and resistance to 5 antibiotics.  Ensure to discuss history e.coli with drug resistance if she is seen for UTI in the future.  Encouraged patient to continue hydrating.  She plans to take second diflucan dose tomorrow.  Discussed with patient if vaginal yeast infection returns while on macrobid to notify me and I will call in diflucan refill for her.  Patient verbalized understanding information/instructions, agreed with plan of care and had no further questions at this time.

## 2022-05-06 NOTE — Telephone Encounter (Signed)
Reviewed urine culture results in [facility] portal has not crossed over to Epic at this time.   Urine Culture, Routine  Final report Abnormal   Result 1  Escherichia coli, identified by an automated biochemical system. Cefazolin <=4 ug/mL Cefazolin with an MIC <=16 predicts susceptibility to the oral agents cefaclor, cefdinir, cefpodoxime, cefprozil, cefuroxime, cephalexin, and loracarbef when used for therapy of uncomplicated urinary tract infections due to E. coli, Klebsiella pneumoniae, and Proteus mirabilis. Greater than 100,000 colony forming units per mL Abnormal   Antimicrobial Susceptibility  ** S = Susceptible; I = Intermediate; R = Resistant **                    P = Positive; N = Negative             MICS are expressed in micrograms per mL    Antibiotic                 RSLT#1    RSLT#2    RSLT#3    RSLT#4 Amoxicillin/Clavulanic Acid    S Ampicillin                     R Cefepime                       S Ceftriaxone                    S Cefuroxime                     S Ciprofloxacin                  R Ertapenem                      S Gentamicin                     S Imipenem                       S Levofloxacin                   R Meropenem                      S Nitrofurantoin                 S Piperacillin/Tazobactam        S Tetracycline                   R Tobramycin                     S Trimethoprim/Sulfa             R  E coli resistant to bactrim DS changed to nitrofurantoin 100mg  po BID x 14 days #28 RF0 electronic Rx sent to patient pharmacy of choice and message left for patient with results and instructions.  To contact me if further questions or concerns.

## 2022-05-08 ENCOUNTER — Encounter (HOSPITAL_COMMUNITY): Payer: Self-pay | Admitting: Emergency Medicine

## 2022-05-10 NOTE — Telephone Encounter (Signed)
Patient contacted as signed in on clinic clipboard.  Stated saw RN earlier today for heartburn medication OTC.  It helped symptoms resolved.  Feeling well denied concerns.  Patient reported taking antibiotic for UTI, denied urinary symptoms or problems taking antibiotic.  Urinating without difficulty and tolerating po intake.  Denied further questions or concerns at this time.

## 2022-05-19 ENCOUNTER — Encounter: Payer: Self-pay | Admitting: Registered Nurse

## 2022-05-19 ENCOUNTER — Telehealth: Payer: Self-pay | Admitting: Registered Nurse

## 2022-05-19 DIAGNOSIS — S61239A Puncture wound without foreign body of unspecified finger without damage to nail, initial encounter: Secondary | ICD-10-CM

## 2022-05-19 MED ORDER — TRIPLE ANTIBIOTIC 5-400-5000 EX OINT: TOPICAL_OINTMENT | Freq: Two times a day (BID) | CUTANEOUS | 0 refills | Status: AC | PRN

## 2022-05-19 NOTE — Telephone Encounter (Signed)
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  Self first aid care performed washed hands.  Contacted patient via telephone recommended to keep covered with bandaid and apply neosporin ointment once a day after handwashing and prn dressing changes prn soiling and daily.  Notify NP if worsening swelling/pain/red streaks or discharge at [phone] or [email or web address] this weekend.  Tdap last administered  2018 per Epic chart review no booster indicated at this time.Exitcare handout on puncture wound sent to patient via my chart.  Patient verbalized understanding information/instructions, agreed with plan of care and had no further questions at this time.  [employer] HR team and safety officer notified I followed up with patient and no further needs identified at this time.

## 2022-05-23 ENCOUNTER — Telehealth: Payer: Self-pay | Admitting: Registered Nurse

## 2022-05-23 ENCOUNTER — Encounter: Payer: Self-pay | Admitting: Registered Nurse

## 2022-05-23 DIAGNOSIS — N926 Irregular menstruation, unspecified: Secondary | ICD-10-CM

## 2022-05-23 NOTE — Telephone Encounter (Signed)
Patient reporting 3 days late for menstrual cycle start with cramping, temper and headache.  Trying to get pregnant.  Would like to know when she can take pregnancy test.  Patient notified 10-14 days after missed cycle optimal.  Orders placed for next week and patient to schedule with RN Stone if menses has not started.  Patient verbalized understanding information/instructions, agreed with plan of care and had no further questions at this time.

## 2022-05-23 NOTE — Telephone Encounter (Signed)
Patient seen in workcenter denied pain/signs of infection.  Finger clean dry skin warm pink full arom.  Patient had no further questions or concerns/encounter. Closed.

## 2022-06-01 NOTE — Telephone Encounter (Signed)
Patient reported menses started 6 days after expected date and symptoms resolved does not need follow up labs at this time.

## 2022-06-10 IMAGING — US US OB < 14 WEEKS - US OB TV
1 series · 15 of 28 positions shown · non-contrast
Comparison: None.

CLINICAL DATA: Assess viability. Clinical gestational age by last
menstrual period is 11 weeks and 6 days.

EXAM:
OBSTETRIC <14 WK US AND TRANSVAGINAL OB US
TECHNIQUE: Both transabdominal and transvaginal ultrasound examinations were
performed for complete evaluation of the gestation as well as the
maternal uterus, adnexal regions, and pelvic cul-de-sac.
Transvaginal technique was performed to assess early pregnancy.

[Series 1: us ob < 14 weeks - us ob tv · 86 acquisitions, 15 frames shown]
[im 1/86]
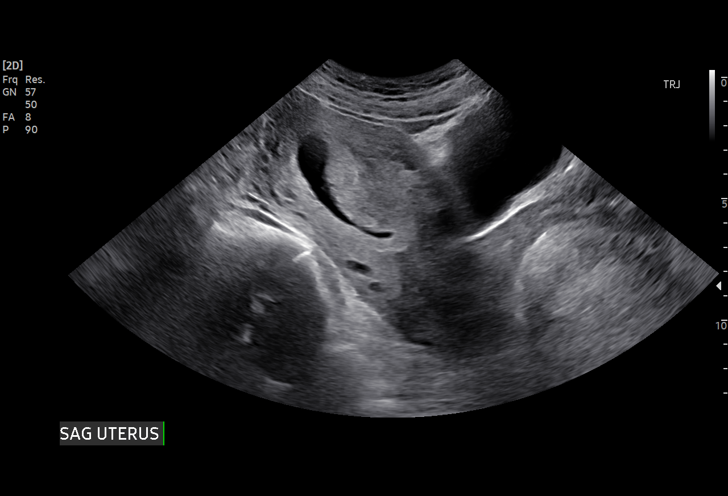
[im 7/86]
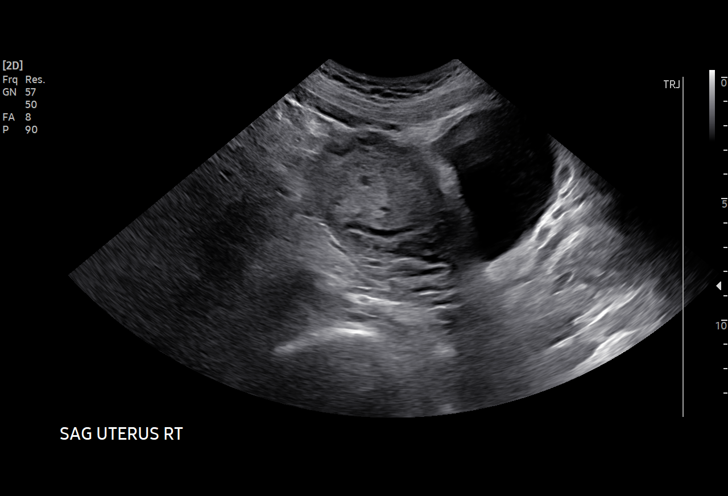
[im 13/86]
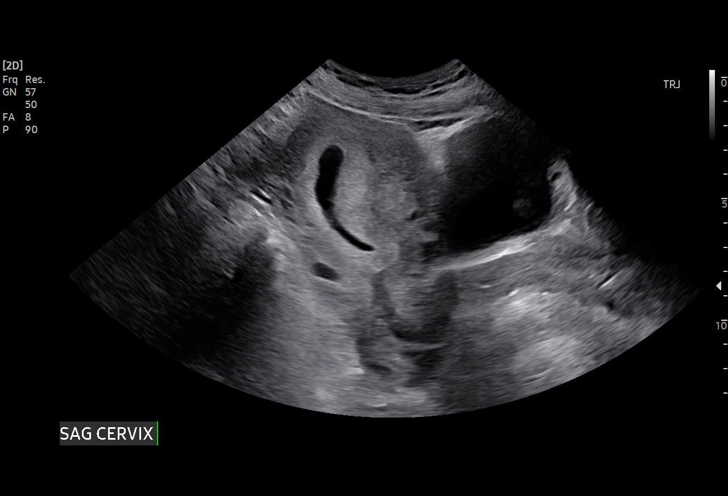
[im 19/86]
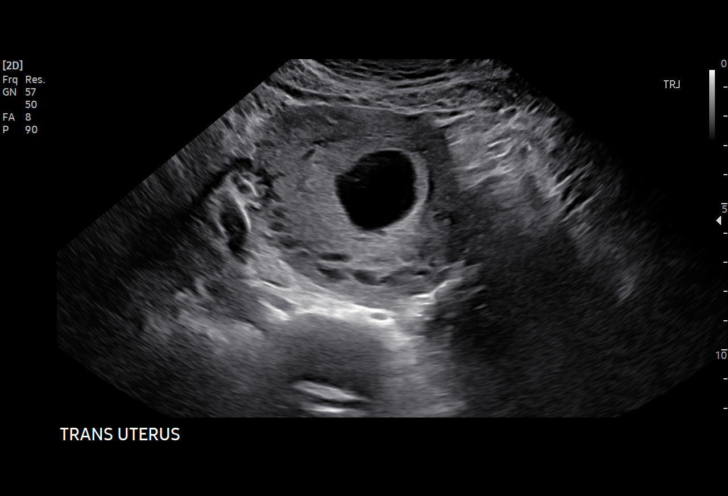
[im 26/86]
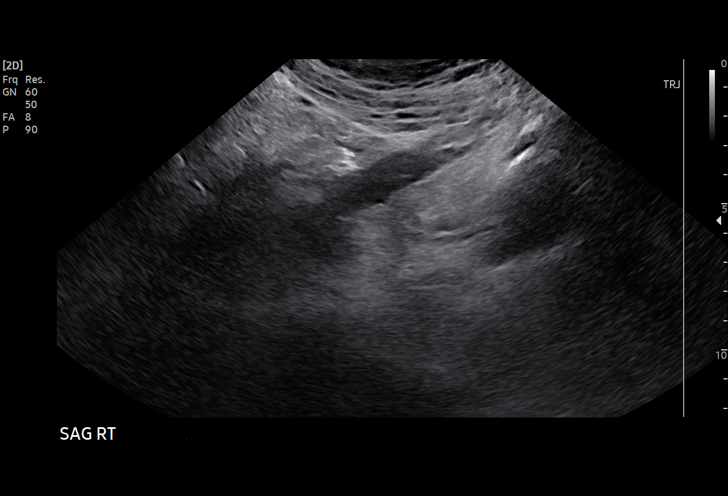
[im 32/86]
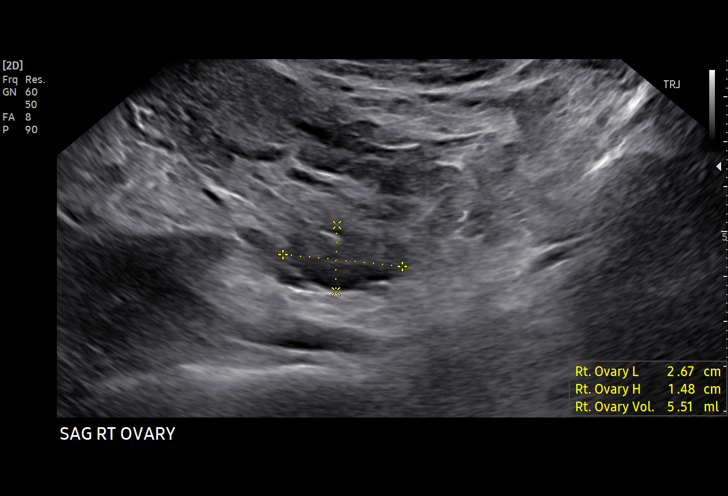
[im 38/86]
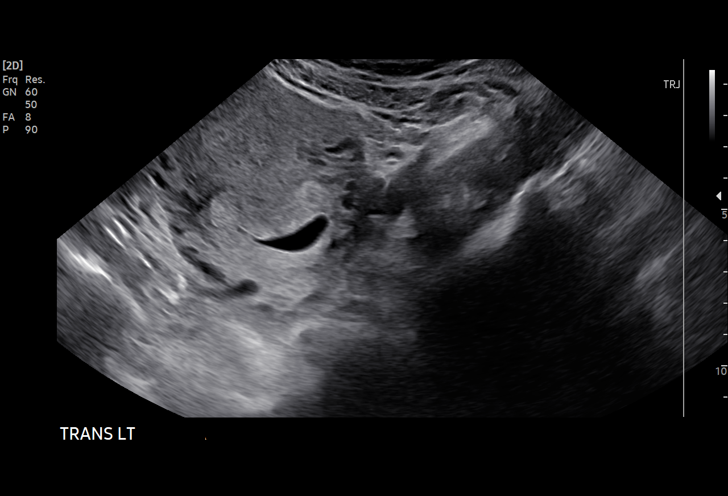
[im 45/86]
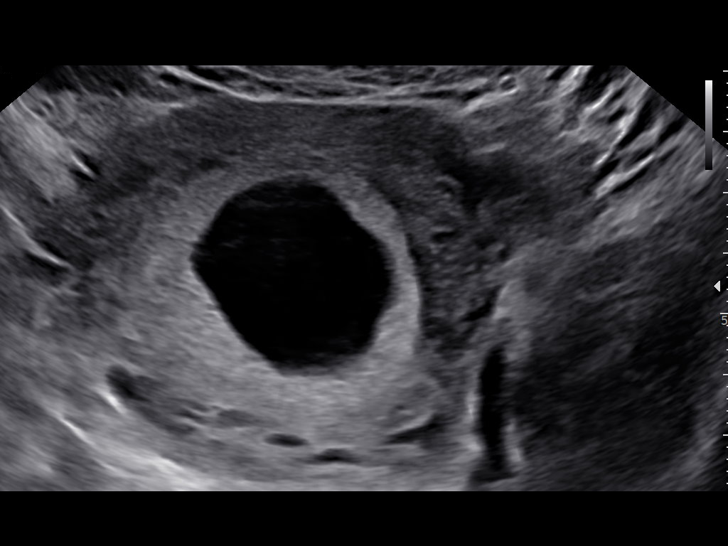
[im 48/86]
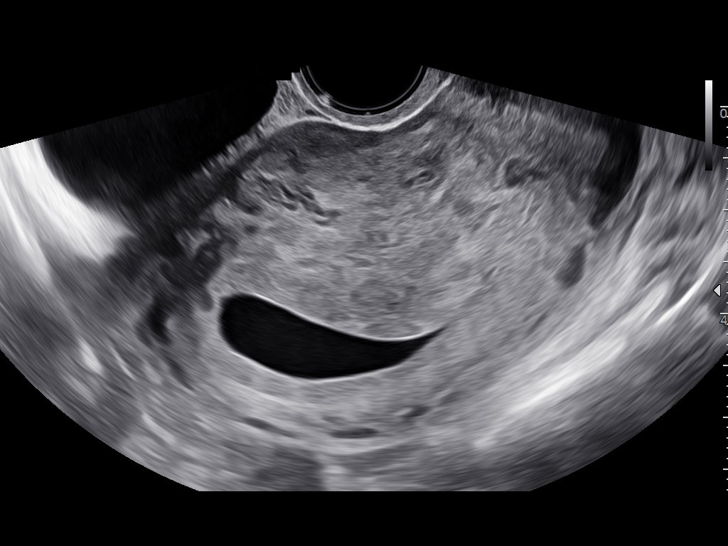
[im 54/86]
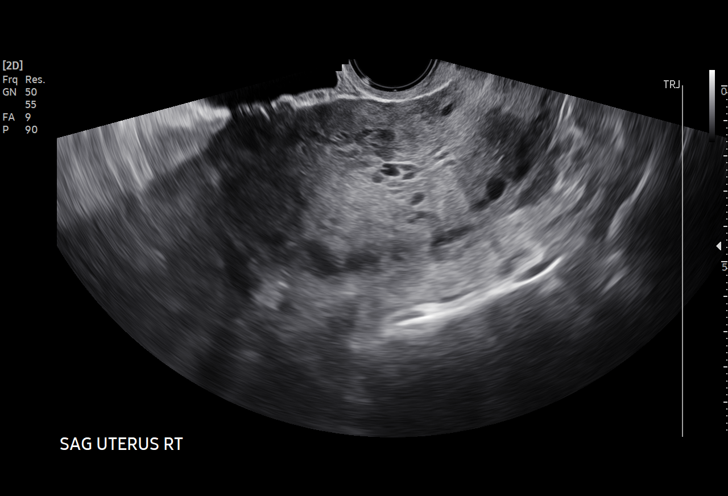
[im 60/86]
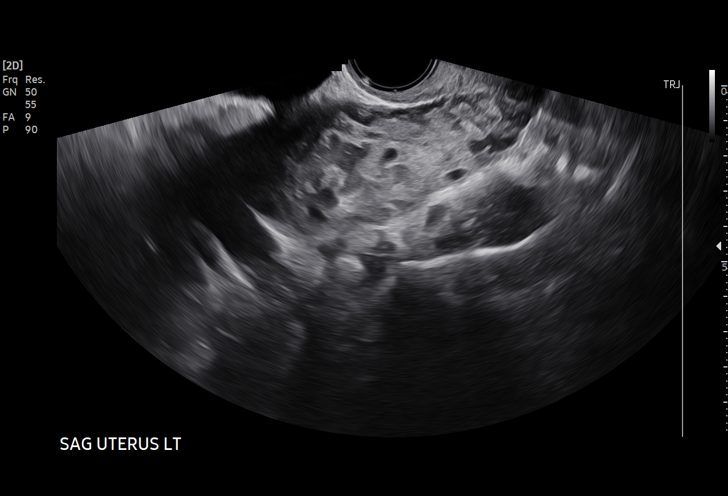
[im 67/86]
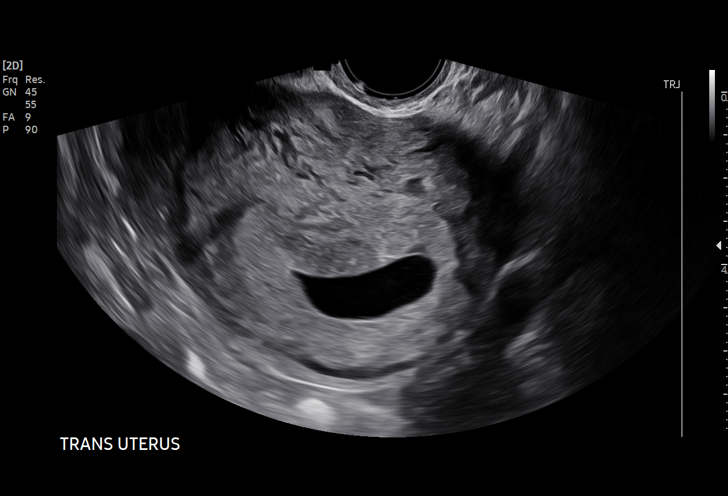
[im 73/86]
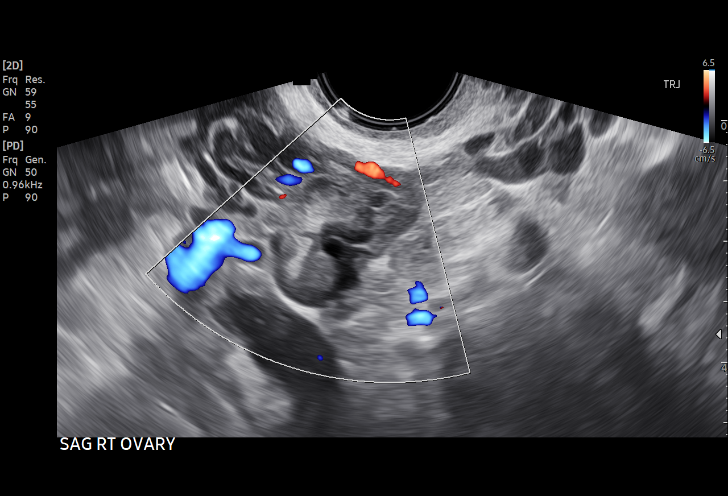
[im 79/86]
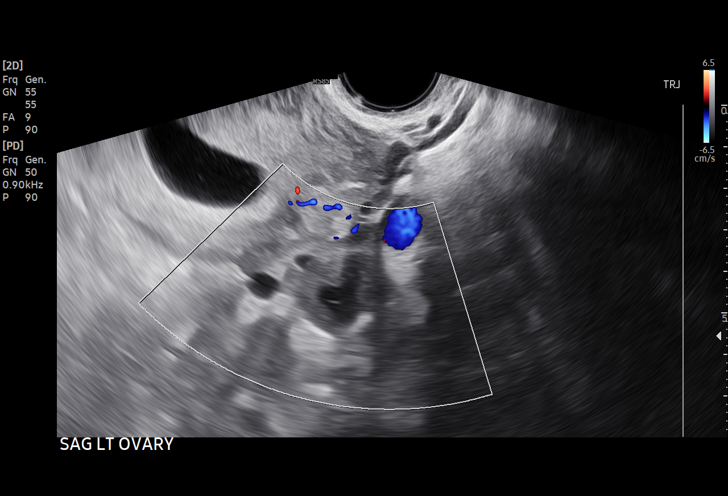
[im 86/86]
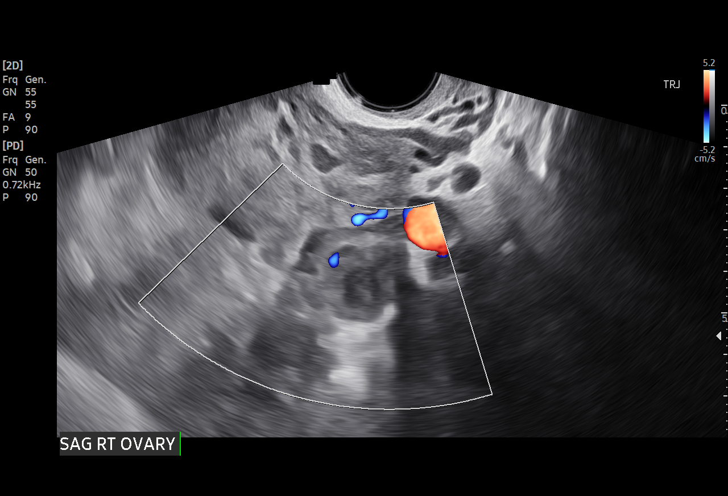

[15 of 28 positions shown; findings below may reference images not displayed]

FINDINGS: Intrauterine gestational sac: Single

Yolk sac:  Not Visualized.

Embryo:  Not visualized

MSD: 35.3 mm   8 w   5 d

Maternal uterus/adnexae:

Subchorionic hemorrhage: None

Right ovary: Normal

Left ovary: Normal with corpus luteum

Other :None

Free fluid:  None
IMPRESSION: 1. There is a single intrauterine gestational sac with a mean sac
diameter of 35.3 mm. No yolk sac or embryo visualized. Findings meet
definitive criteria for failed pregnancy. This follows SRU consensus
guidelines: Diagnostic Criteria for Nonviable Pregnancy Early in the
First Trimester. N Engl J Med 7049;[DATE].

These results will be called to the ordering clinician or
representative by the Radiologist Assistant, and communication
documented in the PACS or [REDACTED].

## 2022-09-11 ENCOUNTER — Ambulatory Visit: Payer: BC Managed Care – PPO | Admitting: Occupational Medicine

## 2022-09-11 VITALS — Temp 97.0°F

## 2022-09-11 DIAGNOSIS — R300 Dysuria: Secondary | ICD-10-CM

## 2022-09-11 LAB — POCT URINALYSIS DIPSTICK OB
Bilirubin, UA: NEGATIVE
Blood, UA: NEGATIVE
Glucose, UA: NEGATIVE
Ketones, UA: NEGATIVE
Leukocytes, UA: NEGATIVE
Nitrite, UA: NEGATIVE
POC,PROTEIN,UA: NEGATIVE
Spec Grav, UA: 1.025 (ref 1.010–1.025)
Urobilinogen, UA: 0.2 E.U./dL
pH, UA: 6 (ref 5.0–8.0)

## 2022-09-11 NOTE — Progress Notes (Signed)
Patient reports burning frequency urgency for 4 days. Hx of UTI. Dipstick done no nitrites or lecoctyes. Push water and take pyridium  OTC for pain. Will send out urine culture. Educated to call PCP to let them know what's going if wants to see.

## 2022-09-13 LAB — URINE CULTURE

## 2022-10-16 ENCOUNTER — Telehealth: Payer: Self-pay | Admitting: Registered Nurse

## 2022-10-16 ENCOUNTER — Encounter: Payer: Self-pay | Admitting: Registered Nurse

## 2022-10-16 ENCOUNTER — Ambulatory Visit: Payer: BC Managed Care – PPO | Admitting: Occupational Medicine

## 2022-10-16 VITALS — BP 125/94 | HR 106 | Temp 97.2°F

## 2022-10-16 DIAGNOSIS — R519 Headache, unspecified: Secondary | ICD-10-CM

## 2022-10-16 DIAGNOSIS — R42 Dizziness and giddiness: Secondary | ICD-10-CM

## 2022-10-16 LAB — GLUCOSE, POCT (MANUAL RESULT ENTRY): POC Glucose: 95 mg/dl (ref 70–99)

## 2022-10-16 NOTE — Progress Notes (Signed)
Patient reports last night has dizziness and lightheaded. This morning worsen. No change when moving from sitting to stand. VSS. Noted tearful. Pt reports didn't eat breakfast BS 95. 0900 Given water and granola bar to eat and rest will recheck in 30 min's. Still feeling same after water and granola. BP 122/103 Complaints of headache at temporal area COVID test done.Covid test negative. Given tylenol 1000 mg for HA. Educated to retest if still not better on Wednesday. Educated if symptoms worsen or not improving to let the clinic know. Educated to follow her normal callouts process just let clinic know. Educated to push fluids this could be start of viral uri.

## 2022-10-16 NOTE — Telephone Encounter (Addendum)
Patient presented to clinic for evaluation with RN Kimrey.  Dizziness had not eaten breakfast had been taking po liquids this am.  Discussed POCT glucose and orthostatic VS.  Positive orthostatic. Discussed eat granola bar and 1 bottle 16 oz water in clinic and rest.  Repeat vital signs.  Push po fluids today and avoid skipping meals.  POCT glucose normal.  Patient disclosed also had headache with RN Kimrey.  Home covid test to be completed as still high circulating flu and covid in community.  Patient works with community in [employer]  Per RN Kimrey patient tolerated granola bar and 1/2 bottle of water but headache not improving.  Afebrile.  Denied n/v/d.  Patient has switched from [employer] to [facility] so not eligible for appt with NP. Patient also said had low iron on last check and finished supplement has not had blood rechecked yet.  Instructed patient to follow up with PCM, discussed with RN Kimrey to review signs of covid/flu with patient as these symptoms may be start of viral URI.  Recovid test in 48 hours if still symptomatic.  Exitcare handouts on dizziness, dehydration, headache,  covid/flu sent to patient my chart account.  Patient with history of UTI and if any dysuria today to POCT dipstick urinalysis for patient also today.

## 2022-10-26 ENCOUNTER — Ambulatory Visit: Payer: BC Managed Care – PPO | Admitting: Occupational Medicine

## 2022-10-26 DIAGNOSIS — R42 Dizziness and giddiness: Secondary | ICD-10-CM

## 2022-10-26 DIAGNOSIS — R519 Headache, unspecified: Secondary | ICD-10-CM

## 2022-10-26 NOTE — Progress Notes (Signed)
Patient reports dizziness and headache resolved no further issues thinks it was virus. Patient asking about odor when bending over. Denies urine symptoms. Encourage to drink more water if continues to contact clinic to do a urine test.

## 2022-10-27 NOTE — Telephone Encounter (Signed)
Patient seen in clinic stated all dizziness resolved feeling well denied concerns.  Respiration even and unlabored RA skin warm dry and pink.  Gait sure and steady in clinic.  A&Ox3 spoke full sentences without difficulty.

## 2022-11-22 ENCOUNTER — Ambulatory Visit: Payer: BC Managed Care – PPO | Admitting: Occupational Medicine

## 2022-11-22 DIAGNOSIS — M25561 Pain in right knee: Secondary | ICD-10-CM

## 2022-11-22 NOTE — Progress Notes (Signed)
Patient reports having right knee pain since yesterday doesn't know when happened didn't know of anything to cause pain no injury. Today patient able to walk on it but painful. Reports 8/10 pain. Noted some swelling to knee. Patient given ice pack to apply on break, Biofreeze to help with pain topical, ibuprofen and tylenol for pain orally. Educated to alternative medications. If doesn't help since on other insurance to go to urgent care. If helped to see me in the am to give more medications and reassess. Will follow up in am. If need to ace wrap or knee brace.

## 2022-11-23 ENCOUNTER — Ambulatory Visit: Payer: BC Managed Care – PPO | Admitting: Occupational Medicine

## 2022-11-23 VITALS — Temp 97.5°F

## 2022-11-23 DIAGNOSIS — R067 Sneezing: Secondary | ICD-10-CM

## 2022-11-23 DIAGNOSIS — J3489 Other specified disorders of nose and nasal sinuses: Secondary | ICD-10-CM

## 2022-11-23 NOTE — Progress Notes (Signed)
Patient reports right knee pain improved since yesterday with tylenol and ibuprofen noted walking better none to mild pain. Swelling to knee decreased. Patient wearing mask due to sneezing nasal drainage. Covid test negative. Patient given Loratadine saline spray cough drops mostly allergies. Educated to contact clinic if worsening.

## 2022-11-24 ENCOUNTER — Ambulatory Visit
Admission: RE | Admit: 2022-11-24 | Discharge: 2022-11-24 | Disposition: A | Discharge status: Home / Self Care | Payer: BC Managed Care – PPO | Source: Ambulatory Visit | Attending: Urgent Care | Admitting: Urgent Care

## 2022-11-24 VITALS — BP 128/87 | HR 95 | Temp 98.9°F | Resp 19

## 2022-11-24 DIAGNOSIS — R0602 Shortness of breath: Secondary | ICD-10-CM | POA: Insufficient documentation

## 2022-11-24 DIAGNOSIS — R059 Cough, unspecified: Secondary | ICD-10-CM | POA: Diagnosis not present

## 2022-11-24 DIAGNOSIS — Z1152 Encounter for screening for COVID-19: Secondary | ICD-10-CM | POA: Insufficient documentation

## 2022-11-24 DIAGNOSIS — R0981 Nasal congestion: Secondary | ICD-10-CM | POA: Insufficient documentation

## 2022-11-24 DIAGNOSIS — R6889 Other general symptoms and signs: Secondary | ICD-10-CM

## 2022-11-24 DIAGNOSIS — R079 Chest pain, unspecified: Secondary | ICD-10-CM | POA: Diagnosis not present

## 2022-11-24 DIAGNOSIS — R519 Headache, unspecified: Secondary | ICD-10-CM | POA: Diagnosis not present

## 2022-11-24 MED ORDER — OSELTAMIVIR PHOSPHATE 75 MG PO CAPS: 75.0000 mg | ORAL_CAPSULE | Freq: Two times a day (BID) | ORAL | 0 refills | Status: DC

## 2022-11-24 NOTE — ED Triage Notes (Signed)
Cough, chest pain with cough, watery eyes, headache, runny nose, body aches, SOB, that started yesterday. Did see a nurse at work and tested her for Covid still waiting on results.

## 2022-11-24 NOTE — Discharge Instructions (Addendum)
You have been diagnosed with a viral upper respiratory infection based on your symptoms and exam. Viral illnesses cannot be treated with antibiotics - they are self limiting - and you should find your symptoms resolving within a few days. Get plenty of rest and non-caffeinated fluids. Watch for signs of dehydration including reduced urine output and dark colored urine.  We have performed a respiratory swab testing for COVID. I have prescribed Tamiflu, antiviral therapy for influenza A, based on a presumptive diagnosis of influenza.  Someone will contact you after results of your swab are available with instructions to continue or stop this medication. If the results of this testing are positive, someone will call you if you are eligible for any antiviral treatment.    We recommend you use over-the-counter medications for symptom control including acetaminophen (Tylenol), ibuprofen (Advil/Motrin) or naproxen (Aleve) for throat pain, fever, chills or body aches. You may combine use of acetaminophen and ibuprofen/naproxen if needed.  Some patients find an pain-relieving throat spray such as Chloraseptic to be effective.  Also recommend cold/cough medication containing a cough suppressant such as dextromethorphan, as needed.  Saline mist spray is helpful for removing excess mucus from your nose.  Room humidifiers are helpful to ease breathing at night. I recommend guaifenesin (Mucinex) with plenty of water throughout the day to help thin and loosen mucus secretions in your respiratory passages.   If appropriate based upon your other medical problems, you might also find relief of nasal/sinus congestion symptoms by using a nasal decongestant such as fluticasone (Flonase ) or pseudoephedrine (Sudafed sinus).  You will need to obtain Sudafed from behind the pharmacist counter.  Speak to the pharmacist to verify that you are not duplicating medications with other over-the-counter formulations that you may be  using.
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
## 2022-11-25 LAB — SARS CORONAVIRUS 2 (TAT 6-24 HRS): SARS Coronavirus 2: NEGATIVE

## 2022-12-02 ENCOUNTER — Ambulatory Visit (HOSPITAL_COMMUNITY): Payer: BC Managed Care – PPO

## 2022-12-11 ENCOUNTER — Ambulatory Visit: Payer: BC Managed Care – PPO

## 2022-12-11 ENCOUNTER — Telehealth: Payer: Self-pay | Admitting: Family Medicine

## 2022-12-11 ENCOUNTER — Encounter: Payer: Self-pay | Admitting: Registered Nurse

## 2022-12-11 ENCOUNTER — Encounter (HOSPITAL_COMMUNITY): Payer: Self-pay | Admitting: Emergency Medicine

## 2022-12-11 ENCOUNTER — Ambulatory Visit (HOSPITAL_COMMUNITY)
Admission: EM | Admit: 2022-12-11 | Discharge: 2022-12-11 | Disposition: A | Discharge status: Home / Self Care | Payer: BC Managed Care – PPO | Attending: Family Medicine | Admitting: Family Medicine

## 2022-12-11 ENCOUNTER — Ambulatory Visit: Payer: BC Managed Care – PPO | Admitting: Occupational Medicine

## 2022-12-11 VITALS — BP 117/86 | HR 73 | Temp 98.0°F

## 2022-12-11 DIAGNOSIS — R300 Dysuria: Secondary | ICD-10-CM

## 2022-12-11 DIAGNOSIS — N3090 Cystitis, unspecified without hematuria: Secondary | ICD-10-CM | POA: Diagnosis not present

## 2022-12-11 DIAGNOSIS — N910 Primary amenorrhea: Secondary | ICD-10-CM

## 2022-12-11 LAB — POCT URINALYSIS DIPSTICK OB
Bilirubin, UA: NEGATIVE
Glucose, UA: NEGATIVE
Ketones, UA: NEGATIVE
POC,PROTEIN,UA: NEGATIVE
Spec Grav, UA: 1.02 (ref 1.010–1.025)
Urobilinogen, UA: 0.2 E.U./dL
pH, UA: 6 (ref 5.0–8.0)

## 2022-12-11 LAB — POC URINE PREG, ED: Preg Test, Ur: NEGATIVE

## 2022-12-11 MED ORDER — NITROFURANTOIN MONOHYD MACRO 100 MG PO CAPS: 100.0000 mg | ORAL_CAPSULE | Freq: Two times a day (BID) | ORAL | 0 refills | Status: DC

## 2022-12-11 MED ORDER — PHENAZOPYRIDINE HCL 100 MG PO TABS: 100.0000 mg | ORAL_TABLET | Freq: Three times a day (TID) | ORAL | 0 refills | Status: DC | PRN

## 2022-12-11 NOTE — ED Triage Notes (Signed)
Pt c/o back pain that started today. Was seen at clinic and dx UTI but since doesn't have the company insurance wasn't able to be prescribed medications and told to go be seen somewhere else.  Pt reports pain in legs as well and vaginal discharge that is yellow in color and thick. Reports that 2 days late on her menstrual cycle and would like a pregnancy test.

## 2022-12-11 NOTE — Discharge Instructions (Addendum)
The pregnancy test is negative here  I think you have a bladder infection.  Take nitrofurantoin 100 mg--1 capsule 2 times daily for 5 days  Take Pyridium 100 mg--1 tablet 3 times daily as needed for urinary pain.  This medication usually makes the urine orange

## 2022-12-11 NOTE — Telephone Encounter (Signed)
Patient would like a nurse to call her back about pregnancy symptoms
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
## 2022-12-11 NOTE — Progress Notes (Addendum)
Patient reports since Thursday Frequency,burning, and odor. Noted today its getting worse. No fever. Positve urine dip. Sending out for culture. NP made aware. Educated since not on [facility] insurance needs to go to uc or pcm. Encourage to drink plenty of water to flush kidneys and can get azo over the counter for the burning.

## 2022-12-12 NOTE — Telephone Encounter (Signed)
Left message for pt if that she continues to have questions or concerns to please give the office a call back.    Frances Nickels  12/12/22

## 2022-12-13 LAB — URINE CULTURE

## 2022-12-18 ENCOUNTER — Telehealth: Payer: Self-pay | Admitting: Emergency Medicine

## 2022-12-18 NOTE — Telephone Encounter (Signed)
Attempted follow up call to patient, LVM

## 2023-02-13 ENCOUNTER — Telehealth: Payer: Self-pay | Admitting: Registered Nurse

## 2023-02-13 ENCOUNTER — Encounter: Payer: Self-pay | Admitting: Registered Nurse

## 2023-02-13 DIAGNOSIS — E785 Hyperlipidemia, unspecified: Secondary | ICD-10-CM

## 2023-02-13 DIAGNOSIS — Z0000 Encounter for general adult medical examination without abnormal findings: Secondary | ICD-10-CM

## 2023-02-13 DIAGNOSIS — E559 Vitamin D deficiency, unspecified: Secondary | ICD-10-CM

## 2023-02-13 DIAGNOSIS — R7401 Elevation of levels of liver transaminase levels: Secondary | ICD-10-CM

## 2023-02-13 NOTE — Telephone Encounter (Signed)
Epic reviewed needs executive panel, hgba1c and vitamin D for Be Well 2025 ordered fasting RN Kimrey notified and patient scheduled for appt.

## 2023-02-14 ENCOUNTER — Other Ambulatory Visit: Payer: BC Managed Care – PPO | Admitting: Occupational Medicine

## 2023-02-14 DIAGNOSIS — Z0000 Encounter for general adult medical examination without abnormal findings: Secondary | ICD-10-CM

## 2023-02-14 NOTE — Progress Notes (Signed)
Lab drawn from Left AC tolerated well no issues noted.   

## 2023-02-15 ENCOUNTER — Encounter: Payer: Self-pay | Admitting: Registered Nurse

## 2023-02-15 ENCOUNTER — Ambulatory Visit: Payer: BC Managed Care – PPO

## 2023-02-15 VITALS — BP 131/92 | HR 82 | Ht 62.0 in | Wt 150.0 lb

## 2023-02-15 DIAGNOSIS — E559 Vitamin D deficiency, unspecified: Secondary | ICD-10-CM | POA: Insufficient documentation

## 2023-02-15 DIAGNOSIS — R7401 Elevation of levels of liver transaminase levels: Secondary | ICD-10-CM | POA: Insufficient documentation

## 2023-02-15 DIAGNOSIS — Z0000 Encounter for general adult medical examination without abnormal findings: Secondary | ICD-10-CM

## 2023-02-15 DIAGNOSIS — E785 Hyperlipidemia, unspecified: Secondary | ICD-10-CM | POA: Insufficient documentation

## 2023-02-15 LAB — CMP12+LP+TP+TSH+6AC+CBC/D/PLT
ALT: 68 IU/L — ABNORMAL HIGH (ref 0–32)
AST: 30 IU/L (ref 0–40)
Albumin/Globulin Ratio: 1.6 (ref 1.2–2.2)
Albumin: 4.9 g/dL (ref 4.0–5.0)
Alkaline Phosphatase: 54 IU/L (ref 44–121)
BUN/Creatinine Ratio: 16 (ref 9–23)
BUN: 11 mg/dL (ref 6–20)
Basophils Absolute: 0.1 10*3/uL (ref 0.0–0.2)
Basos: 1 %
Bilirubin Total: 0.6 mg/dL (ref 0.0–1.2)
Calcium: 9.9 mg/dL (ref 8.7–10.2)
Chloride: 102 mmol/L (ref 96–106)
Chol/HDL Ratio: 6.2 ratio — ABNORMAL HIGH (ref 0.0–4.4)
Cholesterol, Total: 246 mg/dL — ABNORMAL HIGH (ref 100–199)
Creatinine, Ser: 0.68 mg/dL (ref 0.57–1.00)
EOS (ABSOLUTE): 0.7 10*3/uL — ABNORMAL HIGH (ref 0.0–0.4)
Eos: 9 %
Estimated CHD Risk: 1.7 times avg. — ABNORMAL HIGH (ref 0.0–1.0)
Free Thyroxine Index: 1.9 (ref 1.2–4.9)
GGT: 35 IU/L (ref 0–60)
Globulin, Total: 3.1 g/dL (ref 1.5–4.5)
Glucose: 87 mg/dL (ref 70–99)
HDL: 40 mg/dL (ref >39)
Hematocrit: 45.9 % (ref 34.0–46.6)
Hemoglobin: 15 g/dL (ref 11.1–15.9)
Immature Grans (Abs): 0 10*3/uL (ref 0.0–0.1)
Immature Granulocytes: 0 %
Iron: 134 ug/dL (ref 27–159)
LDH: 174 IU/L (ref 119–226)
LDL Chol Calc (NIH): 181 mg/dL — ABNORMAL HIGH (ref 0–99)
Lymphocytes Absolute: 2.7 10*3/uL (ref 0.7–3.1)
Lymphs: 39 %
MCH: 31.6 pg (ref 26.6–33.0)
MCHC: 32.7 g/dL (ref 31.5–35.7)
MCV: 97 fL (ref 79–97)
Monocytes Absolute: 0.3 10*3/uL (ref 0.1–0.9)
Monocytes: 5 %
Neutrophils Absolute: 3.3 10*3/uL (ref 1.4–7.0)
Neutrophils: 46 %
Phosphorus: 3.6 mg/dL (ref 3.0–4.3)
Platelets: 229 10*3/uL (ref 150–450)
Potassium: 4.4 mmol/L (ref 3.5–5.2)
RBC: 4.74 x10E6/uL (ref 3.77–5.28)
RDW: 11.9 % (ref 11.7–15.4)
Sodium: 139 mmol/L (ref 134–144)
T3 Uptake Ratio: 26 % (ref 24–39)
T4, Total: 7.4 ug/dL (ref 4.5–12.0)
TSH: 1.46 u[IU]/mL (ref 0.450–4.500)
Total Protein: 8 g/dL (ref 6.0–8.5)
Triglycerides: 137 mg/dL (ref 0–149)
Uric Acid: 5.5 mg/dL (ref 2.6–6.2)
VLDL Cholesterol Cal: 25 mg/dL (ref 5–40)
WBC: 7.1 10*3/uL (ref 3.4–10.8)
eGFR: 122 mL/min/{1.73_m2} (ref >59)

## 2023-02-15 LAB — VITAMIN D 25 HYDROXY (VIT D DEFICIENCY, FRACTURES): Vit D, 25-Hydroxy: 14.8 ng/mL — ABNORMAL LOW (ref 30.0–100.0)

## 2023-02-15 LAB — HEMOGLOBIN A1C
Est. average glucose Bld gHb Est-mCnc: 114 mg/dL
Hgb A1c MFr Bld: 5.6 % (ref 4.8–5.6)

## 2023-02-15 MED ORDER — CHOLECALCIFEROL 1.25 MG (50000 UT) PO TABS: 1.0000 | ORAL_TABLET | ORAL | 1 refills | Status: AC

## 2023-02-18 NOTE — Telephone Encounter (Signed)
Labs completed 02/13/22 and Be Well paperwork signed 02/15/23 see epic results note

## 2023-02-21 ENCOUNTER — Ambulatory Visit: Payer: BC Managed Care – PPO | Admitting: Occupational Medicine

## 2023-02-21 VITALS — BP 130/89

## 2023-02-21 DIAGNOSIS — E785 Hyperlipidemia, unspecified: Secondary | ICD-10-CM

## 2023-02-21 DIAGNOSIS — Z0000 Encounter for general adult medical examination without abnormal findings: Secondary | ICD-10-CM

## 2023-02-21 DIAGNOSIS — R7401 Elevation of levels of liver transaminase levels: Secondary | ICD-10-CM

## 2023-02-21 DIAGNOSIS — E559 Vitamin D deficiency, unspecified: Secondary | ICD-10-CM

## 2023-02-21 NOTE — Progress Notes (Signed)
Be well insurance premium discount evaluation: Not met working on alternative, scheduled follow BP checks emailed dietitian and discuss dietary changes for A1c and cholesterol.    Epic reviewed by RN Kimrey transcribed labs and reviewed with the patient. Scheduled follow up labs.   Tobacco attestation signed. [facility] ROI formed signed. Forms placed in the chart.   Patient given handouts for [facility] pharmacies and discount drugs list, MyChart, [facility], [facility], [facility] counseling and [employer] counseling.  What to do for infectious illness protocol. Given handout for list of medications that can be filled at [facility]. Given Clinic hours and Clinic Email.

## 2023-02-27 ENCOUNTER — Ambulatory Visit: Payer: BC Managed Care – PPO | Admitting: Occupational Medicine

## 2023-02-27 VITALS — BP 143/87 | HR 90

## 2023-02-27 DIAGNOSIS — R030 Elevated blood-pressure reading, without diagnosis of hypertension: Secondary | ICD-10-CM

## 2023-03-08 ENCOUNTER — Other Ambulatory Visit: Payer: BC Managed Care – PPO | Admitting: Occupational Medicine

## 2023-03-08 NOTE — Progress Notes (Signed)
Constipation encouraged water fiber intake chia seeds and miralax as needed. Following up next week.

## 2023-03-11 NOTE — Progress Notes (Signed)
Patient reviewed results per epic/noted

## 2023-03-15 ENCOUNTER — Ambulatory Visit: Payer: BC Managed Care – PPO | Admitting: Occupational Medicine

## 2023-03-15 VITALS — BP 119/84 | HR 100

## 2023-03-15 DIAGNOSIS — Z0130 Encounter for examination of blood pressure without abnormal findings: Secondary | ICD-10-CM

## 2023-03-15 DIAGNOSIS — Z719 Counseling, unspecified: Secondary | ICD-10-CM

## 2023-03-15 NOTE — Progress Notes (Signed)
Be well insurance premium discount evaluation: Met  Completed alternative BP with in range.  Scheduled follow BP check in 2 months with next lab draw. Dicussed dietitian dietary changes for A1c and cholesterol.

## 2023-04-02 ENCOUNTER — Ambulatory Visit: Payer: BC Managed Care – PPO | Admitting: Occupational Medicine

## 2023-04-02 VITALS — BP 120/88 | HR 85 | Temp 98.0°F

## 2023-04-02 DIAGNOSIS — M542 Cervicalgia: Secondary | ICD-10-CM

## 2023-04-02 DIAGNOSIS — M25561 Pain in right knee: Secondary | ICD-10-CM

## 2023-04-02 DIAGNOSIS — R8290 Unspecified abnormal findings in urine: Secondary | ICD-10-CM

## 2023-04-02 LAB — POCT URINALYSIS DIPSTICK
Bilirubin, UA: NEGATIVE
Glucose, UA: NEGATIVE
Ketones, UA: NEGATIVE
Nitrite, UA: NEGATIVE
Protein, UA: POSITIVE — AB
Spec Grav, UA: 1.025 (ref 1.010–1.025)
Urobilinogen, UA: 0.2 E.U./dL
pH, UA: 6.5 (ref 5.0–8.0)

## 2023-04-02 NOTE — Progress Notes (Signed)
Right side of neck pain for like 1.5 week hurts to move neck. Pain 5-6/10. No lymph nodes swollen. Neck muscle feel tight. Throat is WNL no pain swallowing. Right knee pain is 7/10 when bending sitting or walking a long time. Patient states ibuprofen not helping. Encourage her to use Tylenol Q 4 hrs and Ibuprofen Q 6 hrs for pain. Patient given Biofreeze to applied to knee. Educated when not on it to elevate and try ice. Patient reports brace previously but didn't help. Patient reports still having strong urine smell. Collect urine sample sent for culture. NP made aware and scheduling appt for patient in am.

## 2023-04-03 ENCOUNTER — Encounter: Payer: Self-pay | Admitting: Registered Nurse

## 2023-04-03 ENCOUNTER — Ambulatory Visit: Payer: Self-pay | Admitting: Registered Nurse

## 2023-04-03 VITALS — BP 124/88 | HR 93 | Temp 99.1°F | Resp 16

## 2023-04-03 DIAGNOSIS — J039 Acute tonsillitis, unspecified: Secondary | ICD-10-CM

## 2023-04-03 DIAGNOSIS — R8290 Unspecified abnormal findings in urine: Secondary | ICD-10-CM

## 2023-04-03 DIAGNOSIS — M7651 Patellar tendinitis, right knee: Secondary | ICD-10-CM

## 2023-04-03 NOTE — Progress Notes (Signed)
Covid negative
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
## 2023-04-03 NOTE — Patient Instructions (Addendum)
Patellar Tendinitis Rehab Ask your health care provider which exercises are safe for you. Do exercises exactly as told by your health care provider and adjust them as directed. It is normal to feel mild stretching, pulling, tightness, or discomfort as you do these exercises. Stop right away if you feel sudden pain or your pain gets worse. Do not begin these exercises until told by your health care provider. Stretching and range-of-motion exercise This exercise warms up your muscles and joints and improves the movement and flexibility of your knee. The exercise also helps to relieve pain and stiffness. Hamstring, doorway stretch This is an exercise in which you lie in a doorway and prop your leg on a wall to stretch the back of your knee and thigh (hamstring). Lie on your back in front of a doorway with your left / right leg resting against the wall and your other leg flat on the floor in the doorway. There should be a slight bend in your left / right knee. Straighten your left / right knee. You should feel a stretch behind your knee or thigh. If you do not, scoot your buttocks closer to the door. Hold this position for ___30_______ seconds. Repeat ___3_______ times. Complete this exercise ____2______ times a day. Strengthening exercises These exercises build strength and endurance in your knee. Endurance is the ability to use your muscles for a long time, even after they get tired. Quadriceps, isometric This exercise stretches the muscles in front of your thigh (quadriceps) without moving your knee joint (isometric). Lie on your back with your left / right leg extended and your other knee bent. Slowly tense the muscles in the front of your left / right thigh. When you do this, you should see your kneecap slide up toward your hip or see increased dimpling just above the knee. This motion will push the back of your knee toward the floor. If this is painful, try putting a rolled-up hand towel under your  knee to support it in a bent position. Change the size of the towel to find a position that allows you to do this exercise without any pain. For ____30______ seconds, hold the muscle as tight as you can without increasing your pain. Relax the muscles slowly and completely. Repeat ___3_______ times. Complete this exercise ____2______ times a day. Straight leg raises, flexors This exercise stretches the muscles in front of your thigh (quadriceps) and the muscles that move your hips (hip flexors). Lie on your back with your left / right leg extended and your other knee bent. Tense the muscles in the front of your left / right thigh. When you do this, you should see your kneecap slide up or see increased dimpling just above the knee. Keep these muscles tight as you raise your leg 4-6 inches (10-15 cm) off the floor. Do not let your moving knee bend. Hold this position for _____30_____ seconds. Keep these muscles tense as you slowly lower your leg. Relax your muscles slowly and completely. Repeat ____3______ times. Complete this exercise _____2_____ times a day. Squats avoid for 2 weeks This is a weight-bearing exercise in which you bend your knees and lower your hips while engaging your thigh muscles. Stand in front of a table, with your feet and knees pointing straight ahead. You may rest your hands on the table for balance but not for support. Slowly bend your knees and lower your hips like you are going to sit in a chair. Keep your weight over your heels,  not over your toes. Keep your lower legs upright so they are parallel with the table legs. Do not let your hips go lower than your knees. Do not bend lower than told by your health care provider. If your knee pain increases, do not bend as low. Hold the squat position for ____30______ seconds. Slowly push with your legs to return to standing. Do not use your hands to pull yourself to standing. Repeat ___3_______ times. Complete this exercise  ______2____ times a day. Step-downs This is an exercise in which you step down slowly while engaging your leg muscles. Stand on the edge of a step. Keeping your weight over your left / right heel, slowly bend your left / right knee to bring your left / right heel toward the floor. Lower your heel as far as you can while keeping control and without increasing any discomfort. Do not let your left / right knee come forward. Use your leg muscles, not gravity, to lower your body. Hold on to a wall or rail for balance if needed. Slowly push through your heel to lift your body weight back up. Return to the starting position. Repeat ____3______ times. Complete this exercise _____2_____ times a day. Straight leg raises, abductors This exercise strengthens the muscles that rotate the leg at the hip and move it away from your body (hip abductors). Lie on your side with your left / right leg in the top position. Lie so your head, shoulder, knee, and hip line up. You may bend your bottom knee to help you keep your balance. Roll your hips slightly forward, so that your hips are stacked directly over each other and your left / right knee is facing forward. Leading with your heel, lift your top leg 4-6 inches (10-15 cm). You should feel the muscles in your outer hip lifting. Do not let your foot drift forward. Do not let your knee roll toward the ceiling. Hold this position for __30________ seconds. Slowly lower your leg to the starting position. Let your muscles relax completely after each repetition. Repeat ____3______ times. Complete this exercise ___2_______ times a day. This information is not intended to replace advice given to you by your health care provider. Make sure you discuss any questions you have with your health care provider. Document Revised: 04/19/2021 Document Reviewed: 04/19/2021 [employer] Patient Education  2024 [employer]Dysuria Dysuria is pain or discomfort during urination. The  pain or discomfort may be felt in the part of the body that drains urine from the bladder (urethra) or in the surrounding tissue of the genitals. The pain may also be felt in the groin area, lower abdomen, or lower back. You may have to urinate frequently or have the sudden feeling that you have to urinate (urgency). Dysuria can affect anyone, but it is more common in females. Dysuria can be caused by many different things, including: Urinary tract infection. Kidney stones or bladder stones. Certain STIs (sexually transmitted infections), such as chlamydia. Dehydration. Inflammation of the tissues of the vagina. Use of certain medicines. Use of certain soaps or scented products that cause irritation. Follow these instructions at home: Medicines Take over-the-counter and prescription medicines only as told by your health care provider. If you were prescribed an antibiotic medicine, take it as told by your health care provider. Do not stop taking the antibiotic even if you start to feel better. Eating and drinking  Drink enough fluid to keep your urine pale yellow. Avoid caffeinated beverages, tea, and alcohol. These beverages  can irritate the bladder and make dysuria worse. In males, alcohol may irritate the prostate. General instructions Watch your condition for any changes. Urinate often. Avoid holding urine for long periods of time. If you are female, you should wipe from front to back after urinating or having a bowel movement. Use each piece of toilet paper only once. Empty your bladder after sex. Keep all follow-up visits. This is important. If you had any tests done to find the cause of dysuria, it is up to you to get your test results. Ask your health care provider, or the department that is doing the test, when your results will be ready. Contact a health care provider if: You have a fever. You develop pain in your back or sides. You have nausea or vomiting. You have blood in your  urine. You are not urinating as often as you usually do. Get help right away if: Your pain is severe and not relieved with medicines. You cannot eat or drink without vomiting. You are confused. You have a rapid heartbeat while resting. You have shaking or chills. You feel extremely weak. Summary Dysuria is pain or discomfort while urinating. Many different conditions can lead to dysuria. If you have dysuria, you may have to urinate frequently or have the sudden feeling that you have to urinate (urgency). Watch your condition for any changes. Keep all follow-up visits. Make sure that you urinate often and drink enough fluid to keep your urine pale yellow. This information is not intended to replace advice given to you by your health care provider. Make sure you discuss any questions you have with your health care provider. Document Revised: 04/23/2020 Document Reviewed: 04/23/2020 [employer] Patient Education  2024 [employer] Tonsillitis  Tonsillitis is an infection of the throat that causes the tonsils to become red, tender, and swollen. Tonsils are tissues in the back of your throat. Each tonsil has crevices (crypts). Tonsils normally work to protect the body from infection. What are the causes? Sudden (acute) tonsillitis may be caused by a virus or bacteria, including streptococcal bacteria. Long-lasting (chronic) tonsillitis occurs when the crypts of the tonsils become filled with pieces of food and bacteria, which makes it easy for the tonsils to become repeatedly infected. Tonsillitis can be spread from person to person when it is caused by a virus or bacteria. It may be spread by inhaling droplets that are released with coughing or sneezing. You may also come into contact with viruses or bacteria on surfaces, such as cups or utensils. What are the signs or symptoms? Symptoms of this condition include: A sore throat. This may include trouble swallowing. White patches on the  tonsils. Swollen tonsils. Fever. Headache. Tiredness. Loss of appetite. Snoring during sleep when you did not snore before. Small, foul-smelling, yellowish-white pieces of material (tonsilloliths) that you occasionally cough up or spit out. These can cause you to have bad breath. How is this diagnosed? This condition is diagnosed with a physical exam. Diagnosis can be confirmed with the results of lab tests, including a throat culture. How is this treated? Treatment for this condition depends on the cause, but usually focuses on treating the symptoms associated with it. Treatment may include: Medicines to relieve pain and manage fever. Steroid medicines to reduce swelling. Antibiotic medicines if the condition is caused by bacteria. If episodes of tonsillitis are severe and frequent, your health care provider may recommend surgery to remove the tonsils (tonsillectomy). Follow these instructions at home: Medicines Take over-the-counter and prescription  medicines only as told by your health care provider. If you were prescribed an antibiotic medicine, take it as told by your health care provider. Do not stop taking the antibiotic even if you start to feel better. Eating and drinking Drink enough fluid to keep your urine pale yellow. While your throat is sore, eat soft or liquid foods, such as sherbet, soups, or soft, warm cereals, such as oatmeal or hot wheat cereal. Drink warm liquids. Eat frozen ice pops. General instructions Rest as much as possible and get plenty of sleep. Gargle with a mixture of salt and water 3-4 times a day or as needed. To make salt water, completely dissolve -1 tsp (3-6 g) of salt in 1 cup (237 mL) of warm water. Do not swallow the mixture of salt and water. Wash your hands regularly with soap and water for at least 20 seconds. If soap and water are not available, use hand sanitizer. Do not share cups, bottles, or other utensils until your symptoms have gone  away. Do not use any products that contain nicotine or tobacco. These products include cigarettes, chewing tobacco, and vaping devices, such as e-cigarettes. If you need help quitting, ask your health care provider. Keep all follow-up visits. This is important. Contact a health care provider if: You notice large, tender lumps in your neck that were not there before. You have a fever that does not go away after 2-3 days. You develop a rash. You cough up a green, yellow-brown, or bloody substance. You cannot swallow liquids or food for 24 hours. Only one of your tonsils is swollen. Get help right away if: You develop any new symptoms, such as vomiting, severe headache, stiff neck, chest pain, trouble breathing, or trouble swallowing. You have severe throat pain along with drooling or voice changes. You have severe pain that is not controlled with medicines. You cannot fully open your mouth. You develop redness, swelling, or severe pain anywhere in your neck. Summary Tonsillitis is an infection of the throat that causes the tonsils to become red, tender, and swollen. The most common symptom is pain in the throat. Tonsillitis is most often caused by a virus or bacteria. Get help right away if you develop any new symptoms, such as vomiting, severe headache, stiff neck, chest pain, or trouble breathing. This information is not intended to replace advice given to you by your health care provider. Make sure you discuss any questions you have with your health care provider. Document Revised: 02/03/2021 Document Reviewed: 02/03/2021 Else[employer] Patient Education  2024 [employer] Patellar Tendinitis  Patellar tendinitis is also called jumper's knee or patellar tendinopathy. This condition happens when there is damage to the patellar tendon. Tendons are cord-like tissues that connect muscles to bones. The patellar tendon connects the bottom of the kneecap (patella) to the top of the shin bone  (tibia). Patellar tendinitis causes pain in the front of the knee. The condition is classified into the following stages: Stage 1: You have pain only after activity. Stage 2: You have pain during and after activity. Stage 3: You have pain at rest as well as during and after activity. The pain limits your ability to do the activity. Stage 4: You have tendon tears. The tears severely limit your activity. What are the causes? This condition is caused by repeated (repetitive) stress on the tendon. This stress may cause the tendon to stretch, swell, thicken, or tear. What increases the risk? The following factors may make you more likely to  develop this condition: Participating in sports that involve running, kicking, and jumping, especially on hard surfaces. These include: Basketball. Volleyball. Soccer. Track and field. Training too hard. Having tight thigh muscles. Having received steroid injections in the tendon. Having had knee surgery. Being 75-66 years old. Having rheumatoid arthritis, diabetes, or kidney disease. These conditions interrupt blood flow to the knee, causing the tendon to weaken. What are the signs or symptoms? The main symptom of this condition is pain and swelling in the front of the knee. The pain usually starts slowly and gradually gets worse. It may become painful to straighten your leg. The pain may get worse when you walk, run, or jump. How is this diagnosed? This condition may be diagnosed based on: Your symptoms. Your medical history. A physical exam. During the physical exam, your health care provider may check for: Tenderness along the tendon just below the patella. Tightness in your thigh muscles. Pain when you straighten your knee. Imaging tests, including: X-rays. These will show the position and condition of your patella. An MRI. This will show any abnormality of the tendon. Ultrasound. This will show any swelling or other abnormalities of the  tendon. How is this treated? Treatment for this condition depends on the stage of the condition. It may involve: Avoiding activities that cause pain, such as jumping. Icing and elevating your knee. Having sound wave stimulation to promote healing. Doing physical therapy exercises to improve movement and strength in your knee when pain and swelling improve. Wearing a knee brace. This may be needed if your condition does not improve with treatment. Using crutches or a walker. This may be needed if your condition does not improve with treatment. Surgery. This may be done if you have stage 4 tendinitis. Follow these instructions at home: If you have a removable brace: Wear the brace as told by your health care provider. Remove it only as told by your health care provider. Check the skin around the brace every day. Tell your health care provider about any concerns. Loosen the brace if your toes tingle, become numb, or turn cold and blue. Keep the brace clean. If the brace is not waterproof: Do not let it get wet. Cover it with a watertight covering when you take a bath or shower. Ask your health care provider when it is safe for you to drive. Managing pain, stiffness, and swelling  If directed, put ice on the injured area. To do this: If you have a removable brace, remove it as told by your health care provider. Put ice in a plastic bag. Place a towel between your skin and the bag. Leave the ice on for 20 minutes, 2-3 times a day. Remove the ice if your skin turns bright red. This is very important. If you cannot feel pain, heat, or cold, you have a greater risk of damage to the area. Move your toes often to reduce stiffness and swelling. Raise (elevate) your knee above the level of your heart while you are sitting or lying down. Activity Do not use the injured limb to support your body weight until your health care provider says that you can. Use your crutches or a walker as told by your  health care provider. Do exercises as told by your health care provider or physical therapist. Return to your normal activities as told by your health care provider. Ask your health care provider what activities are safe for you. General instructions Take over-the-counter and prescription medicines only as told by  your health care provider. Do not use any products that contain nicotine or tobacco. These products include cigarettes, chewing tobacco, and vaping devices, such as e-cigarettes. These can delay healing. If you need help quitting, ask your health care provider. Keep all follow-up visits. This is important. How is this prevented? Warm up and stretch before being active. Cool down and stretch after being active. Give your body time to rest between periods of activity. You may need to reduce how often you play a sport that requires frequent jumping. Make sure to use equipment that fits you. Be safe and responsible while being active. This will help you avoid falls which can damage the tendon. Do at least 150 minutes of moderate-intensity exercise each week, such as brisk walking or water aerobics. Maintain physical fitness, including: Strength. Flexibility. Cardiovascular fitness. Endurance. Contact a health care provider if: Your symptoms have not improved in 6 weeks. Your symptoms get worse. Summary Patellar tendinitis is also called jumper's knee or patellar tendinopathy. This condition happens when there is damage to the patellar tendon. Treatment for this condition depends on the stage of the condition and may include rest, ice, exercises, a knee brace, and surgery. Do not use the injured limb to support your body weight until your health care provider says that you can. Take over-the-counter and prescription medicines only as told by your health care provider. This information is not intended to replace advice given to you by your health care provider. Make sure you discuss  any questions you have with your health care provider. Document Revised: 04/19/2021 Document Reviewed: 04/19/2021 [employer] Patient Education  2024 [employer].

## 2023-04-04 ENCOUNTER — Other Ambulatory Visit: Payer: Self-pay | Admitting: Registered Nurse

## 2023-04-04 ENCOUNTER — Encounter: Payer: Self-pay | Admitting: Registered Nurse

## 2023-04-04 DIAGNOSIS — N390 Urinary tract infection, site not specified: Secondary | ICD-10-CM

## 2023-04-04 LAB — URINE CULTURE

## 2023-04-04 MED ORDER — NITROFURANTOIN MONOHYD MACRO 100 MG PO CAPS: 100.0000 mg | ORAL_CAPSULE | Freq: Two times a day (BID) | ORAL | 0 refills | Status: AC

## 2023-04-05 ENCOUNTER — Encounter: Payer: Self-pay | Admitting: Registered Nurse

## 2023-04-05 ENCOUNTER — Ambulatory Visit: Payer: BC Managed Care – PPO | Admitting: Registered Nurse

## 2023-04-05 VITALS — Resp 16

## 2023-04-05 NOTE — Progress Notes (Signed)
Subjective:    Patient ID: Alexis Gordon, female    DOB: 12-Aug-1996, 27 y.o.   MRN: [id number]  27y/o hispanic married female established patient here for re-evaluation sore neck, right knee pain and smelly urine/urine culture results.  Stated she received my my chart message yesterday and picked up nitrofurantoin rx yesterday and took first dose this am.  Knee at times feels better wearing neoprene sleeve but still having some pain.  Denied swelling/locking/giving out; reported she has to stand/bend/squat at work to do her job duties. Intermittent pain with activity.  Has new puppy frenchie at home also.  Denied n/v/d/f/c/rash/bruising/headache.  Doing salt water gargles and taking tylenol/motrin neck feels better denied white spots back of throat/dysphagia/dysphasia.      Review of Systems  Constitutional:  Positive for fatigue. Negative for chills and fever.  HENT:  Negative for congestion, drooling, ear discharge, ear pain, facial swelling, mouth sores, postnasal drip, rhinorrhea, sinus pressure, sinus pain, sneezing, sore throat, trouble swallowing and voice change.   Eyes:  Negative for photophobia and visual disturbance.  Respiratory:  Negative for cough.   Gastrointestinal:  Negative for diarrhea and vomiting.  Genitourinary:  Negative for difficulty urinating.  Musculoskeletal:  Positive for arthralgias and myalgias. Negative for back pain, gait problem, joint swelling and neck stiffness.  Skin:  Negative for rash.  Neurological:  Negative for dizziness, tremors, syncope, facial asymmetry, speech difficulty, weakness, light-headedness and headaches.  Hematological:  Negative for adenopathy.  Psychiatric/Behavioral:  Negative for agitation, confusion and sleep disturbance.        Objective:   Physical Exam Vitals reviewed.  Constitutional:      General: She is awake. She is not in acute distress.    Appearance: Normal appearance. She is well-developed and well-groomed.  She is not ill-appearing, toxic-appearing or diaphoretic.  HENT:     Head: Normocephalic and atraumatic.     Jaw: There is normal jaw occlusion. No trismus.     Salivary Glands: Right salivary gland is not diffusely enlarged. Left salivary gland is not diffusely enlarged.     Right Ear: Hearing, ear canal and external ear normal.     Left Ear: Hearing, ear canal and external ear normal.     Nose: No nasal deformity, septal deviation, laceration, mucosal edema, congestion or rhinorrhea.     Right Turbinates: Not enlarged, swollen or pale.     Left Turbinates: Not enlarged, swollen or pale.     Right Sinus: No maxillary sinus tenderness or frontal sinus tenderness.     Left Sinus: No maxillary sinus tenderness or frontal sinus tenderness.     Mouth/Throat:     Lips: Pink. No lesions.     Mouth: Mucous membranes are moist. Mucous membranes are not pale, not dry and not cyanotic. No lacerations, oral lesions or angioedema.     Dentition: No dental abscesses or gum lesions.     Tongue: No lesions. Tongue does not deviate from midline.     Palate: No mass and lesions.     Pharynx: Uvula midline. Pharyngeal swelling and posterior oropharyngeal erythema present. No oropharyngeal exudate, uvula swelling or postnasal drip.     Tonsils: No tonsillar exudate or tonsillar abscesses. 2+ on the right. 2+ on the left.     Comments: Bilateral tonsils edema erythema macular no exudate 2+/4 spoke full sentences without difficulty normal voice; no congestion/throat clearing/cough observed in clinic Eyes:     General: Lids are normal. Vision grossly intact. Gaze aligned  appropriately. Allergic shiner present. No scleral icterus.       Right eye: No foreign body, discharge or hordeolum.        Left eye: No foreign body, discharge or hordeolum.     Extraocular Movements:     Right eye: Normal extraocular motion and no nystagmus.     Left eye: Normal extraocular motion and no nystagmus.     Conjunctiva/sclera:  Conjunctivae normal.     Right eye: Right conjunctiva is not injected. No chemosis, exudate or hemorrhage.    Left eye: Left conjunctiva is not injected. No chemosis, exudate or hemorrhage.    Pupils: Pupils are equal, round, and reactive to light. Pupils are equal.     Right eye: Pupil is round and reactive.     Left eye: Pupil is round and reactive.  Neck:     Thyroid: No thyroid mass or thyromegaly.     Trachea: Trachea and phonation normal. No tracheal tenderness or tracheal deviation.     Comments: Less fullness/tenderness with palpation right lateral neck today "doesn't hurt today" per patient on palpation Cardiovascular:     Pulses:          Radial pulses are 2+ on the right side and 2+ on the left side.  Pulmonary:     Effort: Pulmonary effort is normal. No accessory muscle usage or respiratory distress.     Breath sounds: Normal breath sounds and air entry. No stridor. No decreased breath sounds, wheezing, rhonchi or rales.     Comments: Spoke full sentences without difficulty Chest:     Chest wall: No tenderness.  Abdominal:     General: There is no distension.     Palpations: Abdomen is soft.  Musculoskeletal:        General: No swelling or deformity. Normal range of motion.     Right hand: Normal strength. Normal capillary refill.     Left hand: Normal strength. Normal capillary refill.     Cervical back: Normal range of motion and neck supple. No swelling, edema, deformity, erythema, signs of trauma, lacerations, rigidity, spasms, torticollis, tenderness or crepitus. No pain with movement. Normal range of motion.     Right hip: Normal.     Left hip: Normal.     Right knee: Normal.     Left knee: Normal.     Right lower leg: No edema.     Left lower leg: No edema.  Lymphadenopathy:     Head:     Right side of head: No submental, submandibular, tonsillar, preauricular, posterior auricular or occipital adenopathy.     Left side of head: No submental, submandibular,  tonsillar, preauricular, posterior auricular or occipital adenopathy.     Cervical: No cervical adenopathy.     Right cervical: No superficial, deep or posterior cervical adenopathy.    Left cervical: No superficial, deep or posterior cervical adenopathy.  Skin:    General: Skin is warm and dry.     Capillary Refill: Capillary refill takes less than 2 seconds.     Coloration: Skin is not ashen, cyanotic, jaundiced, mottled, pale or sallow.     Findings: No abrasion, abscess, acne, bruising, burn, ecchymosis, erythema, signs of injury, laceration, lesion, petechiae, rash or wound.     Nails: There is no clubbing.  Neurological:     General: No focal deficit present.     Mental Status: She is alert and oriented to person, place, and time. Mental status is at baseline. She is not  disoriented.     GCS: GCS eye subscore is 4. GCS verbal subscore is 5. GCS motor subscore is 6.     Cranial Nerves: No cranial nerve deficit.     Sensory: No sensory deficit.     Motor: Motor function is intact. No weakness, tremor, atrophy, abnormal muscle tone or seizure activity.     Coordination: Coordination is intact. Coordination normal.     Gait: Gait is intact. Gait normal.     Comments: Gait sure and stead in clinic; bilateral hand grasp equal 5/5  Psychiatric:        Attention and Perception: Attention and perception normal.        Mood and Affect: Mood and affect normal.        Speech: Speech normal.        Behavior: Behavior normal. Behavior is cooperative.        Thought Content: Thought content normal.        Cognition and Memory: Cognition and memory normal.        Judgment: Judgment normal.           Assessment & Plan:   A-acute UTI with microscopic hematuria; acute tonsillitis, acute right knee pain  P-Discussed with patient e coli grew out of urine/UTI.  Finish nitrofurantoin 100mg  po bid x 7 days and notify clinic if smelly urine does not resolve  Continue increased water intake as  heat index over 100 again today.  Urinate every 2-4 hours when awake. Patient agreed with plan of care and had no further questions at this time.  Continue nasal saline, flonase nasal, tylenol and/or motrin prn pain/plan of care as discussed 04/03/23.  Discussed most likely viral illness.  Antibiotics do not help with viral illness.  Notify NP if white spots back of throat, trouble swallowing, fever or new symptoms.  Continue salt water gargles prn also.  Patient agreed with plan of care and had no further questions at this time.  Continue neoprene sleeve, rest, leg elevation when sitting, ice prn, epsom salt bath prn and gentle arom daily.  Follow up in 2 weeks for re-evaluation. If she needs work restrictions will need to follow up with [facility] for work note as unable to give work note restrictions in this clinic due to contract limitations.   Discussed will take a few weeks for tendonitis to resolve does not happen overnight inflammation has to resolve and microtears heal.  Exitcare handout knee pain.  Patient verbalized understanding information/instructions, agreed with plan of care and had no further questions at this time.

## 2023-04-05 NOTE — Patient Instructions (Signed)
Knee Sprain, Adult  A knee sprain is a stretch or tear in a knee ligament. Knee ligaments are tissues that connect the bones of the knee joint to each other. What are the causes? This condition often results from: A fall. An injury to the knee. What are the signs or symptoms? Symptoms of this condition include: Trouble straightening or bending the leg. Swelling in the knee. Bruising around the knee. Tenderness or pain in the knee. Sudden muscle tightening (spasm) around the knee. How is this diagnosed? This condition may be diagnosed based on: A physical exam. A history of what happened just before you started to have symptoms. Tests. These may include: An X-ray. This may be done to make sure no bones are broken or moved out of position (dislocated). An MRI. This may be done to check if any of the ligaments are torn and to check for other damage. A physical exam that includes stress testing of the knee. This may be done to check for damage to ligaments. How is this treated? Treatment for this condition may involve: Keeping the knee in a straightened position (immobilized) with a cast, brace, or splint. Applying ice to the knee. This helps with pain and swelling. Raising (elevating) the knee above the level of your heart when you are resting. This helps with pain and swelling. Taking medicine for pain. Doing exercises to prevent or limit long-term weakness or stiffness in your knee. Having surgery to reconnect ligaments to the bone or to reconstruct ligaments. This may be needed if one or more ligaments are fully torn. Follow these instructions at home: If you have a removable splint or brace: Wear the splint or brace as told by your health care provider. Remove it only as told by your health care provider. Check the skin around the splint or brace every day. Tell your health care provider about any concerns. Loosen the splint or brace if your toes tingle, become numb, or turn cold  and blue. Keep the splint or brace clean and dry. If you have a nonremovable cast: Do not put pressure on any part of the cast until it is fully hardened. This may take several hours. Do not stick anything inside the cast to scratch your skin. Doing that increases your risk of infection. Check the skin around the cast every day. Tell your health care provider about any concerns. You may put lotion on dry skin around the edges of the cast. Do not put lotion on the skin underneath the cast. Keep the cast clean and dry. Bathing If the splint, brace, or cast is not waterproof: Do not let it get wet. Cover it with a watertight covering when you take a bath or a shower. Managing pain, stiffness, and swelling  If directed, put ice on the injured area. To do this: If you have a removable splint or brace, remove it as told by your health care provider. Put ice in a plastic bag. Place a towel between your skin and the bag or between your cast and the bag. Leave the ice on for 20 minutes, 2-3 times a day. If your skin turns bright red, remove the ice right away to prevent skin damage. The risk of skin damage is higher if you cannot feel pain, heat, or cold. Move your toes often to reduce stiffness and swelling. Elevate the injured area above the level of your heart while you are sitting or lying down. General instructions Take over-the-counter and prescription medicines only as  told by your health care provider. Do not use any products that contain nicotine or tobacco. These products include cigarettes, chewing tobacco, and vaping devices, such as e-cigarettes. These can delay healing. If you need help quitting, ask your health care provider. Do exercises as told by your health care provider. Contact a health care provider if: You have pain that gets worse. The cast, brace, or splint does not fit right or gets damaged. Get help right away if: You cannot use your injured knee to support any of your  body weight (cannot bear weight). You cannot move the injured joint. You cannot walk more than a few steps without pain or without your knee buckling. You have a lot of pain, swelling, or numbness in the leg below the cast, brace, or splint. Your foot or toes are numb, cold, or blue after you loosen your splint or brace. This information is not intended to replace advice given to you by your health care provider. Make sure you discuss any questions you have with your health care provider. Document Revised: 03/06/2022 Document Reviewed: 03/06/2022 Elsevier Patient Education  2024 [employer].

## 2023-04-20 NOTE — Progress Notes (Signed)
See epic results received for CBC 02/14/23 from [facility] normal except slightly elevated eosinophils.  Repeat in 1 year with [facility] labs.  Patient reviewed results electronically 02/15/23 per epic notification.  Patient met with RN Bess Kinds 02/21/23 and reviewed results with her/given printed copy of results and instructions.  Repeat vitamin D level cancelled due to new national guidelines.  Once she completes 50,000 units per week by mouth prescription to start 2000 units by mouth daily with meal over the counter.  My chart message sent to patient  LFT recheck still 05/16/23 and lipids 08/15/23 appts scheduled pending draws with RN Janeece Riggers,  Due to new national guidelines I won't be rechecking your vitamin D level.  After you finish the 50,000 units by mouth weekly vitamin D prescription start cholecalciferol 2000 units daily with meal over the counter year round.  Please keep your appts 05/16/23 to recheck liver function and 08/15/23 for cholesterol.  Please let me know if you have further questions or concerns.  Sincerely,  Albina Billet NP-C

## 2023-04-26 NOTE — Progress Notes (Signed)
Patient seen in [facility]center today stated all symptoms resolved and finished antibiotic

## 2023-05-07 ENCOUNTER — Telehealth: Payer: Self-pay | Admitting: Registered Nurse

## 2023-05-07 ENCOUNTER — Encounter: Payer: Self-pay | Admitting: Registered Nurse

## 2023-05-07 DIAGNOSIS — E559 Vitamin D deficiency, unspecified: Secondary | ICD-10-CM

## 2023-05-07 DIAGNOSIS — L308 Other specified dermatitis: Secondary | ICD-10-CM

## 2023-05-07 MED ORDER — VITAMIN D3 50 MCG (2000 UT) PO CAPS: 2000.0000 [IU] | ORAL_CAPSULE | Freq: Every day | ORAL | 3 refills | Status: AC

## 2023-05-07 NOTE — Telephone Encounter (Signed)
Patient notified she was to take 50,000 units by mouth weekly with meal cholecalciferol x 24 doses then start 2000 units by mouth daily with meal year round over the counter.

## 2023-05-08 NOTE — Telephone Encounter (Signed)
Patient left message for NP hydrocortisone is not helping   Recommended diphenhydramine (benadryl) gel topical and ice 15 minutes prn itching every 6 hours and emollient without fragrance after bathing.  Clinic closed 05/09/23 reopens 0800 Thursday 05/10/23  Trial start zyrtec 10mg  by mouth every 12 hours prn itching also.  Asked if rash spreading, any blisters, trouble breathing, discharge, fever, or chills.  Exitcare handout on pruritus sent to patient

## 2023-05-08 NOTE — Telephone Encounter (Signed)
Patient left message for NP 05/07/23 has red rash neck and itching.  Discussed with her to see RN Rosalita Chessman or Chantel in clinic Monday between 8a-5p.  Can trial hydrocortisone 1% or calagel for itching.  Ice.  Avoid hot steamy baths or showers.  I will be in clinic 9a-12p Tuesday 05/08/23 if no improvement for re-evaluation.  Patient stated she would see clinic RNs.

## 2023-05-09 ENCOUNTER — Ambulatory Visit (HOSPITAL_COMMUNITY): Payer: BC Managed Care – PPO

## 2023-05-16 ENCOUNTER — Other Ambulatory Visit: Payer: BC Managed Care – PPO

## 2023-05-16 NOTE — Telephone Encounter (Signed)
Patient reported still having itching neck and intermittent side of neck pain. Denied heat rash symptoms.  As she works in [employer] with only fans could be yeast/fungal rash consider selsun blue shower/diflucan treatment.   OTC treatment did not resolve symptoms.  Denied visible rash/bruising/swelling/dysphagia/dysphasia/dyspnea/coughing.  Encouraged patient to schedule appt with NP for in clinic evaluation refused appt today requested 12pm 05/17/23 scheduled.  A&Ox3 spoke full sentences without difficulty respirations even and unlabored RA.  Continue antihistamine zyrtec 10mg  po BID prn itching/rhinitis and applying emollient after bathing daily.  Patient agreed with plan of care and had no further questions at this time.

## 2023-05-17 ENCOUNTER — Ambulatory Visit: Payer: BC Managed Care – PPO

## 2023-05-17 ENCOUNTER — Encounter: Payer: Self-pay | Admitting: Registered Nurse

## 2023-05-17 ENCOUNTER — Ambulatory Visit: Payer: BC Managed Care – PPO | Admitting: Registered Nurse

## 2023-05-17 VITALS — BP 134/90 | HR 92 | Temp 98.5°F | Resp 16

## 2023-05-17 DIAGNOSIS — R030 Elevated blood-pressure reading, without diagnosis of hypertension: Secondary | ICD-10-CM

## 2023-05-17 DIAGNOSIS — J039 Acute tonsillitis, unspecified: Secondary | ICD-10-CM

## 2023-05-17 DIAGNOSIS — L299 Pruritus, unspecified: Secondary | ICD-10-CM

## 2023-05-17 MED ORDER — PREDNISONE 10 MG PO TABS: ORAL_TABLET | ORAL | 0 refills | Status: AC

## 2023-05-17 MED ORDER — SELENIUM SULFIDE 1 % EX LOTN: 1.0000 | TOPICAL_LOTION | Freq: Every day | CUTANEOUS | Status: AC

## 2023-05-17 MED ORDER — AMOXICILLIN 875 MG PO TABS: 875.0000 mg | ORAL_TABLET | Freq: Two times a day (BID) | ORAL | Status: AC

## 2023-05-17 NOTE — Patient Instructions (Addendum)
Preventing Hypertension Hypertension, also called high blood pressure, is when the force of blood pumping through the arteries is too strong. Arteries are blood vessels that carry blood from the heart throughout the body. Often, hypertension does not cause symptoms until blood pressure is very high. It is important to have your blood pressure checked regularly. Diet and lifestyle changes can help you prevent hypertension, and they may make you feel better overall and improve your quality of life. If you already have hypertension, you may control it with diet and lifestyle changes, as well as with medicine. How can this condition affect me? Over time, hypertension can damage the arteries and decrease blood flow to important parts of the body, including the brain, heart, and kidneys. By keeping your blood pressure in a healthy range, you can help prevent complications like heart attack, heart failure, stroke, kidney failure, and vascular dementia. What can increase my risk? An unhealthy diet and a lack of physical activity can make you more likely to develop high blood pressure. Some other risk factors include: Age. The risk increases with age. Having family members who have had high blood pressure. Having certain health conditions, such as thyroid problems. Being overweight or obese. Drinking too much alcohol or caffeine. Having too much fat, sugar, calories, or salt (sodium) in your diet. Smoking or using illegal drugs. Taking certain medicines, such as antidepressants, decongestants, birth control pills, and NSAIDs, such as ibuprofen. What actions can I take to prevent or manage this condition? Work with your health care provider to make a hypertension prevention plan that works for you. You may be referred for counseling on a healthy diet and physical activity. Follow your plan and keep all follow-up visits. Diet changes Maintain a healthy diet. This includes: Eating less salt (sodium). Ask your  health care provider how much sodium is safe for you to have. The general recommendation is to have less than 1 tsp (2,300 mg) of sodium a day. Do not add salt to your food. Choose low-sodium options when grocery shopping and eating out. Limiting fats in your diet. You can do this by eating low-fat or fat-free dairy products and by eating less red meat. Eating more fruits, vegetables, and whole grains. Make a goal to eat: 1-2 cups of fresh fruits and vegetables each day. 3-4 servings of whole grains each day. Avoiding foods and beverages that have added sugars. Eating fish that contain healthy fats (omega-3 fatty acids), such as mackerel or salmon. If you need help putting together a healthy eating plan, try the DASH diet. This diet is high in fruits, vegetables, and whole grains. It is low in sodium, red meat, and added sugars. DASH stands for Dietary Approaches to Stop Hypertension. Lifestyle changes  Lose weight if you are overweight. Losing just 3-5% of your body weight can help prevent or control hypertension. For example, if your present weight is 200 lb (91 kg), a loss of 3-5% of your weight means losing 6-10 lb (2.7-4.5 kg). Ask your health care provider to help you with a diet and exercise plan to safely lose weight. Get enough exercise. Do at least 150 minutes of moderate-intensity exercise each week. You could do this in short exercise sessions several times a day, or you could do longer exercise sessions a few times a week. For example, you could take a brisk 10-minute walk or bike ride, 3 times a day, for 5 days a week. Find ways to reduce stress, such as exercising, meditating, listening to  music, or taking a yoga class. If you need help reducing stress, ask your health care provider. Do not use any products that contain nicotine or tobacco. These products include cigarettes, chewing tobacco, and vaping devices, such as e-cigarettes. Chemicals in tobacco and nicotine products raise your  blood pressure each time you use them. If you need help quitting, ask your health care provider. Learn how to check your blood pressure at home. Make sure that you know your personal target blood pressure, as told by your health care provider. Try to sleep 7-9 hours per night. Alcohol use Do not drink alcohol if: Your health care provider tells you not to drink. You are pregnant, may be pregnant, or are planning to become pregnant. If you drink alcohol: Limit how much you have to: 0-1 drink a day for women. 0-2 drinks a day for men. Know how much alcohol is in your drink. In the [place] one drink equals one 12 oz bottle of beer (355 mL), one 5 oz glass of wine (148 mL), or one 1 oz glass of hard liquor (44 mL). Medicines In addition to diet and lifestyle changes, your health care provider may recommend medicines to help lower your blood pressure. In general: You may need to try a few different medicines to find what works best for you. You may need to take more than one medicine. Take over-the-counter and prescription medicines only as told by your health care provider. Questions to ask your health care provider What is my blood pressure goal? How can I lower my risk for high blood pressure? How should I monitor my blood pressure at home? Where to find support Your health care provider can help you prevent hypertension and help you keep your blood pressure at a healthy level. Your local hospital or your community may also provide support services and prevention programs. The American Heart Association offers an online support network at supportnetwork.heart.org Where to find more information Learn more about hypertension from: National Heart, Lung, and Blood Institute: PopSteam.is Centers for Disease Control and Prevention: FootballExhibition.com.br American Academy of Family Physicians: familydoctor.org Learn more about the DASH diet from: National Heart, Lung, and Blood Institute:  PopSteam.is Contact a health care provider if: You think you are having a reaction to medicines you have taken. You have recurrent headaches or feel dizzy. You have swelling in your ankles. You have trouble with your vision. Get help right away if: You have sudden, severe chest, back, or abdominal pain or discomfort. You have shortness of breath. You have a sudden, severe headache. These symptoms may be an emergency. Get help right away. Call 911. Do not wait to see if the symptoms will go away. Do not drive yourself to the hospital. Summary Hypertension often does not cause any symptoms until blood pressure is very high. It is important to get your blood pressure checked regularly. Diet and lifestyle changes are important steps in preventing hypertension. By keeping your blood pressure in a healthy range, you may prevent complications like heart attack, heart failure, stroke, and kidney failure. Work with your health care provider to make a hypertension prevention plan that works for you. This information is not intended to replace advice given to you by your health care provider. Make sure you discuss any questions you have with your health care provider. Document Revised: 06/30/2021 Document Reviewed: 06/30/2021 Elsevier Patient Education  2024 [employer] Nonallergic Rhinitis Nonallergic rhinitis is inflammation of the mucous membrane inside the nose. The mucous membrane is  the tissue that produces mucus. This condition is different from having allergic rhinitis, which is an allergy that affects the nose. Allergic rhinitis occurs when the body's defense system, or immune system, reacts to a substance that a person is allergic to (allergen), such as pollen, pet dander, mold, or dust. Nonallergic rhinitis has many similar symptoms, but it is not caused by allergens. Nonallergic rhinitis can be an acute or chronic problem. This means it can be short-term or long-term. What are the  causes? This condition may be caused by many different things. Some common types of nonallergic rhinitis include: Infectious rhinitis. This is usually caused by an infection in the nose, throat, or upper airways (upper respiratory system). Vasomotor rhinitis. This is the most common type. It is caused by too much blood flow through your nose, and makes your nose swell. It is triggered by strong odors, cold air, stress, drinking alcohol, cigarette smoke, or changes in the weather. Occupational rhinitis. This type is caused by triggers in the workplace, such as chemicals, dust, animal dander, or air pollution. Hormonal rhinitis, in female teens and adults. This type is caused by an increase in the hormone estrogen and may happen during pregnancy, puberty, or monthly menstrual periods. Hormonal rhinitis gives you fewer symptoms when estrogen levels drop. Drug-induced rhinitis. Several types of medicines can cause this, such as medicines for high blood pressure or heart disease, aspirin, or NSAIDs. Nonallergic rhinitis with eosinophilia syndrome (NARES). This type is caused by having too much eosinophil, a type of white blood cell. Other causes include a reaction to eating hot or spicy foods. This does not usually cause long-term symptoms. In some cases, the cause of nonallergic rhinitis is not known. What increases the risk? You are more likely to develop this condition if: You are 25-66 years of age. You are female. People who are female are twice as likely to have this condition. What are the signs or symptoms? Common symptoms of this condition include: Stuffy nose (nasal congestion). Runny nose. A feeling of mucus dripping down the back of your throat (postnasal drip). Trouble sleeping. Tiredness, or fatigue. Other symptoms include: Sneezing. Coughing. Itchy nose. Bloodshot eyes. How is this diagnosed? This type may be diagnosed based on: Your symptoms and medical history. A physical  exam. Allergy testing to rule out allergic rhinitis. You may have skin tests or blood tests. Your health care provider may also take a swab of nasal discharge to look for an increased number of eosinophils. This is done to confirm a diagnosis of NARES. How is this treated? Treatment for this condition depends on the cause. No single treatment works for everyone. Work with your provider to find the best treatment for you. Treatment may include: Avoiding the things that trigger your symptoms. Medicines to relieve congestion, such as: Steroid nasal spray. There are many types. You may need to try a few to find out which one works best. Engineer, civil (consulting) medicine. This treats nasal congestion and may be given by mouth or as a nasal spray. These medicines are used only for a short time. Medicines to relieve a runny nose. These may include antihistamine medicines or decongestant nasal sprays. Nasal irrigation. This involves using a salt-water (saline) spray or saline container called a neti pot. Nasal irrigation helps to clear away mucus and keep your nasal passages moist. Surgery to remove part of your mucous membrane. This is done in severe cases if the condition has not improved after 6-12 months of treatment. Follow these instructions  at home: Medicines Take or use over-the-counter and prescription medicines only as told by your provider. Do not stop using your medicine even if you start to feel better. Do not take NSAIDs, such as ibuprofen, or medicines that contain aspirin if they make your symptoms worse. Lifestyle Do not drink alcohol if it makes your symptoms worse. Do not use any products that contain nicotine or tobacco. These products include cigarettes, chewing tobacco, and vaping devices, such as e-cigarettes. If you need help quitting, ask your provider. Avoid secondhand smoke. General instructions Avoid triggers that make your symptoms worse. Use nasal irrigation as told by your  provider. Sleep with the head of your bed raised. This may reduce nasal congestion when you sleep. Drink enough fluid to keep your pee (urine) pale yellow. Contact a health care provider if: You have a fever. Your symptoms are getting worse at home. Your symptoms do not lessen with medicine. You develop new symptoms, especially a headache or nosebleed. Get help right away if: You have difficulty breathing. This symptom may be an emergency. Get help right away. Call 911. Do not wait to see if the symptoms will go away. Do not drive yourself to the hospital. This information is not intended to replace advice given to you by your health care provider. Make sure you discuss any questions you have with your health care provider. Document Revised: 05/16/2022 Document Reviewed: 05/16/2022 Elsevier Patient Education  2024 [employer]. Tinea Versicolor  Tinea versicolor is a common fungal infection. It causes a rash that looks like light or dark patches on the skin. The rash most often occurs on the chest, back, neck, or upper arms. This condition is more common during warm weather. Tinea versicolor usually does not cause any other problems than the rash. In most cases, the infection goes away in a few weeks with treatment. It may take a few months for the patches on your skin to return to your usual skin color. What are the causes? This condition occurs when a certain type of fungus (Malassezia furfur) that is normally present on the skin starts to grow too much. This fungus is a type of yeast. This condition cannot be passed from one person to another (is not contagious). What increases the risk? This condition is more likely to develop when certain factors are present, such as: Heat and humidity. Sweating too much. Hormone changes, such as those that occur when taking birth control pills. Oily skin. A weak disease-fighting system (immunesystem). What are the signs or symptoms? Symptoms of  this condition include: A rash of light or dark patches on your skin. The rash may have: Patches of tan or pink spots (on light skin). Patches of white or brown spots (on dark skin). Patches of skin that do not tan. Well-marked edges. Scales on the discolored areas. Mild itching. There may also be no itching. How is this diagnosed? A health care provider can usually diagnose this condition by looking at your skin. During the exam, he or she may use ultraviolet (UV) light to see how much of your skin has been affected. In some cases, a skin sample may be taken by scraping the rash. This sample will be viewed under a microscope to check for yeast overgrowth. How is this treated? Treatment for this condition may include: Dandruff shampoo that is applied to the affected skin during showers or bathing. Over-the-counter medicated skin cream, lotion, or soaps. Prescription antifungal medicine in the form of skin cream or pills.  Medicine to help reduce itching. Follow these instructions at home: Use over-the-counter and prescription medicines only as told by your health care provider. Apply dandruff shampoo to the affected area as told by your health care provider. Do not scratch the affected area of skin. Avoid hot and humid conditions. Do not use tanning booths. Try to avoid sweating a lot. Contact a health care provider if: Your symptoms get worse. You have a fever. You have signs of infection such as: Redness, swelling, or pain at the site of your rash. Warmth coming from your rash. Fluid or blood coming from your rash. Pus or a bad smell coming from your rash. Your rash comes back (recurs) after treatment. Your rash does not improve with treatment and spreads to other parts of the body. Summary Tinea versicolor is a common fungal infection of the skin. It causes a rash that looks like light or dark patches on the skin. The rash most often occurs on the chest, back, neck, or upper  arms. A health care provider can usually diagnose this condition by looking at your skin. Treatment may include applying shampoo to the skin and taking or applying medicines. This information is not intended to replace advice given to you by your health care provider. Make sure you discuss any questions you have with your health care provider. Document Revised: 11/30/2020 Document Reviewed: 11/30/2020 Elsevier Patient Education  2024 [employer].

## 2023-05-17 NOTE — Progress Notes (Signed)
Subjective:    Patient ID: Alexis Gordon, female    DOB: [birth year], 27 y.o.   MRN: [id number]  27y/o married established Hispanic female here for evaluation neck itching since 05/07/23 unsure if related to heat works in Electrical engineer.  Better this week since temps outside 70s/80s versus 90s previous month not sweating as much at work able to wear t-shirt and shorts at work. Has tried topical anti itch e.g. benadryl, hydrocortisone and OTC antihistamine oral helped some but did not resolve.   Has fans and water/drinks and able to take breaks in air conditioned lunch room.  Denied dyspnea/dysphagia/dysphasia/shortness of breath/wheezing/fever/chills/n/v/d/cough.  Some post nasal drip noted.  "My neck feels swollen but I don't feel any lumps"  Itching anterior neck rash has resolved but still intermittent itching.  Tonsils were enlarged in the past and enlarged again.  Has not home covid tested recently.  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.  Has had intermittent headaches denied headache at this time.  Newer puppy at home 64 months old was gift from spouse when dog 14 weeks old.      Review of Systems  Constitutional:  Positive for fatigue. Negative for chills and fever.  HENT:  Positive for postnasal drip and sore throat. Negative for congestion, dental problem, ear discharge, ear pain, facial swelling, hearing loss, mouth sores, nosebleeds, sinus pressure, sinus pain, trouble swallowing and voice change.   Eyes:  Negative for photophobia and visual disturbance.  Respiratory:  Negative for cough, choking, shortness of breath, wheezing and stridor.   Cardiovascular:  Negative for palpitations.  Gastrointestinal:  Negative for diarrhea, nausea and vomiting.  Genitourinary:  Negative for difficulty urinating.  Musculoskeletal:   Negative for back pain, gait problem, myalgias, neck pain and neck stiffness.  Skin:  Positive for rash. Negative for color change, pallor and wound.  Neurological:  Positive for headaches. Negative for dizziness, tremors, seizures, syncope, facial asymmetry, speech difficulty, weakness, light-headedness and numbness.  Psychiatric/Behavioral:  Negative for agitation, confusion and sleep disturbance.        Objective:   Physical Exam Vitals and nursing note reviewed.  Constitutional:      General: She is awake. She is not in acute distress.    Appearance: Normal appearance. She is well-developed, well-groomed and overweight. She is not ill-appearing, toxic-appearing or diaphoretic.  HENT:     Head: Normocephalic and atraumatic.     Jaw: There is normal jaw occlusion. No trismus.     Salivary Glands: Right salivary gland is not diffusely enlarged or tender. Left salivary gland is not diffusely enlarged or tender.     Right Ear: Hearing, ear canal and external ear normal. No decreased hearing noted. No laceration, drainage, swelling or tenderness. A middle ear effusion is present. There is no impacted cerumen. No foreign body. No mastoid tenderness. No PE tube. No hemotympanum. Tympanic membrane is not injected, scarred, perforated, erythematous, retracted or bulging.     Left Ear: Hearing, ear canal and external ear normal. No decreased hearing noted. No laceration, drainage, swelling or tenderness. A middle ear effusion is present. There is no impacted cerumen. No foreign body. No mastoid tenderness. No PE tube. No hemotympanum. Tympanic membrane is not injected, scarred, perforated, erythematous, retracted or bulging.     Nose: Mucosal edema, congestion and rhinorrhea present. No nasal deformity, septal deviation or laceration. Rhinorrhea is clear.  Right Turbinates: Not enlarged, swollen or pale.     Left Turbinates: Not enlarged, swollen or pale.     Right Sinus: No maxillary sinus  tenderness or frontal sinus tenderness.     Left Sinus: No maxillary sinus tenderness or frontal sinus tenderness.     Mouth/Throat:     Lips: Pink. No lesions.     Mouth: Mucous membranes are moist. Mucous membranes are not pale, not dry and not cyanotic. No lacerations, oral lesions or angioedema.     Dentition: No dental abscesses or gum lesions.     Tongue: No lesions. Tongue does not deviate from midline.     Palate: No mass and lesions.     Pharynx: Uvula midline. Pharyngeal swelling, posterior oropharyngeal erythema and postnasal drip present. No oropharyngeal exudate or uvula swelling.     Tonsils: No tonsillar exudate or tonsillar abscesses. 3+ on the right. 3+ on the left.     Comments: Drip from above clear; cobblestoning posterior pharynx edema/erythema; tonsils 3+/4 macular erythema no exudates/stones/not cryptic; bilateral TMs air fluid level clear; auditory canals without debris/edema/erythema; clear discharge bilateral nasal turbinates nasal sniffing observed in exam room; no throat clearing observed in exam room Eyes:     General: Lids are normal. Vision grossly intact. Gaze aligned appropriately. Allergic shiner present. No scleral icterus.       Right eye: No foreign body, discharge or hordeolum.        Left eye: No foreign body, discharge or hordeolum.     Extraocular Movements: Extraocular movements intact.     Right eye: Normal extraocular motion and no nystagmus.     Left eye: Normal extraocular motion and no nystagmus.     Conjunctiva/sclera: Conjunctivae normal.     Right eye: Right conjunctiva is not injected. No chemosis, exudate or hemorrhage.    Left eye: Left conjunctiva is not injected. No chemosis, exudate or hemorrhage.    Pupils: Pupils are equal, round, and reactive to light. Pupils are equal.     Right eye: Pupil is round and reactive.     Left eye: Pupil is round and reactive.  Neck:     Thyroid: No thyroid mass or thyromegaly.     Trachea: Trachea and  phonation normal. No tracheal tenderness, abnormal tracheal secretions or tracheal deviation.  Cardiovascular:     Rate and Rhythm: Normal rate and regular rhythm.     Pulses: Normal pulses.          Radial pulses are 2+ on the right side and 2+ on the left side.     Heart sounds: Normal heart sounds, S1 normal and S2 normal.  Pulmonary:     Effort: Pulmonary effort is normal. No accessory muscle usage or respiratory distress.     Breath sounds: Normal breath sounds and air entry. No stridor, decreased air movement or transmitted upper airway sounds. No decreased breath sounds, wheezing, rhonchi or rales.     Comments: Coarse breath sounds noted; patient with opaque baby blue finger polish intermittent sp02 monitoring 90-96% capillary refill less than 2 seconds warm dry skin body wide Chest:     Chest wall: No tenderness.  Abdominal:     General: There is no distension.     Palpations: Abdomen is soft.     Tenderness: There is no abdominal tenderness. There is no guarding.  Musculoskeletal:        General: No tenderness. Normal range of motion.     Right hand: Normal strength. Normal  capillary refill.     Left hand: Normal strength. Normal capillary refill.     Cervical back: Normal range of motion and neck supple. No swelling, edema, deformity, erythema, signs of trauma, lacerations, rigidity, spasms, torticollis, tenderness, bony tenderness or crepitus. No pain with movement. Normal range of motion.     Thoracic back: No swelling, edema, deformity, signs of trauma, lacerations, spasms, tenderness or bony tenderness. Normal range of motion.     Right hip: Normal.     Left hip: Normal.     Right knee: Normal.     Left knee: Normal.     Right lower leg: No edema.     Left lower leg: No edema.  Lymphadenopathy:     Head:     Right side of head: No submental, submandibular, tonsillar, preauricular, posterior auricular or occipital adenopathy.     Left side of head: No submental,  submandibular, tonsillar, preauricular, posterior auricular or occipital adenopathy.     Cervical: No cervical adenopathy.     Right cervical: No superficial, deep or posterior cervical adenopathy.    Left cervical: No superficial, deep or posterior cervical adenopathy.  Skin:    General: Skin is warm and dry.     Capillary Refill: Capillary refill takes less than 2 seconds.     Coloration: Skin is not ashen, cyanotic, jaundiced, mottled, pale or sallow.     Findings: No abrasion, abscess, acne, bruising, burn, ecchymosis, erythema, signs of injury, laceration, lesion, petechiae, rash or wound. Rash is not crusting, macular, nodular, papular, purpuric, pustular, scaling, urticarial or vesicular.     Nails: There is no clubbing.  Neurological:     General: No focal deficit present.     Mental Status: She is alert and oriented to person, place, and time. Mental status is at baseline. She is not disoriented.     GCS: GCS eye subscore is 4. GCS verbal subscore is 5. GCS motor subscore is 6.     Cranial Nerves: Cranial nerves 2-12 are intact. No cranial nerve deficit, dysarthria or facial asymmetry.     Sensory: No sensory deficit.     Motor: Motor function is intact. No weakness, tremor, atrophy, abnormal muscle tone or seizure activity.     Coordination: Coordination is intact. Coordination normal.     Gait: Gait is intact. Gait normal.     Comments: In/out of chair and on/off exam table without difficulty; gait sure and steady in clinic; bilateral hand grasp equal 5/5  Psychiatric:        Attention and Perception: Attention and perception normal.        Mood and Affect: Mood and affect normal.        Speech: Speech normal.        Behavior: Behavior normal. Behavior is cooperative.        Thought Content: Thought content normal.        Cognition and Memory: Cognition and memory normal.        Judgment: Judgment normal.     Home covid test results negative    Assessment & Plan:  A-acute  tonsillitis, pruritis, elevated blood pressure reading without diagnosis hypertension  P-Start prednisone today taper 10mg  sig t3 po x 2 days then t2 po x 2 days then t1 po x 2 days #21 RF0 dispensed to patient from [facility].  If white spots back of throat start penicillin (amoxicillin 875mg  po BID x 10 days #20 RF0 dispensed from [facility] to patient today).  Home  covid test today before returning to workcenter.  Discussed waste water monitoring in state red level for past 2 weeks and around [place] high transmission 26 states.  New covid vaccines in process of being approved should be available in September.  Avoid people with cough/rhinitis/crowds.  Given 1 free [place] govt test. re covid test if new or worsening symptoms in 48 hours x 2 as current variant taking longer to turn positive on home testing.  Discussed this could be fall ragweed allergies as ditches starting to turn yellow this past week.  Tylenol 1000mg  po q6h prn pain.  Discussed salt water gargles 1-2 times per day or sipping soup will help to decrease throat swelling/tonsils.  Claritin 10mg  po daily given 3 UD from clinic stock continue as post nasal drip.  Has nasal saline at home continue 2 sprays each nostril q2h prn congestion or if thick mucous. Fluticasone nasal 1 spray each nostril BID OTC.  Patient denied personal or family history of ENT cancer.  OTC antihistamine of choice claritin/zyrtec 10mg  po daily.  Avoid triggers if possible.  Shower prior to bedtime if exposed to triggers.  If allergic dust/dust mites recommend mattress/pillow covers/encasements; washing linens, vacuuming, sweeping, dusting weekly.  Call or return to clinic as needed if these symptoms worsen or fail to improve as anticipated.   Exitcare handout on nonallergic rhinitis and sinus rinse   Patient verbalized understanding of instructions, agreed with plan of care and had no further questions at this time.  P2:  Avoidance and hand washing.   Discussed tinea fungal infections  skin with patient.  At higher risk due to frequent sweating at work due to working in Electrical engineer.  Avoid staying in sweat soaked clothes after work.  Consider selsun blue shampoo application in shower and leave on for 5 minutes before rinsing off and repeat daily prn.  Discussed steroid creams would not help tinea infection and could results in more itching/burning sensation.  Exitcare handout on tinea versicolor  Return to clinic if no improvement with plan of if worsening itching/new visible rash for re-evaluation .Patient agreed with plan of care and had no further questions at this time.  Elevated blood pressure today.  Discussed may be causing headache or has viral illness causing elevated BP and headache covid testing today.  Avoid caffeine this afternoon and salt intake greater than 2000mg  per day. Tylenol 1000mg  po q6h prn headache and increase water intake as dehydration can cause headache also.  Monitor urine color if orange/dark yellow increase water intake until pale yellow in toilet.   Exitcare handout on preventing hypertension.  New puppy has separation anxiety if cannot see her at work and elevates her stress at work.   ER if chest pain, worst headache of life, dyspnea or visual changes for re-evaluation.  Will see clinic RN for repeat BP check in 2 weeks.  Discussed if chronically elevated 130/80 may be related to/causing her headaches consider starting medication if chronically elevated above goal when not sick.  Patient verbalized understanding information/instructions, agreed with plan of care and had no further questions at this time.

## 2023-05-23 NOTE — Telephone Encounter (Signed)
See office visit 05/17/23 note.  Message left for patient asking if symptoms resolved with plan of care.

## 2023-05-23 NOTE — Telephone Encounter (Signed)
Patient reported new medication helped itching and had no further concerns or questions.

## 2023-06-21 ENCOUNTER — Encounter: Payer: Self-pay | Admitting: Registered Nurse

## 2023-06-21 ENCOUNTER — Ambulatory Visit: Payer: BC Managed Care – PPO | Admitting: Registered Nurse

## 2023-06-21 VITALS — Temp 97.6°F | Resp 16

## 2023-06-21 DIAGNOSIS — J00 Acute nasopharyngitis [common cold]: Secondary | ICD-10-CM

## 2023-06-21 DIAGNOSIS — H6993 Unspecified Eustachian tube disorder, bilateral: Secondary | ICD-10-CM

## 2023-06-21 MED ORDER — LORATADINE 10 MG PO TABS: 10.0000 mg | ORAL_TABLET | Freq: Every day | ORAL | Status: DC | PRN

## 2023-06-21 MED ORDER — FLUTICASONE PROPIONATE 50 MCG/ACT NA SUSP: 1.0000 | Freq: Two times a day (BID) | NASAL | Status: DC

## 2023-06-21 MED ORDER — SALINE SPRAY 0.65 % NA SOLN: 2.0000 | NASAL | Status: DC
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
## 2023-06-21 NOTE — Patient Instructions (Signed)
Eustachian Tube Dysfunction  Eustachian tube dysfunction refers to a condition in which a blockage develops in the narrow passage that connects the middle ear to the back of the nose (eustachian tube). The eustachian tube regulates air pressure in the middle ear by letting air move between the ear and nose. It also helps to drain fluid from the middle ear space. Eustachian tube dysfunction can affect one or both ears. When the eustachian tube does not function properly, air pressure, fluid, or both can build up in the middle ear. What are the causes? This condition occurs when the eustachian tube becomes blocked or cannot open normally. Common causes of this condition include: Ear infections. Colds and other infections that affect the nose, mouth, and throat (upper respiratory tract). Allergies. Irritation from cigarette smoke. Irritation from stomach acid coming up into the esophagus (gastroesophageal reflux). The esophagus is the part of the body that moves food from the mouth to the stomach. Sudden changes in air pressure, such as from descending in an airplane or scuba diving. Abnormal growths in the nose or throat, such as: Growths that line the nose (nasal polyps). Abnormal growth of cells (tumors). Enlarged tissue at the back of the throat (adenoids). What increases the risk? You are more likely to develop this condition if: You smoke. You are overweight. You are a child who has: Certain birth defects of the mouth, such as cleft palate. Large tonsils or adenoids. What are the signs or symptoms? Common symptoms of this condition include: A feeling of fullness in the ear. Ear pain. Clicking or popping noises in the ear. Ringing in the ear (tinnitus). Hearing loss. Loss of balance. Dizziness. Symptoms may get worse when the air pressure around you changes, such as when you travel to an area of high elevation, fly on an airplane, or go scuba diving. How is this diagnosed? This  condition may be diagnosed based on: Your symptoms. A physical exam of your ears, nose, and throat. Tests, such as those that measure: The movement of your eardrum. Your hearing (audiometry). How is this treated? Treatment depends on the cause and severity of your condition. In mild cases, you may relieve your symptoms by moving air into your ears. This is called "popping the ears." In more severe cases, or if you have symptoms of fluid in your ears, treatment may include: Medicines to relieve congestion (decongestants). Medicines that treat allergies (antihistamines). Nasal sprays or ear drops that contain medicines that reduce swelling (steroids). A procedure to drain the fluid in your eardrum. In this procedure, a small tube may be placed in the eardrum to: Drain the fluid. Restore the air in the middle ear space. A procedure to insert a balloon device through the nose to inflate the opening of the eustachian tube (balloon dilation). Follow these instructions at home: Lifestyle Do not do any of the following until your health care provider approves: Travel to high altitudes. Fly in airplanes. Work in a Estate agent or room. Scuba dive. Do not use any products that contain nicotine or tobacco. These products include cigarettes, chewing tobacco, and vaping devices, such as e-cigarettes. If you need help quitting, ask your health care provider. Keep your ears dry. Wear fitted earplugs during showering and bathing. Dry your ears completely after. General instructions Take over-the-counter and prescription medicines only as told by your health care provider. Use techniques to help pop your ears as recommended by your health care provider. These may include: Chewing gum. Yawning. Frequent, forceful swallowing.  Closing your mouth, holding your nose closed, and gently blowing as if you are trying to blow air out of your nose. Keep all follow-up visits. This is important. Contact a  health care provider if: Your symptoms do not go away after treatment. Your symptoms come back after treatment. You are unable to pop your ears. You have: A fever. Pain in your ear. Pain in your head or neck. Fluid draining from your ear. Your hearing suddenly changes. You become very dizzy. You lose your balance. Get help right away if: You have a sudden, severe increase in any of your symptoms. Summary Eustachian tube dysfunction refers to a condition in which a blockage develops in the eustachian tube. It can be caused by ear infections, allergies, inhaled irritants, or abnormal growths in the nose or throat. Symptoms may include ear pain or fullness, hearing loss, or ringing in the ears. Mild cases are treated with techniques to unblock the ears, such as yawning or chewing gum. More severe cases are treated with medicines or procedures. This information is not intended to replace advice given to you by your health care provider. Make sure you discuss any questions you have with your health care provider. Document Revised: 11/22/2020 Document Reviewed: 11/22/2020 Elsevier Patient Education  2024 [employer] Nonallergic Rhinitis Nonallergic rhinitis is inflammation of the mucous membrane inside the nose. The mucous membrane is the tissue that produces mucus. This condition is different from having allergic rhinitis, which is an allergy that affects the nose. Allergic rhinitis occurs when the body's defense system, or immune system, reacts to a substance that a person is allergic to (allergen), such as pollen, pet dander, mold, or dust. Nonallergic rhinitis has many similar symptoms, but it is not caused by allergens. Nonallergic rhinitis can be an acute or chronic problem. This means it can be short-term or long-term. What are the causes? This condition may be caused by many different things. Some common types of nonallergic rhinitis include: Infectious rhinitis. This is usually  caused by an infection in the nose, throat, or upper airways (upper respiratory system). Vasomotor rhinitis. This is the most common type. It is caused by too much blood flow through your nose, and makes your nose swell. It is triggered by strong odors, cold air, stress, drinking alcohol, cigarette smoke, or changes in the weather. Occupational rhinitis. This type is caused by triggers in the workplace, such as chemicals, dust, animal dander, or air pollution. Hormonal rhinitis, in female teens and adults. This type is caused by an increase in the hormone estrogen and may happen during pregnancy, puberty, or monthly menstrual periods. Hormonal rhinitis gives you fewer symptoms when estrogen levels drop. Drug-induced rhinitis. Several types of medicines can cause this, such as medicines for high blood pressure or heart disease, aspirin, or NSAIDs. Nonallergic rhinitis with eosinophilia syndrome (NARES). This type is caused by having too much eosinophil, a type of white blood cell. Other causes include a reaction to eating hot or spicy foods. This does not usually cause long-term symptoms. In some cases, the cause of nonallergic rhinitis is not known. What increases the risk? You are more likely to develop this condition if: You are 40-34 years of age. You are female. People who are female are twice as likely to have this condition. What are the signs or symptoms? Common symptoms of this condition include: Stuffy nose (nasal congestion). Runny nose. A feeling of mucus dripping down the back of your throat (postnasal drip). Trouble sleeping. Tiredness, or fatigue.  Other symptoms include: Sneezing. Coughing. Itchy nose. Bloodshot eyes. How is this diagnosed? This type may be diagnosed based on: Your symptoms and medical history. A physical exam. Allergy testing to rule out allergic rhinitis. You may have skin tests or blood tests. Your health care provider may also take a swab of nasal  discharge to look for an increased number of eosinophils. This is done to confirm a diagnosis of NARES. How is this treated? Treatment for this condition depends on the cause. No single treatment works for everyone. Work with your provider to find the best treatment for you. Treatment may include: Avoiding the things that trigger your symptoms. Medicines to relieve congestion, such as: Steroid nasal spray. There are many types. You may need to try a few to find out which one works best. Engineer, civil (consulting) medicine. This treats nasal congestion and may be given by mouth or as a nasal spray. These medicines are used only for a short time. Medicines to relieve a runny nose. These may include antihistamine medicines or decongestant nasal sprays. Nasal irrigation. This involves using a salt-water (saline) spray or saline container called a neti pot. Nasal irrigation helps to clear away mucus and keep your nasal passages moist. Surgery to remove part of your mucous membrane. This is done in severe cases if the condition has not improved after 6-12 months of treatment. Follow these instructions at home: Medicines Take or use over-the-counter and prescription medicines only as told by your provider. Do not stop using your medicine even if you start to feel better. Do not take NSAIDs, such as ibuprofen, or medicines that contain aspirin if they make your symptoms worse. Lifestyle Do not drink alcohol if it makes your symptoms worse. Do not use any products that contain nicotine or tobacco. These products include cigarettes, chewing tobacco, and vaping devices, such as e-cigarettes. If you need help quitting, ask your provider. Avoid secondhand smoke. General instructions Avoid triggers that make your symptoms worse. Use nasal irrigation as told by your provider. Sleep with the head of your bed raised. This may reduce nasal congestion when you sleep. Drink enough fluid to keep your pee (urine) pale  yellow. Contact a health care provider if: You have a fever. Your symptoms are getting worse at home. Your symptoms do not lessen with medicine. You develop new symptoms, especially a headache or nosebleed. Get help right away if: You have difficulty breathing. This symptom may be an emergency. Get help right away. Call 911. Do not wait to see if the symptoms will go away. Do not drive yourself to the hospital. This information is not intended to replace advice given to you by your health care provider. Make sure you discuss any questions you have with your health care provider. Document Revised: 05/16/2022 Document Reviewed: 05/16/2022 Elsevier Patient Education  2024 [employer] Allergic Rhinitis, Adult  Allergic rhinitis is an allergic reaction that affects the mucous membrane inside the nose. The mucous membrane is the tissue that produces mucus. There are two types of allergic rhinitis: Seasonal. This type is also called hay fever and happens only during certain seasons. Perennial. This type can happen at any time of the year. Allergic rhinitis cannot be spread from person to person. This condition can be mild, bad, or very bad. It can develop at any age and may be outgrown. What are the causes? This condition is caused by allergens. These are things that can cause an allergic reaction. Allergens may differ for seasonal allergic rhinitis and  perennial allergic rhinitis. Seasonal allergic rhinitis is caused by pollen. Pollen can come from grasses, trees, and weeds. Perennial allergic rhinitis may be caused by: Dust mites. Proteins in a pet's pee (urine), saliva, or dander. Dander is dead skin cells from a pet. Smoke, mold, or car fumes. Remains of or waste from insects such as cockroaches. What increases the risk? You are more likely to develop this condition if you have a family history of allergies or other conditions related to allergies, including: Allergic conjunctivitis.  This is irritation and swelling of parts of the eyes and eyelids. Asthma. This condition affects the lungs and makes it hard to breathe. Atopic dermatitis or eczema. This is long term (chronic) irritation and swelling of the skin. Food allergies. What are the signs or symptoms? Symptoms of this condition include: Sneezing or coughing. A stuffy nose (nasal congestion), itchy nose, or nasal discharge. Itchy eyes and tearing of the eyes. A feeling of mucus dripping down the back of your throat (postnasal drip). This may cause a sore throat. Trouble sleeping. Tiredness. Headache. How is this diagnosed? This condition may be diagnosed with your symptoms, your medical history, and a physical exam. Your health care provider may check for related conditions, such as: Asthma. Pink eye. This is eye swelling and irritation caused by infection (conjunctivitis). Ear infection. Upper respiratory infection. This is an infection in the nose, throat, or upper airways. You may also have tests to find out which allergens cause your symptoms. These may include skin tests or blood tests. How is this treated? There is no cure for this condition, but treatment can help control symptoms. Treatment may include: Taking medicines that block allergy symptoms, such as corticosteroids (anti-inflammatories) and antihistamines. Medicine may be given as a shot, nasal spray, or pill. Avoiding any allergens. Being exposed again and again to tiny amounts of allergens to help you build a defense against allergens (allergenimmunotherapy). This is done if other treatments have not helped. It may include: Allergy shots. These are injected medicines that have small amounts of an allergen in them. Sublingual immunotherapy. This involves taking small doses of a medicine with an allergen in it under your tongue. If these treatments do not work, your provider may prescribe newer, stronger medicines. Follow these instructions at  home: Avoiding allergens Find out what you are allergic to and avoid those allergens. These are some things you can do to help avoid allergens: If you have perennial allergies: Replace carpet with wood, tile, or vinyl flooring. Carpet can trap dander and dust. Do not smoke. Do not allow smoking in your home Change your heating and air conditioning filters at least once a month. If you have seasonal allergies, take these steps during allergy season: Keep windows closed as much as possible. Plan outdoor activities when pollen counts are lowest. Check pollen counts before you plan outdoor activities When coming indoors, change clothing and shower before sitting on furniture or bedding. If you have a pet in the house that produces allergens: Keep the pet out of the bedroom. Vacuum, sweep, and dust regularly. General instructions Take over-the-counter and prescription medicines only as told by your provider. Drink enough fluid to keep your pee pale yellow. Where to find more information American Academy of Allergy, Asthma & Immunology: aaaai.org Contact a health care provider if: You have a fever. You develop a cough that does not go away. You make high-pitched whistling sounds when you breathe, most often when you breathe out (wheeze). Your symptoms slow you  down or stop you from doing your normal activities each day. Get help right away if: You have shortness of breath. This symptom may be an emergency. Get help right away. Call 911. Do not wait to see if the symptoms will go away. Do not drive yourself to the hospital. This information is not intended to replace advice given to you by your health care provider. Make sure you discuss any questions you have with your health care provider. Document Revised: 05/22/2022 Document Reviewed: 05/22/2022 Elsevier Patient Education  2024 [employer] Inc. How to Perform a Sinus Rinse A sinus rinse is a home treatment that is used to rinse your  sinuses with a germ-free (sterile) mixture of salt and water (saline solution). Sinuses are air-filled spaces in your skull that are behind the bones of your face and forehead. They open into your nasal cavity. A sinus rinse can help to clear mucus, dirt, dust, or pollen from your nasal cavity. You may do a sinus rinse when you have a cold, a virus, nasal allergy symptoms, a sinus infection, or stuffiness in your nose or sinuses. What are the risks? A sinus rinse is generally safe and effective. However, there are a few risks, which include: A burning sensation in your sinuses. This may happen if you do not make the saline solution as directed. Be sure to follow all directions when making the saline solution. Nasal irritation. Infection. This may be from unclean supplies or from contaminated water. Infection from contaminated water is rare, but possible. Do not do a sinus rinse if you have had ear or nasal surgery, ear infection, or plugged ears, unless recommended by your health care provider. Supplies needed: Saline solution or powder. Distilled or sterile water to mix with saline powder. You may use boiled and cooled tap water. Boil tap water for 5 minutes; cool until it is lukewarm. Use within 24 hours. Do not use regular tap water to mix with the saline solution. Neti pot or nasal rinse bottle. These supplies release the saline solution into your nose and through your sinuses. Neti pots and nasal rinse bottles can be purchased at [employer], a health food store, or online. How to perform a sinus rinse  Wash your hands with soap and water for at least 20 seconds. If soap and water are not available, use hand sanitizer. Wash your device according to the directions that came with the product and then dry it. Use the solution that comes with your product or one that is sold separately in stores. Follow the mixing directions on the package to mix with sterile or distilled water. Fill the  device with the amount of saline solution noted in the device instructions. Stand by a sink and tilt your head sideways over the sink. Place the spout of the device in your upper nostril (the one closer to the ceiling). Gently pour or squeeze the saline solution into your nasal cavity. The liquid should drain out from the lower nostril if you are not too congested. While rinsing, breathe through your open mouth. Gently blow your nose to clear any mucus and rinse solution. Blowing too hard may cause ear pain. Turn your head in the other direction and repeat in your other nostril. Clean and rinse your device with clean water and then air-dry it. Talk with your health care provider or pharmacist if you have questions about how to do a sinus rinse. Summary A sinus rinse is a home treatment that is used to  rinse your sinuses with a sterile mixture of salt and water (saline solution). You may do a sinus rinse when you have a cold, a virus, nasal allergy symptoms, a sinus infection, or stuffiness in your nose or sinuses. A sinus rinse is generally safe and effective. Follow all instructions carefully. This information is not intended to replace advice given to you by your health care provider. Make sure you discuss any questions you have with your health care provider. Document Revised: 02/28/2021 Document Reviewed: 02/28/2021 Elsevier Patient Education  2024 [employer].

## 2023-07-12 ENCOUNTER — Encounter: Payer: Self-pay | Admitting: Registered Nurse

## 2023-07-12 ENCOUNTER — Telehealth: Payer: Self-pay | Admitting: Registered Nurse

## 2023-07-12 ENCOUNTER — Ambulatory Visit: Payer: BC Managed Care – PPO

## 2023-07-12 DIAGNOSIS — R300 Dysuria: Secondary | ICD-10-CM

## 2023-07-12 DIAGNOSIS — R319 Hematuria, unspecified: Secondary | ICD-10-CM

## 2023-07-12 LAB — POCT URINALYSIS DIPSTICK
Bilirubin, UA: NEGATIVE
Glucose, UA: NEGATIVE
Leukocytes, UA: NEGATIVE
Nitrite, UA: NEGATIVE
Protein, UA: NEGATIVE
Spec Grav, UA: 1.015 (ref 1.010–1.025)
Urobilinogen, UA: 0.2 U/dL
pH, UA: 5 (ref 5.0–8.0)

## 2023-07-12 MED ORDER — PHENAZOPYRIDINE HCL 95 MG PO TABS
95.0000 mg | ORAL_TABLET | Freq: Three times a day (TID) | ORAL | Status: AC | PRN
Start: 2023-07-12 — End: 2023-07-15

## 2023-07-12 NOTE — Telephone Encounter (Signed)
See results note discussed with patient in person in [facility]center.  Reiterated increase water intake and azo prn otc until urine culture results known. Results typically return in 3 days sometimes sooner and other times may take up to 5 days if slow growing bacteria. Dipstick urinalysis blood and ketones but no nitrates/leukocytes typically seen with bacterial infection.  Denied back/abdomen pain/fever/chills/swelling of hands/feet or headache.  Denied worsening of symptoms over the course of day just having some urgency with voiding but denied burning/gross hematuria/smell/cloudy.  Discussed with patient some sediment and haziness to her sample today also but no mal odor.  Patient verbalized understanding information/instructions and had no further questions at that time.

## 2023-07-12 NOTE — Progress Notes (Signed)
Pt reports discomfort with urination.

## 2023-07-12 NOTE — Telephone Encounter (Signed)
Patient reported started to have discomfort with urination today.  Denied frank blood, smell, fever, chills, back pain, headache, vomiting/nausea, swelling of hands/feet.  History of UTI discussed come to clinic to give urine sample to RN Chantel before 5pm.  Increase water intake and may use azo 100mg  1-2 tabs po every 8 hours prn dysuria otc.  Seek same day re-evaluation if repetitive vomiting, worsening abdomen/back pain or if unable to void every 8 hours or tea/cola colored brown urine.  Poct dipstick urinalysis today and if abnormal send urine culture to [facility] discussed with RN Chantel.  Patient A&Ox3 spoke full sentences without difficulty skin warm dry and pink respiration even and unlabored gait sure and steady in workcenter.

## 2023-07-17 LAB — URINE CULTURE

## 2023-07-17 MED ORDER — AMOXICILLIN-POT CLAVULANATE 875-125 MG PO TABS
1.0000 | ORAL_TABLET | Freq: Two times a day (BID) | ORAL | Status: AC
Start: 2023-07-17 — End: 2023-07-27

## 2023-07-25 ENCOUNTER — Telehealth: Payer: Self-pay | Admitting: Registered Nurse

## 2023-07-25 ENCOUNTER — Encounter: Payer: Self-pay | Admitting: Registered Nurse

## 2023-07-25 DIAGNOSIS — R300 Dysuria: Secondary | ICD-10-CM

## 2023-07-25 NOTE — Telephone Encounter (Signed)
Not improving with antibiotics asked patient to see me in clinic tomorrow and give urine sample for POCT dipstick urinalysis.  Patient to schedule appt between [id number] Thursday 07/26/23

## 2023-07-26 ENCOUNTER — Ambulatory Visit: Payer: BC Managed Care – PPO | Admitting: Registered Nurse

## 2023-07-26 ENCOUNTER — Encounter: Payer: Self-pay | Admitting: Registered Nurse

## 2023-07-26 VITALS — BP 127/89 | HR 81 | Temp 97.2°F | Resp 16

## 2023-07-26 DIAGNOSIS — B3731 Acute candidiasis of vulva and vagina: Secondary | ICD-10-CM

## 2023-07-26 DIAGNOSIS — L308 Other specified dermatitis: Secondary | ICD-10-CM

## 2023-07-26 DIAGNOSIS — R300 Dysuria: Secondary | ICD-10-CM

## 2023-07-26 LAB — POCT URINALYSIS DIPSTICK
Bilirubin, UA: NEGATIVE
Blood, UA: NEGATIVE
Glucose, UA: NEGATIVE
Ketones, UA: NEGATIVE
Nitrite, UA: NEGATIVE
Protein, UA: NEGATIVE
Spec Grav, UA: 1.01 (ref 1.010–1.025)
Urobilinogen, UA: 0.2 U/dL
pH, UA: 6 (ref 5.0–8.0)

## 2023-07-26 MED ORDER — FLUCONAZOLE 150 MG PO TABS
ORAL_TABLET | ORAL | 0 refills | Status: DC
Start: 2023-07-26 — End: 2023-12-20

## 2023-07-26 NOTE — Patient Instructions (Signed)
Tinea Versicolor  Tinea versicolor is a common fungal infection. It causes a rash that looks like light or dark patches on the skin. The rash most often occurs on the chest, back, neck, or upper arms. This condition is more common during warm weather. Tinea versicolor usually does not cause any other problems than the rash. In most cases, the infection goes away in a few weeks with treatment. It may take a few months for the patches on your skin to return to your usual skin color. What are the causes? This condition occurs when a certain type of fungus (Malassezia furfur) that is normally present on the skin starts to grow too much. This fungus is a type of yeast. This condition cannot be passed from one person to another (is not contagious). What increases the risk? This condition is more likely to develop when certain factors are present, such as: Heat and humidity. Sweating too much. Hormone changes, such as those that occur when taking birth control pills. Oily skin. A weak disease-fighting system (immunesystem). What are the signs or symptoms? Symptoms of this condition include: A rash of light or dark patches on your skin. The rash may have: Patches of tan or pink spots (on light skin). Patches of white or brown spots (on dark skin). Patches of skin that do not tan. Well-marked edges. Scales on the discolored areas. Mild itching. There may also be no itching. How is this diagnosed? A health care provider can usually diagnose this condition by looking at your skin. During the exam, he or she may use ultraviolet (UV) light to see how much of your skin has been affected. In some cases, a skin sample may be taken by scraping the rash. This sample will be viewed under a microscope to check for yeast overgrowth. How is this treated? Treatment for this condition may include: Dandruff shampoo that is applied to the affected skin during showers or bathing. Over-the-counter medicated skin  cream, lotion, or soaps. Prescription antifungal medicine in the form of skin cream or pills. Medicine to help reduce itching. Follow these instructions at home: Use over-the-counter and prescription medicines only as told by your health care provider. Apply dandruff shampoo to the affected area as told by your health care provider. Do not scratch the affected area of skin. Avoid hot and humid conditions. Do not use tanning booths. Try to avoid sweating a lot. Contact a health care provider if: Your symptoms get worse. You have a fever. You have signs of infection such as: Redness, swelling, or pain at the site of your rash. Warmth coming from your rash. Fluid or blood coming from your rash. Pus or a bad smell coming from your rash. Your rash comes back (recurs) after treatment. Your rash does not improve with treatment and spreads to other parts of the body. Summary Tinea versicolor is a common fungal infection of the skin. It causes a rash that looks like light or dark patches on the skin. The rash most often occurs on the chest, back, neck, or upper arms. A health care provider can usually diagnose this condition by looking at your skin. Treatment may include applying shampoo to the skin and taking or applying medicines. This information is not intended to replace advice given to you by your health care provider. Make sure you discuss any questions you have with your health care provider. Document Revised: 11/30/2020 Document Reviewed: 11/30/2020 Elsevier Patient Education  2024 [employer] Vaginal Yeast Infection, Adult  Vaginal yeast  infection is a condition that causes vaginal discharge as well as soreness, swelling, and redness (inflammation) of the vagina. This is a common condition. Some women get this infection frequently. What are the causes? This condition is caused by a change in the normal balance of the yeast (Candida) and normal bacteria that live in the vagina.  This change causes an overgrowth of yeast, which causes the inflammation. What increases the risk? The condition is more likely to develop in women who: Take antibiotic medicines. Have diabetes. Take birth control pills. Are pregnant. Douche often. Have a weak body defense system (immune system). Have been taking steroid medicines for a long time. Frequently wear tight clothing. What are the signs or symptoms? Symptoms of this condition include: White, thick, creamy vaginal discharge. Swelling, itching, redness, and irritation of the vagina. The lips of the vagina (labia) may be affected as well. Pain or a burning feeling while urinating. Pain during sex. How is this diagnosed? This condition is diagnosed based on: Your medical history. A physical exam. A pelvic exam. Your health care provider will examine a sample of your vaginal discharge under a microscope. Your health care provider may send this sample for testing to confirm the diagnosis. How is this treated? This condition is treated with medicine. Medicines may be over-the-counter or prescription. You may be told to use one or more of the following: Medicine that is taken by mouth (orally). Medicine that is applied as a cream (topically). Medicine that is inserted directly into the vagina (suppository). Follow these instructions at home: Take or apply over-the-counter and prescription medicines only as told by your health care provider. Do not use tampons until your health care provider approves. Do not have sex until your infection has cleared. Sex can prolong or worsen your symptoms of infection. Ask your health care provider when it is safe to resume sexual activity. Keep all follow-up visits. This is important. How is this prevented?  Do not wear tight clothes, such as pantyhose or tight pants. Wear breathable cotton underwear. Do not use douches, perfumed soap, creams, or powders. Wipe from front to back after using  the toilet. If you have diabetes, keep your blood sugar levels under control. Ask your health care provider for other ways to prevent yeast infections. Contact a health care provider if: You have a fever. Your symptoms go away and then return. Your symptoms do not get better with treatment. Your symptoms get worse. You have new symptoms. You develop blisters in or around your vagina. You have blood coming from your vagina and it is not your menstrual period. You develop pain in your abdomen. Summary Vaginal yeast infection is a condition that causes discharge as well as soreness, swelling, and redness (inflammation) of the vagina. This condition is treated with medicine. Medicines may be over-the-counter or prescription. Take or apply over-the-counter and prescription medicines only as told by your health care provider. Do not douche. Resume sexual activity or use of tampons as instructed by your health care provider. Contact a health care provider if your symptoms do not get better with treatment or your symptoms go away and then return. This information is not intended to replace advice given to you by your health care provider. Make sure you discuss any questions you have with your health care provider. Document Revised: 11/29/2020 Document Reviewed: 11/29/2020 Elsevier Patient Education  2024 [employer].

## 2023-07-26 NOTE — Progress Notes (Signed)
Pt reports Vaginal discharge and discomfort. Pt has a hx of UTI's. Pt had recent antibiotic use. Pt reports that the discharge is yellow.

## 2023-07-26 NOTE — Progress Notes (Signed)
Subjective:    Patient ID: Alexis Gordon, female    DOB: [birth year], 27 y.o.   MRN: [id number]  27y/o married hispanic female established patient here for evaluation vaginal discharge/itching and neck itching after taking augmentin.  Denied tongue swelling/dyspnea/dysphagia/dysphasia/n/v/d/f/c.  Some burning with urination and skin irritated perineum.  Vaginal discharge after taking a couple days augmentin white/yellow.  Required antifungal after last antibiotics for pharyngitis/UTI.  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  1 dose remaining to take on augmentin tonight after work.      Review of Systems  Constitutional:  Negative for chills, diaphoresis, fatigue and fever.  HENT:  Negative for congestion, trouble swallowing and voice change.   Eyes:  Negative for photophobia and visual disturbance.  Respiratory:  Negative for cough, choking, chest tightness, shortness of breath, wheezing and stridor.   Cardiovascular:  Negative for chest pain and palpitations.  Gastrointestinal:  Negative for abdominal pain, diarrhea, nausea and vomiting.  Genitourinary:  Positive for dysuria, frequency and vaginal discharge. Negative for decreased urine volume, difficulty urinating, enuresis, flank pain, genital sores, hematuria, menstrual problem, pelvic pain, vaginal bleeding and vaginal pain.  Musculoskeletal:  Negative for arthralgias, back pain, gait problem, joint swelling, myalgias, neck pain and neck stiffness.  Skin:  Positive for rash. Negative for color change, pallor and wound.  Allergic/Immunologic: Positive for environmental allergies.  Neurological:  Negative for dizziness, tremors, seizures, syncope, facial asymmetry, speech difficulty, weakness, light-headedness, numbness and headaches.  Hematological:  Negative for adenopathy. Does not bruise/bleed easily.  Psychiatric/Behavioral:  Negative for agitation, confusion and  sleep disturbance.        Objective:   Physical Exam Vitals and nursing note reviewed.  Constitutional:      General: She is awake. She is not in acute distress.    Appearance: Normal appearance. She is well-developed, well-groomed and overweight. She is not ill-appearing, toxic-appearing or diaphoretic.  HENT:     Head: Normocephalic and atraumatic.     Jaw: There is normal jaw occlusion.     Salivary Glands: Right salivary gland is not diffusely enlarged or tender. Left salivary gland is not diffusely enlarged or tender.     Right Ear: Hearing and external ear normal. No decreased hearing noted.     Left Ear: Hearing and external ear normal. No decreased hearing noted.     Nose: Nose normal. No congestion or rhinorrhea.     Right Turbinates: Not enlarged, swollen or pale.     Left Turbinates: Not enlarged, swollen or pale.     Right Sinus: No maxillary sinus tenderness or frontal sinus tenderness.     Left Sinus: No maxillary sinus tenderness or frontal sinus tenderness.     Mouth/Throat:     Lips: Pink. No lesions.     Mouth: Mucous membranes are moist. No oral lesions or angioedema.     Dentition: No gum lesions.     Tongue: No lesions. Tongue does not deviate from midline.     Palate: No mass and lesions.     Pharynx: Uvula midline. Pharyngeal swelling, posterior oropharyngeal erythema and postnasal drip present. No oropharyngeal exudate or uvula swelling.     Tonsils: No tonsillar exudate or tonsillar abscesses.  Eyes:     General: Lids are normal. Vision grossly intact. Gaze aligned appropriately. Allergic shiner present. No scleral icterus.       Right eye: No discharge.        Left  eye: No discharge.     Extraocular Movements: Extraocular movements intact.     Conjunctiva/sclera: Conjunctivae normal.     Pupils: Pupils are equal, round, and reactive to light.  Neck:     Trachea: Trachea and phonation normal. No abnormal tracheal secretions.  Cardiovascular:     Rate  and Rhythm: Normal rate and regular rhythm.     Pulses: Normal pulses.          Radial pulses are 2+ on the right side and 2+ on the left side.     Heart sounds: Normal heart sounds, S1 normal and S2 normal.  Pulmonary:     Effort: Pulmonary effort is normal.     Breath sounds: Normal breath sounds and air entry. No stridor, decreased air movement or transmitted upper airway sounds. No decreased breath sounds, wheezing or rhonchi.     Comments: Spoke full sentences without difficulty; no cough observed in exam room Abdominal:     General: There is no distension.     Palpations: Abdomen is soft.     Tenderness: There is no right CVA tenderness, left CVA tenderness or guarding.  Genitourinary:    Comments: Pelvic exams prohibited by contract in this clinic deferred Musculoskeletal:        General: Normal range of motion.     Right hand: Normal strength. Normal capillary refill.     Left hand: Normal strength. Normal capillary refill.     Cervical back: Normal range of motion and neck supple. No swelling, edema, deformity, erythema, signs of trauma, lacerations, rigidity, spasms, torticollis, tenderness or crepitus. No pain with movement or muscular tenderness. Normal range of motion.     Thoracic back: No swelling, edema, deformity, signs of trauma, lacerations, spasms or tenderness. Normal range of motion.     Right lower leg: No edema.     Left lower leg: No edema.  Lymphadenopathy:     Head:     Right side of head: No submental, submandibular, tonsillar, preauricular, posterior auricular or occipital adenopathy.     Left side of head: No submental, submandibular, tonsillar, preauricular, posterior auricular or occipital adenopathy.     Cervical: No cervical adenopathy.     Right cervical: No superficial, deep or posterior cervical adenopathy.    Left cervical: No superficial, deep or posterior cervical adenopathy.  Skin:    General: Skin is warm and dry.     Capillary Refill:  Capillary refill takes less than 2 seconds.     Coloration: Skin is not ashen, cyanotic, jaundiced, mottled, pale or sallow.     Findings: Rash present. No abrasion, abscess, acne, bruising, burn, ecchymosis, erythema, signs of injury, laceration, lesion, petechiae or wound. Rash is papular. Rash is not crusting, macular, nodular, purpuric, pustular, scaling, urticarial or vesicular.     Nails: There is no clubbing.     Comments: Anterior neck papular nonerythematous rash; patient frequently rubbing neck "it's itchy'  Neurological:     General: No focal deficit present.     Mental Status: She is alert and oriented to person, place, and time. Mental status is at baseline.     GCS: GCS eye subscore is 4. GCS verbal subscore is 5. GCS motor subscore is 6.     Cranial Nerves: No cranial nerve deficit, dysarthria or facial asymmetry.     Motor: Motor function is intact. No weakness, tremor, atrophy, abnormal muscle tone or seizure activity.     Coordination: Coordination is intact. Coordination normal.  Gait: Gait is intact. Gait normal.     Comments: In/out of chair without difficulty; gait sure and steady in clinic; bilateral hand grasp equal 5/5  Psychiatric:        Attention and Perception: Attention and perception normal.        Mood and Affect: Mood and affect normal.        Speech: Speech normal.        Behavior: Behavior normal. Behavior is cooperative.        Thought Content: Thought content normal.        Cognition and Memory: Cognition and memory normal.        Judgment: Judgment normal.     Patient notified urine culture normal except positive leukocytes expected with treatment for UTI e.coli by urine culture 07/12/23 07/12/23 1310   Result status: Final  Resulting lab: [facility]  Value: Escherichia coli Abnormal   Comment: Cefazolin <=4 ug/mL Cefazolin with an MIC <=16 predicts susceptibility to the oral agents cefaclor, cefdinir, cefpodoxime, cefprozil, cefuroxime,  cephalexin, and loracarbef when used for therapy of uncomplicated urinary tract infections due to E. coli, Klebsiella pneumoniae, and Proteus mirabilis. 50,000-100,000 colony forming units per mL  *Additional information available - comment, narrative, result note    Contains abnormal data Urine Culture Order: [id number] Status: Final result     Visible to patient: Yes (seen)     Next appt: 08/15/2023 at 09:00 AM in [facility] ([facility])     Dx: Dysuria   Specimen Information: Urine  Specimen Comment: Urine  2 Result Notes    Component 2 wk ago  Urine Culture, Routine Final report Abnormal   Organism ID, Bacteria Escherichia coli Abnormal   Comment: Cefazolin <=4 ug/mL Cefazolin with an MIC <=16 predicts susceptibility to the oral agents cefaclor, cefdinir, cefpodoxime, cefprozil, cefuroxime, cephalexin, and loracarbef when used for therapy of uncomplicated urinary tract infections due to E. coli, Klebsiella pneumoniae, and Proteus mirabilis. 50,000-100,000 colony forming units per mL  Antimicrobial Susceptibility Comment  Comment:       ** S = Susceptible; I = Intermediate; R = Resistant **                    P = Positive; N = Negative             MICS are expressed in micrograms per mL    Antibiotic                 RSLT#1    RSLT#2    RSLT#3    RSLT#4 Amoxicillin/Clavulanic Acid    S Ampicillin                     R Cefepime                       S Ceftriaxone                    S Cefuroxime                     S Ciprofloxacin                  R Ertapenem                      S Gentamicin                     S  Imipenem                       S Levofloxacin                   R Meropenem                      S Nitrofurantoin                 S Piperacillin/Tazobactam        S Tetracycline                   R Tobramycin                     S Trimethoprim/Sulfa             R  Resulting Agency [facility]         Narrative Performed by:  Verdell Carmine Performed at:  [address] [facility] [address] [place], [place]  [id number] Lab Director: Jolene Schimke MD, Phone:  [phone]    Specimen Collected: 07/12/23 13:10 Last Resulted: 07/17/23 11:36              Assessment & Plan:   A-dysuria, pruritic dermatitis and vulvovaginal candidiasis  P-poct urinalysis today.  Finish last dose augmentin 875mg  po tonight after work.  Medications as directed. Patient is also to push fluids and may use Pyridium 200mg  po TID as needed. Hydrate, avoid dehydration. Avoid holding urine void on frequent basis every 4 to 6 hours. If unable to void every 8 hours follow up for re-evaluation with PCM, urgent care or ER. Call or return to clinic as needed if these symptoms worsen or fail to improve as anticipated. Exitcare handout on cystitis  Patient verbalized agreement and understanding of treatment plan and had no further questions at this time.  P2: Hydrate and cranberry juice    Most likely tinea neck discussed avoid staying in sweat stained clothes continue selsun blue shampoo in shower prn.  Given calagel may apply QID prn itching.  Ice cube 15 minutes QID prn itching  aquaphor topically prn dry/irritated skin.  Discussed avoid scratching as starts worsening itching/histamine release/skin irritation and can possible result in secondary skin infection. Follow up re-evaluation redness that doesn't resolve, discharge, swelling, pain, dyspnea, dysphagia, dysphasia, shortness of breath, vomiting or diarrhea.  Exitcare handout tinea.  Patient verbalized understanding information/instructions, agreed with plan of care and had no further questions at this time.  Electronic rx diflucan 150mg  po x 1 now and repeat dose in 72 hours if still having discharge/itching/rash #2 RF0 sent to her pharmacy of choice.  Consider yogurt daily and diet with fruits and vegetables and getting 7-8 hours sleep per night.  Consider cotton underwear.  Most likely related to  antibiotic use. Exitcare handout vaginal candidiasis.   If vaginal bleeding/not menses/pain/fever/chills/no resolution of discharge itching in 1 week seek follow up evaluation/re-evaluation.  Patient agreed with plan of care and had no further questions at this time.  Discussed with patient we do not have wet prep or other genital infection testing in this clinic and would need to see PCM/GYN/UC.

## 2023-07-31 NOTE — Telephone Encounter (Signed)
Patient reported calagel worked best for neck itching prn topical and has only taken 1 dose diflucan and helped with vaginal itching and discharge but has not resolved completely she plans to take second dose diflucan today.  Discussed follow up with me in clinic if no resolution or worsening of symptoms.  Patient agreed with plan of care and had no further questions at this time.  Skin warm dry and pink respirations even and unlabored ra gait sure and steady in workcenter spoke full sentences without difficulty patient rubbed anterior neck and stated she would come to clinic later to get some more topical diphenhydramine gel for prn use.

## 2023-08-08 NOTE — Telephone Encounter (Signed)
Patient seen in workcenter stated all vaginal discharge and urinary symptoms resolved after second diflucan dose.  Still having some burning and itching anterior neck skin but calagel topical prn definitely helps.  Saw RN Lurena Joiner yesterday in clinic to get a few more UD calagel also and using today.  Discussed use emollient daily after showering avoid dry skin as can worsen symptoms.  Aquaphor makes skin burn more.  Noted fine papular nonerythematous rash anterior throat and patient frequently rubbing/touching anterior neck.  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  May continue calagel QID prn itching or zyrtec 10mg  po BID prn itching po.  Follow up if redness/swelling/dyspnea/dysphagia/dysphasia.  Patient spoke full sentences without difficulty respirations even and unlabored RA gait sure and steady.  Patient agreed with plan of care and had no further questions at that time.

## 2023-08-09 ENCOUNTER — Encounter: Payer: Self-pay | Admitting: Registered Nurse

## 2023-08-09 ENCOUNTER — Telehealth: Payer: Self-pay | Admitting: Registered Nurse

## 2023-08-09 DIAGNOSIS — L308 Other specified dermatitis: Secondary | ICD-10-CM

## 2023-08-09 DIAGNOSIS — R7401 Elevation of levels of liver transaminase levels: Secondary | ICD-10-CM

## 2023-08-09 NOTE — Telephone Encounter (Signed)
Papular nonerythematous rash dry continues anterior throat using calagel QID prn topical ran out of UD given from clinic here for refill.  Discussed avoid scratching and steam showers.  Seek re-evaluation if new symptoms, spreading of rash/itching/discharge, fever or chills/dyspnea/dysphagia/dysphasia patient agreed with plan of care and had no further questions at this time.  A&Ox3 spoke full sentences without difficulty skin warm dry and pink respirations even and unlabored RA

## 2023-08-14 NOTE — Telephone Encounter (Signed)
Patient reported neck rash/itching has resolved denied further questions or concerns.  Patient notified LFT recheck for elevated level on [facility] labs recommended now also and she may schedule with PCM or [facility] clinic staff Tuesdays or Thursdays.  Order extended 1 month for LFTs.

## 2023-08-15 ENCOUNTER — Other Ambulatory Visit: Payer: BC Managed Care – PPO

## 2023-08-16 ENCOUNTER — Other Ambulatory Visit: Payer: Self-pay | Admitting: Registered Nurse

## 2023-08-16 ENCOUNTER — Other Ambulatory Visit: Payer: No Typology Code available for payment source | Admitting: *Deleted

## 2023-08-16 DIAGNOSIS — R7401 Elevation of levels of liver transaminase levels: Secondary | ICD-10-CM

## 2023-08-16 DIAGNOSIS — E785 Hyperlipidemia, unspecified: Secondary | ICD-10-CM

## 2023-08-16 NOTE — Addendum Note (Signed)
Addended by: Holland Falling on: 08/16/2023 10:51 AM   Modules accepted: Orders

## 2023-08-17 LAB — HEPATIC FUNCTION PANEL
ALT: 95 [IU]/L — ABNORMAL HIGH (ref 0–32)
AST: 43 [IU]/L — ABNORMAL HIGH (ref 0–40)
Albumin: 4.6 g/dL (ref 4.0–5.0)
Alkaline Phosphatase: 58 [IU]/L (ref 44–121)
Bilirubin Total: 0.5 mg/dL (ref 0.0–1.2)
Bilirubin, Direct: 0.15 mg/dL (ref 0.00–0.40)
Total Protein: 7.7 g/dL (ref 6.0–8.5)

## 2023-08-17 LAB — LIPID PANEL
Chol/HDL Ratio: 6 ratio — ABNORMAL HIGH (ref 0.0–4.4)
Cholesterol, Total: 245 mg/dL — ABNORMAL HIGH (ref 100–199)
HDL: 41 mg/dL (ref >39)
LDL Chol Calc (NIH): 168 mg/dL — ABNORMAL HIGH (ref 0–99)
Triglycerides: 194 mg/dL — ABNORMAL HIGH (ref 0–149)
VLDL Cholesterol Cal: 36 mg/dL (ref 5–40)

## 2023-12-19 ENCOUNTER — Telehealth: Payer: Self-pay | Admitting: Registered Nurse

## 2023-12-19 ENCOUNTER — Encounter: Payer: Self-pay | Admitting: Registered Nurse

## 2023-12-19 ENCOUNTER — Other Ambulatory Visit

## 2023-12-19 ENCOUNTER — Other Ambulatory Visit: Payer: Self-pay

## 2023-12-19 DIAGNOSIS — R300 Dysuria: Secondary | ICD-10-CM

## 2023-12-19 LAB — POCT URINALYSIS DIPSTICK
Bilirubin, UA: NEGATIVE
Blood, UA: NEGATIVE
Glucose, UA: NEGATIVE
Ketones, UA: NEGATIVE
Nitrite, UA: NEGATIVE
Protein, UA: POSITIVE — AB
Spec Grav, UA: 1.02 (ref 1.010–1.025)
Urobilinogen, UA: 1 U/dL
pH, UA: 7.5 (ref 5.0–8.0)

## 2023-12-19 NOTE — Telephone Encounter (Signed)
 Patient contacted NP stated having dysuria and strong odor to urine today.  Discussed to see RN Olegario Messier today to give clean catch urine sample will have dipstick urinalysis done in clinic and urine culture sent to [facility] and patient to follow up with NP when onsite tomorrow for appt between 11-2 for physical evaluation.  Patient agreed with plan of care and had no further questions at that time.

## 2023-12-19 NOTE — Progress Notes (Unsigned)
Possible UTI

## 2023-12-19 NOTE — Telephone Encounter (Signed)
 My chart message sent to patient Alexis Gordon, Your urine had some protein again along with white blood cells leukocytes and odor.  Try to drink more water today.  Urine culture will be sent to [facility] if enough sample available.  I would like to see you in clinic tomorrow what time between 12-2 do you prefer?  You can use over the counter azo/pyridium/phenazeprine 100-200mg  by mouth every 8 hours for burning with urination/bladder spasms.  If having bright red blood/clots in urine, fever/chills, unable to pee every 8 hours, worsening belly/back pain or repetitive vomiting today you need to be seen by another provider today. Sincerely, Albina Billet NP-C

## 2023-12-20 ENCOUNTER — Ambulatory Visit: Admitting: Registered Nurse

## 2023-12-20 VITALS — BP 116/80 | HR 78 | Temp 97.2°F | Resp 16

## 2023-12-20 DIAGNOSIS — K5900 Constipation, unspecified: Secondary | ICD-10-CM

## 2023-12-20 DIAGNOSIS — R300 Dysuria: Secondary | ICD-10-CM

## 2023-12-20 DIAGNOSIS — Z630 Problems in relationship with spouse or partner: Secondary | ICD-10-CM

## 2023-12-20 DIAGNOSIS — B3731 Acute candidiasis of vulva and vagina: Secondary | ICD-10-CM

## 2023-12-20 MED ORDER — POLYETHYLENE GLYCOL 3350 17 GM/SCOOP PO POWD: 17.0000 g | Freq: Every day | ORAL | 1 refills | Status: AC | PRN

## 2023-12-20 MED ORDER — FLUCONAZOLE 150 MG PO TABS: ORAL_TABLET | ORAL | 0 refills | Status: DC

## 2023-12-20 MED ORDER — PHENAZOPYRIDINE HCL 200 MG PO TABS
200.0000 mg | ORAL_TABLET | Freq: Three times a day (TID) | ORAL | 0 refills | Status: DC
Start: 2023-12-20 — End: 2024-05-29

## 2023-12-20 NOTE — Progress Notes (Signed)
 Subjective:    Patient ID: Alexis Gordon, female    DOB: [birth year], 28 y.o.   MRN: [id number]  28y/o hispanic established female here for evaluation dysuria, perineal discomfort, abdomen bloating and constipation flare up.  Had stopped home remedy for constipation after recent labs showed elevated AST/ALT.  Has not been drinking as much water due to cooler weather.  Typically drinks more water in the summer.  Typically stool every 2 days but feeling constipated and bloated today and has not taken her OTC remedy concerned may be causing her elevated AST/ALT.  Denied abdomen pain, blood in stool red or black, hematuria, back pain, fever, chills, n/v/d.  In the middle of divorce with spouse and he has been been on purpose changing children's contact information/schedules increasing her workload.  She has not see EAP/used teledoc but considering scheduling appt.  Has tried to eat healthier but not getting 7-8 hours sleep per night.  She did switch to cotton gusset underwear as this has been recurrent dysuria symptoms over the past year.  Has not noted more vaginal discharge.  Smell is different from usual urine.  Has not used OTC azo prn.  Has 1 diflucan remaining at home previously used for vulvovaginal candidiasis.      Review of Systems  Constitutional:  Negative for chills, diaphoresis and fever.  HENT:  Negative for trouble swallowing and voice change.   Eyes:  Negative for photophobia and visual disturbance.  Respiratory:  Negative for cough, shortness of breath, wheezing and stridor.   Gastrointestinal:  Positive for abdominal distention and constipation. Negative for abdominal pain, anal bleeding, blood in stool, diarrhea, nausea and vomiting.  Genitourinary:  Positive for dysuria and vaginal discharge. Negative for decreased urine volume, difficulty urinating, enuresis, flank pain, frequency, genital sores, hematuria, menstrual problem, pelvic pain, urgency, vaginal bleeding and vaginal  pain.  Musculoskeletal:  Negative for back pain, gait problem, neck pain and neck stiffness.  Allergic/Immunologic: Positive for environmental allergies.  Neurological:  Negative for dizziness, tremors, seizures, syncope, facial asymmetry, speech difficulty, weakness, light-headedness, numbness and headaches.  Psychiatric/Behavioral:  Negative for agitation, confusion, sleep disturbance and suicidal ideas.        Objective:   Physical Exam Vitals and nursing note reviewed.  Constitutional:      General: She is awake. She is not in acute distress.    Appearance: Normal appearance. She is well-developed, well-groomed and overweight. She is not ill-appearing, toxic-appearing or diaphoretic.  HENT:     Head: Normocephalic and atraumatic.     Jaw: There is normal jaw occlusion.     Salivary Glands: Right salivary gland is not diffusely enlarged. Left salivary gland is not diffusely enlarged.     Right Ear: Hearing and external ear normal. No decreased hearing noted.     Left Ear: Hearing and external ear normal. No decreased hearing noted.     Nose: Nose normal. No congestion or rhinorrhea.     Mouth/Throat:     Lips: Pink. No lesions.     Mouth: Mucous membranes are moist. No oral lesions or angioedema.     Dentition: No gum lesions.     Tongue: No lesions. Tongue does not deviate from midline.     Palate: No mass and lesions.     Pharynx: Uvula midline. Pharyngeal swelling and postnasal drip present. No oropharyngeal exudate, posterior oropharyngeal erythema or uvula swelling.     Tonsils: No tonsillar exudate.  Eyes:     General: Lids are  normal. Vision grossly intact. Gaze aligned appropriately. Allergic shiner present. No scleral icterus.       Right eye: No discharge.        Left eye: No discharge.     Extraocular Movements: Extraocular movements intact.     Conjunctiva/sclera: Conjunctivae normal.     Pupils: Pupils are equal, round, and reactive to light.  Neck:     Trachea:  Trachea and phonation normal.  Cardiovascular:     Rate and Rhythm: Normal rate and regular rhythm.     Pulses:          Radial pulses are 2+ on the right side and 2+ on the left side.     Heart sounds: Normal heart sounds, S1 normal and S2 normal.  Pulmonary:     Effort: Pulmonary effort is normal. No respiratory distress.     Breath sounds: Normal breath sounds and air entry. No stridor or transmitted upper airway sounds. No wheezing, rhonchi or rales.     Comments: Spoke full sentences without difficulty; no cough observed in exam room Abdominal:     General: Abdomen is flat. Bowel sounds are decreased. There is distension. There is no abdominal bruit. There are no signs of injury.     Palpations: Abdomen is soft. There is no shifting dullness, fluid wave, hepatomegaly, splenomegaly, mass or pulsatile mass.     Tenderness: There is no abdominal tenderness. There is no right CVA tenderness, left CVA tenderness, guarding or rebound. Negative signs include Murphy's sign.     Hernia: No hernia is present.     Comments: Hypoactive bowel sounds x 4 quads; mild distension global noted nontender x 4 quads; dull to percussion x 4 quads; on/off exam table without difficulty standing to sitting to supine and reversed quickly without assistance  Musculoskeletal:        General: Normal range of motion.     Right hand: Normal strength. Normal capillary refill.     Left hand: Normal strength. Normal capillary refill.     Cervical back: Normal range of motion and neck supple. No edema, erythema, signs of trauma, rigidity, torticollis or crepitus. No pain with movement. Normal range of motion.     Right lower leg: No edema.     Left lower leg: No edema.  Lymphadenopathy:     Head:     Right side of head: No submandibular or preauricular adenopathy.     Left side of head: No submandibular or preauricular adenopathy.     Cervical: No cervical adenopathy.     Right cervical: No superficial, deep or  posterior cervical adenopathy.    Left cervical: No superficial, deep or posterior cervical adenopathy.  Skin:    General: Skin is warm and dry.     Capillary Refill: Capillary refill takes less than 2 seconds.     Coloration: Skin is not ashen, cyanotic, jaundiced, mottled, pale or sallow.     Findings: No abrasion, abscess, acne, bruising, burn, ecchymosis, erythema, signs of injury, laceration, lesion, petechiae, rash or wound.     Nails: There is no clubbing.     Comments: Face/neck/hands/forearms visually inspected  Neurological:     General: No focal deficit present.     Mental Status: She is alert and oriented to person, place, and time. Mental status is at baseline.     GCS: GCS eye subscore is 4. GCS verbal subscore is 5. GCS motor subscore is 6.     Cranial Nerves: Cranial nerves 2-12  are intact. No cranial nerve deficit, dysarthria or facial asymmetry.     Motor: Motor function is intact. No weakness, tremor, atrophy, abnormal muscle tone or seizure activity.     Coordination: Coordination is intact. Coordination normal.     Gait: Gait is intact. Gait normal.     Comments: In/out of chair without difficulty; gait sure and steady in clinic; bilateral hand grasp equal 5/5  Psychiatric:        Attention and Perception: Attention and perception normal.        Mood and Affect: Mood and affect normal.        Speech: Speech normal.        Behavior: Behavior normal. Behavior is cooperative.        Thought Content: Thought content normal.        Cognition and Memory: Cognition and memory normal.        Judgment: Judgment normal.      Latest Reference Range & Units 02/14/23 08:17  Sodium 134 - 144 mmol/L 139  Potassium 3.5 - 5.2 mmol/L 4.4  Chloride 96 - 106 mmol/L 102  Glucose 70 - 99 mg/dL 87  BUN 6 - 20 mg/dL 11  Creatinine 4.09 - 8.11 mg/dL 9.14  Calcium 8.7 - 78.2 mg/dL 9.9  BUN/Creatinine Ratio 9 - 23  16  eGFR >59 mL/min/1.73 122  Phosphorus 3.0 - 4.3 mg/dL 3.6   Alkaline Phosphatase 44 - 121 IU/L 54  Albumin 4.0 - 5.0 g/dL 4.9  Albumin/Globulin Ratio 1.2 - 2.2  1.6  Uric Acid 2.6 - 6.2 mg/dL 5.5  AST 0 - 40 IU/L 30  ALT 0 - 32 IU/L 68 (H)  Total Protein 6.0 - 8.5 g/dL 8.0  Total Bilirubin 0.0 - 1.2 mg/dL 0.6  GGT 0 - 60 IU/L 35  Estimated CHD Risk 0.0 - 1.0 times avg. 1.7 (H)  LDH 119 - 226 IU/L 174  Total CHOL/HDL Ratio 0.0 - 4.4 ratio 6.2 (H)  Cholesterol, Total 100 - 199 mg/dL 956 (H)  HDL Cholesterol >39 mg/dL 40  Triglycerides 0 - 213 mg/dL 086  VLDL Cholesterol Cal 5 - 40 mg/dL 25  LDL Chol Calc (NIH) 0 - 99 mg/dL 578 (H)  Iron 27 - 469 ug/dL 629  Vitamin D, 52-WUXLKGM 30.0 - 100.0 ng/mL 14.8 (L)  Globulin, Total 1.5 - 4.5 g/dL 3.1  WBC 3.4 - 01.0 U72Z3/GU 7.1  RBC 3.77 - 5.28 x10E6/uL 4.74  Hemoglobin 11.1 - 15.9 g/dL 44.0  HCT 34.7 - 42.5 % 45.9  MCV 79 - 97 fL 97  MCH 26.6 - 33.0 pg 31.6  MCHC 31.5 - 35.7 g/dL 95.6  RDW 38.7 - 56.4 % 11.9  Platelets 150 - 450 x10E3/uL 229  Neutrophils Not Estab. % 46  Immature Granulocytes Not Estab. % 0  NEUT# 1.4 - 7.0 x10E3/uL 3.3  Lymphs Abs 0.7 - 3.1 x10E3/uL 2.7  Monocytes Absolute 0.1 - 0.9 x10E3/uL 0.3  Basophils Absolute 0.0 - 0.2 x10E3/uL 0.1  Immature Grans (Abs) 0.0 - 0.1 x10E3/uL 0.0  Lymphs Not Estab. % 39  Monocytes Not Estab. % 5  Basos Not Estab. % 1  Eos Not Estab. % 9  EOS (ABSOLUTE) 0.0 - 0.4 x10E3/uL 0.7 (H)  Hemoglobin A1C 4.8 - 5.6 % 5.6  Est. average glucose Bld gHb Est-mCnc mg/dL 332  TSH 9.518 - 8.416 uIU/mL 1.460  Thyroxine (T4) 4.5 - 12.0 ug/dL 7.4  Free Thyroxine Index 1.2 - 4.9  1.9  T3 Uptake Ratio 24 -  39 % 26  (H): Data is abnormally high (L): Data is abnormally low  Latest Reference Range & Units 04/02/23 11:45 07/12/23 13:10  Appearance  cloudy hazy  Bilirubin, UA  neg negative  Clarity, UA  cloudy Pend  Color, UA  amber yellow  Glucose Negative  Negative Negative  Ketones, UA  neg trace  Leukocytes,UA Negative  Trace ! Negative  Nitrite,  UA  neg negative  pH, UA 5.0 - 8.0  6.5 5.0  Protein,UA Negative  Positive ! Negative  Specific Gravity, UA 1.010 - 1.025  1.025 1.015  Urobilinogen, UA 0.2 or 1.0 E.U./dL 0.2 0.2  RBC, UA  trace moderate  !: Data is abnormal       Assessment & Plan:  A-dysuria, constipation unspecified, relationship problem with partner, vulvovaginal candidiasis  P-dipstick urine normal except odor and trace ketones yesterday.  Urine culture still pending from [facility] will send my chart message once results available typically 72 hours.  Electronic rx to her pharmacy of choice Medications as directed. Patient is also to push fluids to keep urine pale yellow clear in toilet and may use Pyridium 200mg  po TID as needed #6 RF0 . Hydrate, avoid dehydration. Avoid holding urine void on frequent basis every 4 to 6 hours. If unable to void every 8 hours follow up for re-evaluation with PCM, urgent care or ER. Call or return to clinic as needed if these symptoms worsen or fail to improve as anticipated. Exitcare handout on dysuria  Patient verbalized agreement and understanding of treatment plan and had no further questions at this time.  P2: Hydrate and cranberry juice    Discussed with patient:High-fiber diet Eat more high-fiber foods, which will help prevent constipation. The best sources of fiber are whole-grain cereals, such as shredded wheat or cereals with bran. Fresh fruit and raw or cooked vegetables, especially asparagus, cabbage, carrots, corn, andbroccoli are other good sources of fiber.  Discussed daily use of laxatives not recommended.  Miralax is not addictive or habit forming.  Agreed with patient I recommend stopping herbal/OTC constipation treatment at this time due to elevated ALT/AST.  Fluids Drink plenty of water. This helps to soften bowel movements so they are easier to pass.  Exercise. Electronic rx miralax 1 capful daily prn to have soft easy stool add to 8oz beverage of choice.  #[id number]  Discussed  titrate may not need daily.  If having urge to defecate do not delay as can worsen constipation and avoid dehydration.  Attempt diet modification. Patient given Exitcare handout on constipation/dietary fiber. Patient agreed with plan of care, verbalized understanding of information/instructions and had no further questions at this time. P2: increase fruits/fiber/whole grains and fluid intake   Electronic rx diflucan 150mg  po x 1 now and repeat dose in 72 hours if still having discharge/itching/rash #2 RF0 sent to her pharmacy of choice. Consider yogurt daily and diet with fruits and vegetables and getting 7-8 hours sleep per night. Consider cotton underwear. Most likely related to antibiotic use. Exitcare handout vaginal candidiasis. If vaginal bleeding/no menses/pain/fever/chills/no resolution of discharge itching in 1 week seek follow up evaluation/re-evaluation. Patient agreed with plan of care and had no further questions at this time. Discussed with patient we do not have wet prep or other genital infection testing in this clinic and would need to see PCM/GYN/UC.   Patient reported divorce ongoing contentious.  Discussed EAP and teledoc available to assist her.  She stated she will schedule with EAP.  Denied further questions  or concerns at this time.

## 2023-12-20 NOTE — Patient Instructions (Addendum)
 Abdominal Bloating: What to Know  Belly bloating is when your belly may feel full, tight, or painful. It may also look bigger or swollen. This is also called abdominal bloating. Common causes of belly bloating include: Swallowing air. Trouble pooping (constipation). Problems digesting food. Lactose intolerance. This means you can't digest lactose normally, a natural sugar in dairy. Celiac disease. This means you can't digest gluten, a protein found in wheat). Slow movement of food in the stomach and small intestine. Eating too much. Irritable bowel syndrome (IBS). This affects the large intestine. Gastroesophageal reflux disease (GERD). This is when stomach acid goes back into the throat. Urinary retention. This is when the bladder can't be emptied all the way. Follow these instructions at home: Eating and drinking Avoid eating too much. Try not to swallow air while talking or eating. Avoid eating while lying down. Avoid foods and drinks that may trigger your bloating. Depending on the cause this may include: Foods that cause gas, such as broccoli, cabbage, cauliflower, and baked beans. Fizzy, or carbonated, drinks. Hard candy. Chewing gum. Dairy. Medicines Take your medicines only as told. Ask your provider if you should take a probiotic. Probiotics have good bacteria or yeast that can help with digestion. General instructions Exercise regularly to help with bloating from gas and any trouble with pooping. You may need to take steps to help treat or prevent trouble pooping, such as: Taking medicines to help you poop. Eating foods high in fiber, like beans, whole grains, and fresh fruits and vegetables. Drinking more fluids as told. Contact a health care provider if: You throw up or feel like you may throw up. You have watery poop, or diarrhea, that doesn't stop. You have belly pain that gets worse and medicines don't help. You are gaining or losing weight unexpectedly. Get help  right away if: You have chest pain. You have trouble breathing. You feel short of breath. You have trouble peeing. You have dark pee or pee that smells bad. You have blood in your poop or dark, tarry poop. These symptoms may be an emergency. Call 911 right away. Do not wait to see if the symptoms will go away. Do not drive yourself to the hospital. This information is not intended to replace advice given to you by your health care provider. Make sure you discuss any questions you have with your health care provider. Document Revised: 05/02/2023 Document Reviewed: 05/02/2023 Elsevier Patient Education  2024 [employer]Constipation, Adult Constipation is when a person has fewer than three bowel movements in a week, has difficulty having a bowel movement, or has stools (feces) that are dry, hard, or larger than normal. Constipation may be caused by an underlying condition. It may become worse with age if a person takes certain medicines and does not take in enough fluids. Follow these instructions at home: Eating and drinking  Eat foods that have a lot of fiber, such as beans, whole grains, and fresh fruits and vegetables. Limit foods that are low in fiber and high in fat and processed sugars, such as fried or sweet foods. These include french fries, hamburgers, cookies, candies, and soda. Drink enough fluid to keep your urine pale yellow. General instructions Exercise regularly or as told by your health care provider. Try to do 150 minutes of moderate exercise each week. Use the bathroom when you have the urge to go. Do not hold it in. Take over-the-counter and prescription medicines only as told by your health care provider. This includes any fiber  supplements. During bowel movements: Practice deep breathing while relaxing the lower abdomen. Practice pelvic floor relaxation. Watch your condition for any changes. Let your health care provider know about them. Keep all follow-up visits as  told by your health care provider. This is important. Contact a health care provider if: You have pain that gets worse. You have a fever. You do not have a bowel movement after 4 days. You vomit. You are not hungry or you lose weight. You are bleeding from the opening between the buttocks (anus). You have thin, pencil-like stools. Get help right away if: You have a fever and your symptoms suddenly get worse. You leak stool or have blood in your stool. Your abdomen is bloated. You have severe pain in your abdomen. You feel dizzy or you faint. Summary Constipation is when a person has fewer than three bowel movements in a week, has difficulty having a bowel movement, or has stools (feces) that are dry, hard, or larger than normal. Eat foods that have a lot of fiber, such as beans, whole grains, and fresh fruits and vegetables. Drink enough fluid to keep your urine pale yellow. Take over-the-counter and prescription medicines only as told by your health care provider. This includes any fiber supplements. This information is not intended to replace advice given to you by your health care provider. Make sure you discuss any questions you have with your health care provider. Document Revised: 07/26/2022 Document Reviewed: 07/26/2022 Elsevier Patient Education  2024 [employer]Dysuria Dysuria is pain or discomfort when you pee. The pain may be felt in your urethra, which is the part of your body that drains pee (urine) from your bladder. The pain may also be felt near your genitals, groin, or in your lower belly or back. You may have to pee often or have the sudden feeling that you need to pee. This condition can affect anyone, but it's more common in females. It can be caused by: A urinary tract infection (UTI). Kidney stones or bladder stones. Some sexually transmitted infections (STIs). Dehydration. This is when there's not enough water in your body. Irritation and swelling in the  vagina. The use of some medicines. The use of some soaps or products with a scent. Follow these instructions at home: Medicines  Take your medicines only as told. Take your antibiotics as told. Do not stop taking them even if you start to feel better. Eating and drinking Drink enough fluid to keep your pee pale yellow. Certain drinks can make the pain worse. Avoid: Drinks with caffeine in them. Tea. Alcohol. In males, alcohol may irritate the prostate. General instructions Watch your condition for any changes, such as color changes in your pee. Pee often. Do not hold your pee for a long time. If you're female, wipe from front to back after you pee or poop. Use each tissue only once when you wipe. Pee after you have sex. If you've had any tests done, it's up to you to get your test results. Ask your health care provider, or the department doing the test, when your results will be ready. Contact a health care provider if: You have a fever or chills. You have pain in your back or sides. You throw up or feel like you may throw up. You have blood in your pee. You're not peeing as often as normal. You feel very weak. Get help right away if: You have very bad pain that doesn't get better with medicine. You're confused. You have a  fast heartbeat while resting. This information is not intended to replace advice given to you by your health care provider. Make sure you discuss any questions you have with your health care provider. Document Revised: 01/16/2023 Document Reviewed: 01/16/2023 Elsevier Patient Education  2024 [employer].Vaginal Yeast Infection, Adult  Vaginal yeast infection is a condition that causes vaginal discharge as well as soreness, swelling, and redness (inflammation) of the vagina. This is a common condition. Some women get this infection frequently. What are the causes? This condition is caused by a change in the normal balance of the yeast (Candida) and normal  bacteria that live in the vagina. This change causes an overgrowth of yeast, which causes the inflammation. What increases the risk? The condition is more likely to develop in women who: Take antibiotic medicines. Have diabetes. Take birth control pills. Are pregnant. Douche often. Have a weak body defense system (immune system). Have been taking steroid medicines for a long time. Frequently wear tight clothing. What are the signs or symptoms? Symptoms of this condition include: White, thick, creamy vaginal discharge. Swelling, itching, redness, and irritation of the vagina. The lips of the vagina (labia) may be affected as well. Pain or a burning feeling while urinating. Pain during sex. How is this diagnosed? This condition is diagnosed based on: Your medical history. A physical exam. A pelvic exam. Your health care provider will examine a sample of your vaginal discharge under a microscope. Your health care provider may send this sample for testing to confirm the diagnosis. How is this treated? This condition is treated with medicine. Medicines may be over-the-counter or prescription. You may be told to use one or more of the following: Medicine that is taken by mouth (orally). Medicine that is applied as a cream (topically). Medicine that is inserted directly into the vagina (suppository). Follow these instructions at home: Take or apply over-the-counter and prescription medicines only as told by your health care provider. Do not use tampons until your health care provider approves. Do not have sex until your infection has cleared. Sex can prolong or worsen your symptoms of infection. Ask your health care provider when it is safe to resume sexual activity. Keep all follow-up visits. This is important. How is this prevented?  Do not wear tight clothes, such as pantyhose or tight pants. Wear breathable cotton underwear. Do not use douches, perfumed soap, creams, or  powders. Wipe from front to back after using the toilet. If you have diabetes, keep your blood sugar levels under control. Ask your health care provider for other ways to prevent yeast infections. Contact a health care provider if: You have a fever. Your symptoms go away and then return. Your symptoms do not get better with treatment. Your symptoms get worse. You have new symptoms. You develop blisters in or around your vagina. You have blood coming from your vagina and it is not your menstrual period. You develop pain in your abdomen. Summary Vaginal yeast infection is a condition that causes discharge as well as soreness, swelling, and redness (inflammation) of the vagina. This condition is treated with medicine. Medicines may be over-the-counter or prescription. Take or apply over-the-counter and prescription medicines only as told by your health care provider. Do not douche. Resume sexual activity or use of tampons as instructed by your health care provider. Contact a health care provider if your symptoms do not get better with treatment or your symptoms go away and then return. This information is not intended to replace advice  given to you by your health care provider. Make sure you discuss any questions you have with your health care provider. Document Revised: 11/29/2020 Document Reviewed: 11/29/2020 Elsevier Patient Education  2024 [employer]Irritation of the Vagina (Vaginitis): What to Know  Vaginitis is irritation and swelling of the vagina. It happens when the usual balance of bacteria and yeast in the vagina changes. This change causes some types to grow too much. This overgrowth leads to vaginitis. What are the causes? Bacteria. Yeast, which is a fungus. A parasite. A virus. Low hormones in the body. This can occur during pregnancy, breastfeeding, or after menopause. What increases the risk? Irritants, such as douches, bubble baths, scented tampons, and feminine  sprays. Antibiotics. Poor hygiene. Wearing tight pants or thong underwear. Some birth control methods, such as diaphragms, vaginal sponges, or spermicides. Having sex without a condom or having sex with more than one person. Infections. Uncontrolled diabetes. What are the signs or symptoms? Abnormal fluid from the vagina. The fluid may be: White, gray, or yellow. Thick, white, and cheesy. Frothy and yellow or green. A bad smell from the vagina. Itching, pain, or swelling in the vagina. Pain during sex. Pain or burning when you pee. How is this diagnosed? This condition is diagnosed based on your symptoms, medical history, and an exam. This may include a pelvic exam. Tests may also be done. Tests may be done to: Check the pH level of your vagina. Check the fluid in your vagina. How is this treated? Treatment will depend on what is causing your vaginitis. Treatment may include: Antibiotics. Antifungal medicines. Medicines to treat symptoms if you have a virus. Your sex partner should also be treated. Estrogen medicines. Medicines to treat allergies. The medicines may be pills or creams. Follow these instructions at home: Lifestyle Keep the area around your vagina clean and dry. Avoid using soap. Rinse the area with water. Until your health care provider says it's okay: Do not douche. Do not use tampons. Use pads, if needed. Do not have sex. Wipe from front to back after going to the bathroom. When the provider says it's okay, practice safe sex. Use condoms. General instructions Take your medicines only as told. If you were given antibiotics, take them as told. Do not stop taking them even if you start to feel better. How is this prevented? Use mild, unscented products. Avoid the following products if they are scented: Sprays. Detergents. Tampons. Products for cleaning the vagina. Soaps or bubble baths. Let air reach your genital area. To do this: Wear cotton  underwear. Do not wear underwear while you sleep. Do not wear tight pants and underwear or pantyhose without a cotton panel. Do not wear thong underwear. Take off any wet clothing, such as bathing suits, as soon as possible. Practice safe sex. Use condoms. Contact a health care provider if: You have pain in the belly or around the pelvis. You have a fever or chills. You have symptoms that last for more than 2-3 days. This information is not intended to replace advice given to you by your health care provider. Make sure you discuss any questions you have with your health care provider. Document Revised: 06/14/2023 Document Reviewed: 01/22/2023 Elsevier Patient Education  2024 [employer].

## 2023-12-21 ENCOUNTER — Encounter: Payer: Self-pay | Admitting: Registered Nurse

## 2023-12-21 LAB — URINE CULTURE

## 2023-12-21 NOTE — Telephone Encounter (Signed)
 See results note 12/21/23 and office visit 12/20/23 note

## 2024-01-23 NOTE — Telephone Encounter (Signed)
 Patient seen in workcenter stated symptoms resolved and she has been trying to continue drinking more water.  Skin warm dry and pink respirations even and unlabored RA gait sure and steady

## 2024-02-14 DIAGNOSIS — Z0000 Encounter for general adult medical examination without abnormal findings: Secondary | ICD-10-CM

## 2024-02-15 ENCOUNTER — Telehealth: Payer: Self-pay | Admitting: Registered Nurse

## 2024-02-15 ENCOUNTER — Ambulatory Visit: Payer: Self-pay | Admitting: Registered Nurse

## 2024-02-15 DIAGNOSIS — E785 Hyperlipidemia, unspecified: Secondary | ICD-10-CM

## 2024-02-15 DIAGNOSIS — R7401 Elevation of levels of liver transaminase levels: Secondary | ICD-10-CM

## 2024-02-15 NOTE — Telephone Encounter (Signed)
 Latest Reference Range & Units 02/14/24 08:10  Sodium 134 - 144 mmol/L 138  Potassium 3.5 - 5.2 mmol/L 4.4  Chloride 96 - 106 mmol/L 102  Glucose 70 - 99 mg/dL 97  BUN 6 - 24 mg/dL 9  Creatinine 1.61 - 0.96 mg/dL 0.45 (L)  Calcium 8.7 - 10.2 mg/dL 9.6  BUN/Creatinine Ratio 9 - 23  16  eGFR >59 mL/min/1.73 111  Phosphorus 3.0 - 4.3 mg/dL 3.2  Alkaline Phosphatase 44 - 121 IU/L 60  Albumin 3.8 - 4.9 g/dL 4.8  Uric Acid 3.0 - 7.2 mg/dL 4.6  AST 0 - 40 IU/L 28  ALT 0 - 32 IU/L 64 (H)  Total Protein 6.0 - 8.5 g/dL 7.8  Total Bilirubin 0.0 - 1.2 mg/dL 0.5  GGT 0 - 60 IU/L 30  Estimated CHD Risk 0.0 - 1.0 times avg. 1.5 (H)  LDH 119 - 226 IU/L 168  Total CHOL/HDL Ratio 0.0 - 4.4 ratio 5.5 (H)  Cholesterol, Total 100 - 199 mg/dL 409 (H)  HDL Cholesterol >39 mg/dL 42  Triglycerides 0 - 811 mg/dL 914 (H)  VLDL Cholesterol Cal 5 - 40 mg/dL 29  LDL Chol Calc (NIH) 0 - 99 mg/dL 782 (H)  Iron 27 - 956 ug/dL 89  Globulin, Total 1.5 - 4.5 g/dL 3.0  WBC 3.4 - 21.3 Y86V7/QI 6.0  RBC 3.77 - 5.28 x10E6/uL 4.73  Hemoglobin 11.1 - 15.9 g/dL 69.6  HCT 29.5 - 28.4 % 46.1  MCV 79 - 97 fL 98 (H)  MCH 26.6 - 33.0 pg 31.5  MCHC 31.5 - 35.7 g/dL 13.2  RDW 44.0 - 10.2 % 12.2  Platelets 150 - 450 x10E3/uL 246  Neutrophils Not Estab. % 47  Immature Granulocytes Not Estab. % 0  NEUT# 1.4 - 7.0 x10E3/uL 2.8  Lymphs Abs 0.7 - 3.1 x10E3/uL 2.7  Monocytes Absolute 0.1 - 0.9 x10E3/uL 0.3  Basophils Absolute 0.0 - 0.2 x10E3/uL 0.0  Immature Grans (Abs) 0.0 - 0.1 x10E3/uL 0.0  Lymphs Not Estab. % 45  Monocytes Not Estab. % 4  Basos Not Estab. % 1  Eos Not Estab. % 3  EOS (ABSOLUTE) 0.0 - 0.4 x10E3/uL 0.2  Hemoglobin A1C 4.8 - 5.6 % 5.4  Est. average glucose Bld gHb Est-mCnc mg/dL 725  TSH 3.664 - 4.034 uIU/mL 2.260  Thyroxine (T4) 4.5 - 12.0 ug/dL 7.1  Free Thyroxine Index 1.2 - 4.9  1.8  T3 Uptake Ratio 24 - 39 % 26  (L): Data is abnormally low (H): Data is abnormally high  Shelagh Derrick, You met  requirements for 2026 [employer] insurance discount I will give HR Tonya [facility] paperwork 02/19/24 for discount starting 25 Jun 2024.  BP less than 135/85 at 116/80 consider dash diet.; A1c less than 7 and LDL greater than 130. Notify RN Thersia Flax if you want printed copy of lab results or results sent electronically to another provider.  Follow up with PCM elevated cholesterol, ALT (liver enzyme improving) and blood pressure. Consider adding more whole grains to your diet e.g. 2 tablespoons chia seeds or ground flax seeds daily, 4 brazil nuts per month not more or less; oatmeal, brown rice, farro. I recommend 15 minutes sunlight on skin daily and cholecalciferol  2,000 units by mouth daily with meal  I recommend exercise 150 minutes per week; dietary fiber 20 grams per day women per up to date; eat whole grains/fruits/vegetables; keep added sugars to less than 100 calories/5 teaspoons for women per [employer]; spot and 3 month  blood sugar, electrolytes, iron, kidney/thyroid function, and complete blood count normal    Repeat fasting labs in 1 year.   Please let us  know if you have further questions or concerns.   Sincerely,   Sherilyn Dings NP-C

## 2024-02-28 ENCOUNTER — Other Ambulatory Visit

## 2024-02-28 DIAGNOSIS — Z0000 Encounter for general adult medical examination without abnormal findings: Secondary | ICD-10-CM

## 2024-02-28 NOTE — Progress Notes (Signed)
 [facility] labs paper work with BP, weight, height and signatures complete. Blood work needed to be repeated per [facility] d/t entered in wrong patient's bday.

## 2024-02-29 ENCOUNTER — Ambulatory Visit: Payer: Self-pay | Admitting: Registered Nurse

## 2024-02-29 LAB — CMP12+LP+TP+TSH+6AC+CBC/D/PLT
ALT: 56 IU/L — ABNORMAL HIGH (ref 0–32)
AST: 29 IU/L (ref 0–40)
Albumin: 4.8 g/dL (ref 4.0–5.0)
Alkaline Phosphatase: 55 IU/L (ref 44–121)
BUN/Creatinine Ratio: 13 (ref 9–23)
BUN: 8 mg/dL (ref 6–20)
Basophils Absolute: 0.1 10*3/uL (ref 0.0–0.2)
Basos: 1 %
Bilirubin Total: 0.6 mg/dL (ref 0.0–1.2)
Calcium: 9.4 mg/dL (ref 8.7–10.2)
Chloride: 102 mmol/L (ref 96–106)
Chol/HDL Ratio: 5.6 ratio — ABNORMAL HIGH (ref 0.0–4.4)
Cholesterol, Total: 228 mg/dL — ABNORMAL HIGH (ref 100–199)
Creatinine, Ser: 0.62 mg/dL (ref 0.57–1.00)
EOS (ABSOLUTE): 0.5 10*3/uL — ABNORMAL HIGH (ref 0.0–0.4)
Eos: 8 %
Estimated CHD Risk: 1.5 times avg. — ABNORMAL HIGH (ref 0.0–1.0)
Free Thyroxine Index: 2.1 (ref 1.2–4.9)
GGT: 35 IU/L (ref 0–60)
Globulin, Total: 2.6 g/dL (ref 1.5–4.5)
Glucose: 88 mg/dL (ref 70–99)
HDL: 41 mg/dL (ref >39)
Hematocrit: 46.3 % (ref 34.0–46.6)
Hemoglobin: 15.2 g/dL (ref 11.1–15.9)
Immature Grans (Abs): 0 10*3/uL (ref 0.0–0.1)
Immature Granulocytes: 0 %
Iron: 98 ug/dL (ref 27–159)
LDH: 170 IU/L (ref 119–226)
LDL Chol Calc (NIH): 164 mg/dL — ABNORMAL HIGH (ref 0–99)
Lymphocytes Absolute: 3 10*3/uL (ref 0.7–3.1)
Lymphs: 42 %
MCH: 31.9 pg (ref 26.6–33.0)
MCHC: 32.8 g/dL (ref 31.5–35.7)
MCV: 97 fL (ref 79–97)
Monocytes Absolute: 0.3 10*3/uL (ref 0.1–0.9)
Monocytes: 5 %
Neutrophils Absolute: 3.2 10*3/uL (ref 1.4–7.0)
Neutrophils: 44 %
Phosphorus: 3.4 mg/dL (ref 3.0–4.3)
Platelets: 220 10*3/uL (ref 150–450)
Potassium: 4.4 mmol/L (ref 3.5–5.2)
RBC: 4.76 x10E6/uL (ref 3.77–5.28)
RDW: 12.2 % (ref 11.7–15.4)
Sodium: 137 mmol/L (ref 134–144)
T3 Uptake Ratio: 27 % (ref 24–39)
T4, Total: 7.6 ug/dL (ref 4.5–12.0)
TSH: 2.21 u[IU]/mL (ref 0.450–4.500)
Total Protein: 7.4 g/dL (ref 6.0–8.5)
Triglycerides: 124 mg/dL (ref 0–149)
Uric Acid: 5.6 mg/dL (ref 2.6–6.2)
VLDL Cholesterol Cal: 23 mg/dL (ref 5–40)
WBC: 7.2 10*3/uL (ref 3.4–10.8)
eGFR: 124 mL/min/{1.73_m2} (ref >59)

## 2024-02-29 LAB — HEMOGLOBIN A1C
Est. average glucose Bld gHb Est-mCnc: 111 mg/dL
Hgb A1c MFr Bld: 5.5 % (ref 4.8–5.6)

## 2024-03-05 ENCOUNTER — Ambulatory Visit: Payer: Self-pay | Admitting: Registered Nurse

## 2024-03-14 NOTE — Telephone Encounter (Signed)
 Per epic read receipt patient read results note 01/29/24

## 2024-04-21 ENCOUNTER — Ambulatory Visit

## 2024-04-21 NOTE — Progress Notes (Unsigned)
 Patient presented to work clinic with female complaints, no urgent needs at this time. Talked through breathable clothing, OTC options. Patient to check back in with clinic NP this week to dicsuss

## 2024-05-15 ENCOUNTER — Ambulatory Visit: Admitting: Registered Nurse

## 2024-05-29 ENCOUNTER — Ambulatory Visit: Payer: Self-pay | Admitting: Registered Nurse

## 2024-05-29 ENCOUNTER — Other Ambulatory Visit: Payer: Self-pay | Admitting: Registered Nurse

## 2024-05-29 ENCOUNTER — Ambulatory Visit: Admitting: Registered Nurse

## 2024-05-29 VITALS — BP 109/88 | HR 87 | Temp 97.8°F

## 2024-05-29 DIAGNOSIS — B3731 Acute candidiasis of vulva and vagina: Secondary | ICD-10-CM

## 2024-05-29 DIAGNOSIS — R300 Dysuria: Secondary | ICD-10-CM

## 2024-05-29 DIAGNOSIS — R809 Proteinuria, unspecified: Secondary | ICD-10-CM

## 2024-05-29 DIAGNOSIS — K5900 Constipation, unspecified: Secondary | ICD-10-CM

## 2024-05-29 DIAGNOSIS — R1030 Lower abdominal pain, unspecified: Secondary | ICD-10-CM

## 2024-05-29 LAB — POCT URINALYSIS DIPSTICK (MANUAL)
Nitrite, UA: NEGATIVE
Poct Bilirubin: NEGATIVE
Poct Blood: NEGATIVE
Poct Glucose: NORMAL mg/dL
Poct Ketones: NEGATIVE
Poct Protein: 30 mg/dL — AB
Poct Urobilinogen: NORMAL mg/dL
Spec Grav, UA: 1.015 (ref 1.010–1.025)
pH, UA: 7 (ref 5.0–8.0)

## 2024-05-29 MED ORDER — LORATADINE 10 MG PO TABS: 10.0000 mg | ORAL_TABLET | Freq: Every day | ORAL | Status: AC | PRN

## 2024-05-29 MED ORDER — AMOXICILLIN-POT CLAVULANATE 875-125 MG PO TABS: 1.0000 | ORAL_TABLET | Freq: Two times a day (BID) | ORAL | Status: AC

## 2024-05-29 MED ORDER — PHENAZOPYRIDINE HCL 200 MG PO TABS
200.0000 mg | ORAL_TABLET | Freq: Three times a day (TID) | ORAL | 0 refills | Status: AC
Start: 2024-05-29 — End: ?

## 2024-05-29 MED ORDER — FLUTICASONE PROPIONATE 50 MCG/ACT NA SUSP: 1.0000 | Freq: Two times a day (BID) | NASAL | Status: AC

## 2024-05-29 MED ORDER — SALINE SPRAY 0.65 % NA SOLN: 2.0000 | NASAL | Status: AC

## 2024-05-29 MED ORDER — FLUCONAZOLE 150 MG PO TABS: 150.0000 mg | ORAL_TABLET | ORAL | 0 refills | Status: AC

## 2024-05-29 NOTE — Progress Notes (Signed)
 Established Patient Office Visit  Subjective   Patient ID: Alexis Gordon, female    DOB: October 27, 1995  Age: 28 y.o. MRN: [id number]  Chief Complaint  Patient presents with   vaginal discharge and burning with urination intermittent    Started again this week and home remedies not helping would like to be tested for UTI    Alexis Gordon is a 28 year old hispanic female who presents with dysuria and vaginal discharge.  She has been experiencing dysuria and vaginal discharge, with a history of recurrent episodes of these symptoms. No gross hematuria, cloudy or smelly urine, fever, or chills. She is hydrating well and is concerned about a possible urinary tract infection.  She is currently involved in a custody battle with her ex-partner for her two children, which has been a significant source of stress. Despite this, she is trying to maintain a healthy diet and aims to get seven to eight hours of sleep each night but typically sleeps less.  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       Review of Systems  Constitutional:  Negative for chills, diaphoresis, fever and malaise/fatigue.  Respiratory:  Negative for cough.   Cardiovascular:  Negative for leg swelling.  Gastrointestinal:  Positive for abdominal pain and constipation. Negative for diarrhea, nausea and vomiting.  Genitourinary:  Positive for dysuria. Negative for flank pain and hematuria.  Musculoskeletal:  Negative for back pain and neck pain.  Skin:  Positive for itching.  Neurological:  Negative for dizziness, weakness and headaches.      Objective:     BP 109/88 (BP Location: Left Arm, Patient Position: Sitting)   Pulse 87   Temp 97.8 F (36.6 C) (Tympanic)   SpO2 99%    Physical Exam Vitals and nursing note reviewed.  Constitutional:       General: She is awake. She is not in acute distress.    Appearance: Normal appearance. She is well-developed, well-groomed and overweight. She is not ill-appearing, toxic-appearing or diaphoretic.  HENT:     Head: Normocephalic and atraumatic.     Jaw: There is normal jaw occlusion.     Salivary Glands: Right salivary gland is not diffusely enlarged. Left salivary gland is not diffusely enlarged.     Right Ear: Hearing and external ear normal. No decreased hearing noted.     Left Ear: Hearing and external ear normal. No decreased hearing noted.     Nose: Nose normal. No congestion or rhinorrhea.     Mouth/Throat:     Lips: Pink. No lesions.     Mouth: Mucous membranes are moist. No oral lesions or angioedema.     Tongue: No lesions. Tongue does not deviate from midline.     Palate: No mass and lesions.     Pharynx: Oropharynx is clear. Uvula midline.  Eyes:     General: Lids are normal. Vision grossly intact. Gaze aligned appropriately. Allergic shiner present. No scleral icterus.       Right eye: No discharge.        Left eye: No discharge.     Extraocular Movements: Extraocular movements intact.     Conjunctiva/sclera: Conjunctivae normal.     Pupils: Pupils are equal, round, and reactive to light.  Neck:     Trachea: Trachea and phonation normal.  Cardiovascular:     Rate and Rhythm: Normal rate and regular rhythm.     Pulses: Normal pulses.          Radial pulses are 2+ on the right side and 2+ on the left side.     Heart sounds: Normal heart sounds, S1 normal and S2 normal.  Pulmonary:     Effort: Pulmonary effort is normal.     Breath sounds: Normal breath sounds and air entry. No stridor or transmitted upper airway sounds. No wheezing.     Comments: Spoke full sentences without difficulty; no cough observed in exam room Abdominal:     General: Abdomen is flat. Bowel sounds are normal. There is no distension.     Palpations: Abdomen is soft. There is no shifting  dullness, fluid wave, hepatomegaly, splenomegaly, mass or pulsatile mass.     Tenderness: There is no abdominal tenderness. There is no right CVA tenderness, left CVA tenderness, guarding or rebound. Negative signs include Murphy's sign.     Hernia: No hernia is present.     Comments: Dull to percussion x 4 quads; normoactive bowel sounds x 4 quads; on/off exam table without difficulty or assist standing to sitting to lying and reversed quickly without difficulty  Musculoskeletal:        General: No swelling, tenderness, deformity or signs of injury. Normal range of motion.     Right hand: Normal strength. Normal capillary refill.     Left hand: Normal strength. Normal capillary refill.     Cervical back: Normal range of motion and neck supple. No swelling, edema, deformity, erythema, signs of trauma, lacerations, rigidity, spasms, torticollis, tenderness or crepitus. No pain with movement. Normal range of motion.     Thoracic back: No swelling, edema, deformity, signs of trauma, lacerations, spasms or tenderness. Normal range of motion.     Right lower leg: No edema.     Left lower leg: No edema.  Lymphadenopathy:     Head:     Right side of head: No submental, submandibular, tonsillar, preauricular, posterior auricular or occipital adenopathy.     Left side of head: No submental, submandibular, tonsillar, preauricular, posterior auricular or occipital adenopathy.     Cervical: No cervical adenopathy.     Right cervical: No superficial, deep or posterior cervical adenopathy.    Left cervical: No superficial, deep or posterior cervical adenopathy.  Skin:    General: Skin is warm and dry.     Capillary Refill: Capillary refill takes less than 2 seconds.     Coloration: Skin is not ashen, cyanotic, jaundiced, mottled, pale or sallow.     Findings: No abrasion, abscess, acne, bruising, burn, ecchymosis, erythema, signs of injury, laceration, lesion, petechiae, rash or wound.     Nails: There is  no clubbing.     Comments: Visually inspected anterior face/neck/hands  Neurological:     General: No focal deficit present.     Mental Status: She is alert and oriented to person, place, and time. Mental status is at baseline.     GCS: GCS eye subscore is 4. GCS verbal subscore is 5. GCS motor subscore is 6.     Cranial Nerves: No cranial nerve deficit, dysarthria or facial asymmetry.     Motor: Motor function is intact. No weakness, tremor, atrophy, abnormal muscle tone or seizure activity.     Coordination: Coordination is intact. Coordination normal.     Gait: Gait is intact. Gait normal.     Comments: In/out  of chair and on/off exam table without difficulty; gait sure and steady in clinic; bilateral hand grasp equal 5/5  Psychiatric:        Attention and Perception: Attention and perception normal.        Mood and Affect: Mood and affect normal.        Speech: Speech normal.        Behavior: Behavior normal. Behavior is cooperative.        Thought Content: Thought content normal.        Cognition and Memory: Cognition and memory normal.        Judgment: Judgment normal.      Results for orders placed or performed in visit on 05/29/24  POCT Urinalysis Dip Manual  Result Value Ref Range   Spec Grav, UA 1.015 1.010 - 1.025   pH, UA 7.0 5.0 - 8.0   Leukocytes, UA Small (1+) (A) Negative   Nitrite, UA Negative Negative   Poct Protein +30 (A) Negative, trace mg/dL   Poct Glucose Normal Normal mg/dL   Poct Ketones Negative Negative   Poct Urobilinogen Normal Normal mg/dL   Poct Bilirubin Negative Negative   Poct Blood Negative Negative, trace      The ASCVD Risk score (Arnett DK, et al., 2019) failed to calculate for the following reasons:   The 2019 ASCVD risk score is only valid for ages 64 to 45    Assessment & Plan:   Problem List Items Addressed This Visit   None Visit Diagnoses       Lower abdominal pain    -  Primary   Relevant Orders   POCT Urinalysis Dip  Manual (Completed)     Constipation, unspecified constipation type         Dysuria       Relevant Orders   Urine Culture     Vulvovaginal candidiasis       Relevant Medications   fluconazole  (DIFLUCAN ) 150 MG tablet     POCT dipstick urinalysis and urine culture sent to [facility].  Protein and leukocytes on dipstick today patient notified otherwise normal.  Discussed with patient drink more water as constipation and protein in urine.  Hydrate to keep urine pale yellow clear and voiding every 2-4 hours while awake.  If repetitive vomiting, back pain/abdomen pain worsening, unable to void every 8 hours or tea/cola colored urine to seek same day re-evaluation with a provider.  May start pyridium  200mg  po TID x 2 days #6 RF0 electronic rx sent to her pharmacy of choice.  Will call if urine culture positive otherwise will be my chart message.  Start augmentin  875mg  po x BID x 7 days if cloudy urine/smell/fever/chills dispensed today from [facility] to patient #20 RF0.  Exitcare handout on proteinuria and dysuria.  Patient agreed with plan of care and had no further questions at this time.  Discussed with patient unable to perform STI testing/pap/pelvic/wet prep in this clinic due to contract limitations.  She has appt scheduled with GYN October patient missed June appt and had to reschedule.  Discussed symptoms candidiasis vaginal and BV with patient  Patient reported previously diflucan  helped symptoms and she would like refill at this time for weekly dosing 150mg x 4 weekly doses po #4 RF0 to treat for chronic candidiasis.  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  Discussed wearing cotton underwear; changing sweat soaked clothes mid shift and changing out of as soon as possible after work.  Patient agreed with plan of care and had no further questions at this time.  Exitcare handouts bacterial vaginosis and vaginal candidiasis  Constipation increase to 1 capful  BID as once daily still feeling constipation/bloated miralax  and mix in 8 oz liquid of choice noncaffinated.  Discussed may need to titrate up and down once having soft stool and bloating subsides.  Patient is working full time and on her feet in warehouse entire shift suspect due to non air conditioned that she is getting dehydrated during summer due to sweat loss.  Monitor urine color and hydrate to keep pale yellow clear and voiding every 2-4 hours in toilet.  Avoid holding off BM when urge hits as can worsen constipation also.  Eat whole grains/fruits and vegetables daily 20 grams.  Patient agreed with plan of care and had no further questions at this time.  Return if symptoms worsen or fail to improve.    Ellouise DELENA Hope, NP

## 2024-05-29 NOTE — Progress Notes (Signed)
 Presents with c/o lower abdominal pressure, burning with urination.  Will see APP.  Will obtain Urine Dip. No known fever, no body aches

## 2024-05-29 NOTE — Patient Instructions (Addendum)
 Dysuria Dysuria is pain or discomfort when you pee. The pain may be felt in your urethra, which is the part of your body that drains pee (urine) from your bladder. The pain may also be felt near your genitals, groin, or in your lower belly or back. You may have to pee often or have the sudden feeling that you need to pee. This condition can affect anyone, but it's more common in females. It can be caused by: A urinary tract infection (UTI). Kidney stones or bladder stones. Some sexually transmitted infections (STIs). Dehydration. This is when there's not enough water in your body. Irritation and swelling in the vagina. The use of some medicines. The use of some soaps or products with a scent. Follow these instructions at home: Medicines  Take your medicines only as told. Take your antibiotics as told. Do not stop taking them even if you start to feel better. Eating and drinking Drink enough fluid to keep your pee pale yellow. Certain drinks can make the pain worse. Avoid: Drinks with caffeine in them. Tea. Alcohol. In males, alcohol may irritate the prostate. General instructions Watch your condition for any changes, such as color changes in your pee. Pee often. Do not hold your pee for a long time. If you're female, wipe from front to back after you pee or poop. Use each tissue only once when you wipe. Pee after you have sex. If you've had any tests done, it's up to you to get your test results. Ask your health care provider, or the department doing the test, when your results will be ready. Contact a health care provider if: You have a fever or chills. You have pain in your back or sides. You throw up or feel like you may throw up. You have blood in your pee. You're not peeing as often as normal. You feel very weak. Get help right away if: You have very bad pain that doesn't get better with medicine. You're confused. You have a fast heartbeat while resting. This information is  not intended to replace advice given to you by your health care provider. Make sure you discuss any questions you have with your health care provider. Document Revised: 01/16/2023 Document Reviewed: 01/16/2023 Elsevier Patient Education  2024 [employer]Vaginal Infection (Bacterial Vaginosis): What to Know  Bacterial vaginosis is an infection of the vagina. It happens when the balance of normal germs (bacteria) in the vagina changes. It's common among females ages 58 to 24. If left untreated, it can increase your risk of getting a sexually transmitted infection (STI). If you're pregnant, you need to get treated right away. This infection can cause a baby to be born early or at a low birth weight. What are the causes? This happens when too many harmful germs grow in the vagina. The exact reason why this happens isn't known. You can't get this infection from toilet seats, bedding, swimming pools, or contact with objects around you. What increases the risk? Having new or multiple sexual partners, or unprotected sex. Douching. Using an intrauterine device (IUD). Smoking. Alcohol and drug abuse. Taking certain antibiotics. Being pregnant. You can get a vaginal infection without being sexually active. However, it most often occurs in sexually active females. What are the signs or symptoms? Some females have no symptoms. If you have symptoms, they may include: [place] or white vaginal discharge. It can be watery or foamy. A fish-like smell, especially after sex or during your menstrual period. Itching in and around the  vagina. Burning or pain with peeing. How is this diagnosed? This infection is diagnosed based on: Your medical history. A physical exam of the vagina. Checking a sample of vaginal fluid for harmful bacteria or uncommon cells. How is this treated? This condition is treated with antibiotics. These may be given as: A pill. A cream for your vagina. A medicine that you put into  your vagina called a suppository. If the infection comes back, you may need more antibiotics. Follow these instructions at home: Medicines Take your medicines only as told. Take or apply your antibiotics as told. Do not stop using them even if you start to feel better. General instructions If you have a female sexual partner, tell her about the infection. She should see her health care provider. Female partners don't need treatment. Avoid sex until treatment is complete. Drink more fluids as told. Keep the area around your vagina and rectum clean. Wash the area daily with warm water. Wipe yourself from front to back after pooping. If you're breastfeeding, talk to your provider about continuing during treatment. How is this prevented? Self-care Do not douche or use vaginal deodorant sprays. Douching can upset the balance of good and harmful bacteria in the vagina, which can cause an infection to happen again. Wear cotton or cotton-lined underwear. Avoid wearing tight pants or pantyhose, especially in the summer. Safe sex Use condoms correctly and every time you have sex. Use dental dams to protect yourself during oral sex. Limit the number of sexual partners. Get tested for STIs. Your sexual partner should also get tested. Drugs and alcohol Do not smoke, vape, or use nicotine or tobacco. Do not use drugs. Limit the amount of alcohol you drink because it can lead to risky sexual behavior. Where to find more information To learn more: Go to TonerPromos.no. Click Health Topics A-Z. Type bacterial vaginosis in the search box. American Sexual Health Association (ASHA): ashasexualhealth.org [place] Department of Health and Health and safety inspector, Office on Women's Health: TravelLess[employer]ca Contact a health care provider if: Your symptoms don't get better, even after treatment. You have more discharge or pain when peeing. You have a fever or chills. You have pain in your belly or pelvis. You have pain  during sex. You have vaginal bleeding between menstrual periods. This information is not intended to replace advice given to you by your health care provider. Make sure you discuss any questions you have with your health care provider. Document Revised: 02/28/2023 Document Reviewed: 02/28/2023 Elsevier Patient Education  2024 [employer] Inc.Vaginal Yeast Infection, Adult  Vaginal yeast infection is a condition that causes vaginal discharge as well as soreness, swelling, and redness (inflammation) of the vagina. This is a common condition. Some women get this infection frequently. What are the causes? This condition is caused by a change in the normal balance of the yeast (Candida) and normal bacteria that live in the vagina. This change causes an overgrowth of yeast, which causes the inflammation. What increases the risk? The condition is more likely to develop in women who: Take antibiotic medicines. Have diabetes. Take birth control pills. Are pregnant. Douche often. Have a weak body defense system (immune system). Have been taking steroid medicines for a long time. Frequently wear tight clothing. What are the signs or symptoms? Symptoms of this condition include: White, thick, creamy vaginal discharge. Swelling, itching, redness, and irritation of the vagina. The lips of the vagina (labia) may be affected as well. Pain or a burning feeling while urinating. Pain during sex. How  is this diagnosed? This condition is diagnosed based on: Your medical history. A physical exam. A pelvic exam. Your health care provider will examine a sample of your vaginal discharge under a microscope. Your health care provider may send this sample for testing to confirm the diagnosis. How is this treated? This condition is treated with medicine. Medicines may be over-the-counter or prescription. You may be told to use one or more of the following: Medicine that is taken by mouth (orally). Medicine that is  applied as a cream (topically). Medicine that is inserted directly into the vagina (suppository). Follow these instructions at home: Take or apply over-the-counter and prescription medicines only as told by your health care provider. Do not use tampons until your health care provider approves. Do not have sex until your infection has cleared. Sex can prolong or worsen your symptoms of infection. Ask your health care provider when it is safe to resume sexual activity. Keep all follow-up visits. This is important. How is this prevented?  Do not wear tight clothes, such as pantyhose or tight pants. Wear breathable cotton underwear. Do not use douches, perfumed soap, creams, or powders. Wipe from front to back after using the toilet. If you have diabetes, keep your blood sugar levels under control. Ask your health care provider for other ways to prevent yeast infections. Contact a health care provider if: You have a fever. Your symptoms go away and then return. Your symptoms do not get better with treatment. Your symptoms get worse. You have new symptoms. You develop blisters in or around your vagina. You have blood coming from your vagina and it is not your menstrual period. You develop pain in your abdomen. Summary Vaginal yeast infection is a condition that causes discharge as well as soreness, swelling, and redness (inflammation) of the vagina. This condition is treated with medicine. Medicines may be over-the-counter or prescription. Take or apply over-the-counter and prescription medicines only as told by your health care provider. Do not douche. Resume sexual activity or use of tampons as instructed by your health care provider. Contact a health care provider if your symptoms do not get better with treatment or your symptoms go away and then return. This information is not intended to replace advice given to you by your health care provider. Make sure you discuss any questions you  have with your health care provider. Document Revised: 11/29/2020 Document Reviewed: 11/29/2020 Elsevier Patient Education  2024 [employer] Inc[employer]  Proteinuria Proteinuria is when there is too much protein in the urine. Proteins help build muscles and bones. They are also needed to fight infections, help the blood clot, and keep body fluids in balance. Too much protein may be a sign of a problem with the kidneys. The kidneys make urine. They also help keep substances like proteins from leaving the blood and ending up in the urine. In some cases, proteinuria may be mild and short-term. In other cases, it may be an early sign of kidney disease. What are the causes? This condition may be caused by kidney damage or stress on the kidneys. Kidney damage Healthy kidneys have filters called glomeruli that keep proteins out of the urine. This condition can happen when the glomeruli are damaged. This may be from: Diabetes. High blood pressure. Injuries or poisons (toxins). Other causes of kidney damage include: Diseases that affect the body's defense system (immune system). These include lupus, rheumatoid arthritis, sarcoidosis, and Goodpasture syndrome. Infections in the kidney or bloodstream. Problems in other parts of the  body that may injure the kidney. These include heart disease. Certain cancers. These include kidney cancer, lymphoma, leukemia, and multiple myeloma. Amyloidosis. This is a disease that causes too many proteins to build up in body tissues. High blood pressure during pregnancy (preeclampsia and eclampsia). Certain medicines. These include NSAIDs, such as ibuprofen . Stress on the kidneys Short-term proteinuria may be caused by conditions that put stress on the kidneys. In most cases, these conditions do not cause kidney damage. They include: Fever. Exposure to cold or heat. Emotional or physical stress. Extreme exercise. Standing for long periods of time. What increases the  risk? You are more likely to develop this condition if: You have certain conditions. These include diabetes, high blood pressure, and heart disease. You have an immune disease, cancer, or other disease that affects your kidneys. You have a family history of kidney disease. You are 46 years of age or older. You are overweight. You are pregnant. You have an infection. What are the signs or symptoms? Mild proteinuria may not cause symptoms. As more proteins enter the urine, symptoms of kidney disease may develop. These may include: Foamy urine. You may also have to urinate more often. Swelling of the face, abdomen, hands, legs, or feet (edema). Sleeping issues or tiredness (fatigue). Dry and itchy skin. Nausea and vomiting. Muscle cramps. Shortness of breath. How is this diagnosed? This condition may be diagnosed with a urine test. You may have this test as part of a routine physical exam. You may also have this test because you have symptoms of kidney disease or risk factors for the disease. You may also have: Blood tests to measure the level of a substance called creatinine in your blood. Levels of creatinine increase with kidney disease. Imaging tests of your kidney, such as a CT scan or ultrasound. These may be done to look for signs of kidney damage. How is this treated? If have mild or short-term proteinuria, you may not need treatment. Your health care provider may show you how to monitor the level of protein in your urine at home. Treatment depends on the cause of your condition. Treatment may include: Changes to your diet and lifestyle. Getting your blood pressure under control. Getting blood sugar under control, if you have diabetes. Managing other conditions you have that affect your kidneys. Giving birth, if you are pregnant. Staying away from medicines that damage your kidneys. In severe cases, kidney disease may need to be treated with medicines or dialysis. Dialysis is when  a machine is used to do the job of the kidneys. Follow these instructions at home: Eating and drinking Follow instructions from your health care provider about what you may eat and drink. Get to, and maintain, a healthy weight. Ask your health care provider about diets that can help. Activity Ask your health care provider what exercise program is best for you. Return to your normal activities as told by your health care provider. Ask your health care provider what activities are safe for you. General instructions Take over-the-counter and prescription medicines only as told by your health care provider. Check your protein levels at home as told by your health care provider. Keep all follow-up visits. Your health care provider will monitor your kidneys. Contact a health care provider if: You have new symptoms. Your symptoms get worse or do not get better. You have back or side pain. You are vomiting or have diarrhea, and you cannot eat or drink anything. You have a fever. Get help right  away if: You have shortness of breath or chest pain. These symptoms may be an emergency. Get help right away. Call 911. Do not wait to see if the symptoms will go away. Do not drive yourself to the hospital. This information is not intended to replace advice given to you by your health care provider. Make sure you discuss any questions you have with your health care provider. Document Revised: 03/31/2022 Document Reviewed: 03/31/2022 Elsevier Patient Education  2024 [employer].

## 2024-05-31 LAB — URINE CULTURE

## 2024-06-01 ENCOUNTER — Ambulatory Visit: Payer: Self-pay | Admitting: Registered Nurse

## 2024-06-02 ENCOUNTER — Other Ambulatory Visit: Payer: Self-pay | Admitting: Registered Nurse

## 2024-06-02 ENCOUNTER — Ambulatory Visit: Payer: Self-pay | Admitting: Registered Nurse

## 2024-06-02 ENCOUNTER — Ambulatory Visit

## 2024-06-02 VITALS — HR 88 | Temp 98.7°F | Resp 17

## 2024-06-02 DIAGNOSIS — R809 Proteinuria, unspecified: Secondary | ICD-10-CM

## 2024-06-02 NOTE — Progress Notes (Signed)
 Returns to clinic with c/o continuing to have urinary frequency/urgency and odor with urination. No fever, no pain. Patient to get clean catch urine for dip and culture

## 2024-06-02 NOTE — Progress Notes (Signed)
 noted

## 2024-06-05 ENCOUNTER — Ambulatory Visit: Payer: Self-pay | Admitting: Registered Nurse

## 2024-06-05 DIAGNOSIS — R300 Dysuria: Secondary | ICD-10-CM

## 2024-06-05 LAB — URINE CULTURE

## 2024-07-03 NOTE — Progress Notes (Signed)
 Patient reported she had appt with GYN.  Wearing cotton underwear now and changed to wet wipe instead of toilet paper and symptoms have resolved.

## 2024-09-16 ENCOUNTER — Ambulatory Visit: Admitting: General Practice

## 2024-09-16 ENCOUNTER — Ambulatory Visit: Payer: Self-pay | Admitting: Registered Nurse

## 2024-09-16 ENCOUNTER — Other Ambulatory Visit: Payer: Self-pay | Admitting: General Practice

## 2024-09-16 VITALS — BP 132/90 | HR 102 | Temp 98.7°F | Resp 18

## 2024-09-16 DIAGNOSIS — R300 Dysuria: Secondary | ICD-10-CM

## 2024-09-16 LAB — POCT URINALYSIS DIPSTICK OB
Bilirubin, UA: NEGATIVE
Blood, UA: NEGATIVE
Glucose, UA: NEGATIVE
Ketones, UA: NEGATIVE
Leukocytes, UA: NEGATIVE
Nitrite, UA: NEGATIVE
POC,PROTEIN,UA: NEGATIVE
Spec Grav, UA: 1.015
Urobilinogen, UA: 0.2 U/dL
pH, UA: 6

## 2024-09-16 NOTE — Progress Notes (Signed)
 Alania presents to the clinic this afternoon with c/o burning with urination. She has not been drinking very many fluids today. She denies any frank bleeding in urine, no itching, and no discharge.   Lindley is hypertensive and tachycardic, but afebrile. RN collaborated with IVAR Hope, NP in clinic to discuss with Ciria the best course of treatment. She is instructed to drink lots of water to get more hydrated and purchase AZO standard OTC from any drug store to assist with burning. Reminded her the AZO will turn urine orange.  Urine obtained and will send to [facility] this afternoon for a culture. No further needs at this time.

## 2024-09-19 ENCOUNTER — Ambulatory Visit: Payer: Self-pay | Admitting: Registered Nurse

## 2024-09-19 LAB — URINE CULTURE

## 2024-09-19 NOTE — Progress Notes (Signed)
No UTI on urine culture
